# Patient Record
Sex: Female | Born: 1940 | Race: White | Hispanic: No | Marital: Married | Smoking: Never smoker
Health system: Southern US, Community
[De-identification: ages and names within clinical notes are randomized; demographics above are authoritative.]

## PROBLEM LIST (undated history)

## (undated) DIAGNOSIS — I10 Essential (primary) hypertension: Secondary | ICD-10-CM

## (undated) DIAGNOSIS — K219 Gastro-esophageal reflux disease without esophagitis: Secondary | ICD-10-CM

## (undated) DIAGNOSIS — D649 Anemia, unspecified: Secondary | ICD-10-CM

## (undated) HISTORY — DX: Gastro-esophageal reflux disease without esophagitis: K21.9

## (undated) HISTORY — PX: PARATHYROIDECTOMY: SHX19

## (undated) HISTORY — DX: Essential (primary) hypertension: I10

## (undated) HISTORY — DX: Anemia, unspecified: D64.9

---

## 2006-05-19 ENCOUNTER — Encounter: Payer: Self-pay | Admitting: Family Medicine

## 2006-05-23 ENCOUNTER — Encounter: Payer: Self-pay | Admitting: Family Medicine

## 2006-06-19 ENCOUNTER — Ambulatory Visit: Payer: Self-pay | Admitting: Gastroenterology

## 2006-06-26 ENCOUNTER — Encounter: Payer: Self-pay | Admitting: Family Medicine

## 2007-01-16 ENCOUNTER — Encounter: Payer: Self-pay | Admitting: Family Medicine

## 2007-03-27 ENCOUNTER — Ambulatory Visit: Payer: Self-pay | Admitting: Oncology

## 2007-04-03 ENCOUNTER — Encounter (HOSPITAL_COMMUNITY): Admission: RE | Admit: 2007-04-03 | Discharge: 2007-07-02 | Payer: Self-pay | Admitting: Oncology

## 2007-04-03 LAB — CBC WITH DIFFERENTIAL (CANCER CENTER ONLY)
BASO#: 0 10*3/uL (ref 0.0–0.2)
Eosinophils Absolute: 0.1 10*3/uL (ref 0.0–0.5)
HCT: 29.6 % — ABNORMAL LOW (ref 34.8–46.6)
HGB: 9.4 g/dL — ABNORMAL LOW (ref 11.6–15.9)
LYMPH%: 20 % (ref 14.0–48.0)
MCH: 23.3 pg — ABNORMAL LOW (ref 26.0–34.0)
MCV: 74 fL — ABNORMAL LOW (ref 81–101)
MONO#: 0.7 10*3/uL (ref 0.1–0.9)
MONO%: 8.3 % (ref 0.0–13.0)
RBC: 4.01 10*6/uL (ref 3.70–5.32)
WBC: 8.1 10*3/uL (ref 3.9–10.0)

## 2007-04-03 LAB — MORPHOLOGY - CHCC SATELLITE: PLT EST ~~LOC~~: INCREASED

## 2007-04-04 LAB — COMPREHENSIVE METABOLIC PANEL
ALT: 15 U/L (ref 0–35)
AST: 21 U/L (ref 0–37)
Calcium: 10.3 mg/dL (ref 8.4–10.5)
Chloride: 107 mEq/L (ref 96–112)
Creatinine, Ser: 0.76 mg/dL (ref 0.40–1.20)
Potassium: 4.8 mEq/L (ref 3.5–5.3)
Sodium: 137 mEq/L (ref 135–145)
Total Protein: 6.6 g/dL (ref 6.0–8.3)

## 2007-04-04 LAB — RETICULOCYTES (CHCC)
ABS Retic: 82.8 10*3/uL (ref 19.0–186.0)
RBC.: 4.14 MIL/uL (ref 3.87–5.11)

## 2007-04-04 LAB — ERYTHROPOIETIN: Erythropoietin: 181 m[IU]/mL — ABNORMAL HIGH (ref 2.6–34.0)

## 2007-04-04 LAB — VITAMIN B12: Vitamin B-12: 379 pg/mL (ref 211–911)

## 2007-04-04 LAB — IRON AND TIBC: TIBC: 526 ug/dL — ABNORMAL HIGH (ref 250–470)

## 2007-04-04 LAB — FERRITIN: Ferritin: 2 ng/mL — ABNORMAL LOW (ref 10–291)

## 2007-04-11 LAB — CBC WITH DIFFERENTIAL (CANCER CENTER ONLY)
Eosinophils Absolute: 0.2 10*3/uL (ref 0.0–0.5)
HCT: 31.5 % — ABNORMAL LOW (ref 34.8–46.6)
LYMPH%: 19.1 % (ref 14.0–48.0)
MCH: 24.2 pg — ABNORMAL LOW (ref 26.0–34.0)
MCV: 76 fL — ABNORMAL LOW (ref 81–101)
MONO#: 0.5 10*3/uL (ref 0.1–0.9)
MONO%: 7.3 % (ref 0.0–13.0)
NEUT%: 70.2 % (ref 39.6–80.0)
Platelets: 426 10*3/uL — ABNORMAL HIGH (ref 145–400)
RDW: 16.8 % — ABNORMAL HIGH (ref 10.5–14.6)
WBC: 6.9 10*3/uL (ref 3.9–10.0)

## 2007-05-16 ENCOUNTER — Ambulatory Visit: Payer: Self-pay | Admitting: Oncology

## 2007-05-17 LAB — CBC WITH DIFFERENTIAL (CANCER CENTER ONLY)
BASO#: 0.1 10*3/uL (ref 0.0–0.2)
EOS%: 1.8 % (ref 0.0–7.0)
HCT: 23 % — ABNORMAL LOW (ref 34.8–46.6)
HGB: 7.2 g/dL — ABNORMAL LOW (ref 11.6–15.9)
LYMPH#: 1.4 10*3/uL (ref 0.9–3.3)
MCHC: 31.4 g/dL — ABNORMAL LOW (ref 32.0–36.0)
MONO#: 1 10*3/uL — ABNORMAL HIGH (ref 0.1–0.9)
NEUT#: 11.6 10*3/uL — ABNORMAL HIGH (ref 1.5–6.5)
RBC: 2.81 10*6/uL — ABNORMAL LOW (ref 3.70–5.32)
WBC: 14.4 10*3/uL — ABNORMAL HIGH (ref 3.9–10.0)

## 2007-05-18 LAB — MANUAL DIFFERENTIAL (CHCC SATELLITE)
ANC (CHCC HP manual diff): 12.4 10*3/uL — ABNORMAL HIGH (ref 1.5–6.7)
LYMPH: 6 % — ABNORMAL LOW (ref 14–48)
MONO: 8 % (ref 0–13)

## 2007-05-21 LAB — TYPE & CROSSMATCH - CHCC SATELLITE

## 2007-05-25 ENCOUNTER — Encounter: Payer: Self-pay | Admitting: Family Medicine

## 2007-06-08 ENCOUNTER — Ambulatory Visit: Payer: Self-pay | Admitting: Orthopedic Surgery

## 2007-07-03 ENCOUNTER — Ambulatory Visit: Payer: Self-pay | Admitting: Oncology

## 2007-07-04 LAB — CBC WITH DIFFERENTIAL (CANCER CENTER ONLY)
EOS%: 2.1 % (ref 0.0–7.0)
LYMPH%: 20 % (ref 14.0–48.0)
MCH: 26.6 pg (ref 26.0–34.0)
MCHC: 31.9 g/dL — ABNORMAL LOW (ref 32.0–36.0)
MONO%: 6.8 % (ref 0.0–13.0)
NEUT#: 4.6 10*3/uL (ref 1.5–6.5)
Platelets: 435 10*3/uL — ABNORMAL HIGH (ref 145–400)
RDW: 14 % (ref 10.5–14.6)

## 2007-07-04 LAB — COMPREHENSIVE METABOLIC PANEL
AST: 12 U/L (ref 0–37)
Alkaline Phosphatase: 81 U/L (ref 39–117)
BUN: 11 mg/dL (ref 6–23)
CO2: 21 mEq/L (ref 19–32)
Chloride: 109 mEq/L (ref 96–112)
Creatinine, Ser: 0.71 mg/dL (ref 0.40–1.20)
Glucose, Bld: 144 mg/dL — ABNORMAL HIGH (ref 70–99)
Sodium: 143 mEq/L (ref 135–145)
Total Bilirubin: 0.2 mg/dL — ABNORMAL LOW (ref 0.3–1.2)

## 2007-07-04 LAB — IRON AND TIBC
%SAT: 4 % — ABNORMAL LOW (ref 20–55)
Iron: 18 ug/dL — ABNORMAL LOW (ref 42–145)
TIBC: 410 ug/dL (ref 250–470)
UIBC: 392 ug/dL

## 2007-07-04 LAB — RETICULOCYTES (CHCC)
ABS Retic: 65.8 10*3/uL (ref 19.0–186.0)
RBC.: 3.87 MIL/uL (ref 3.87–5.11)

## 2007-07-13 ENCOUNTER — Ambulatory Visit (HOSPITAL_COMMUNITY): Admission: RE | Admit: 2007-07-13 | Discharge: 2007-07-13 | Payer: Self-pay | Admitting: Oncology

## 2007-07-13 ENCOUNTER — Encounter (INDEPENDENT_AMBULATORY_CARE_PROVIDER_SITE_OTHER): Payer: Self-pay | Admitting: Interventional Radiology

## 2007-08-02 LAB — CBC WITH DIFFERENTIAL (CANCER CENTER ONLY)
BASO%: 0.3 % (ref 0.0–2.0)
EOS%: 3.1 % (ref 0.0–7.0)
HCT: 27.7 % — ABNORMAL LOW (ref 34.8–46.6)
LYMPH%: 20.8 % (ref 14.0–48.0)
MCHC: 31.1 g/dL — ABNORMAL LOW (ref 32.0–36.0)
MCV: 74 fL — ABNORMAL LOW (ref 81–101)
MONO%: 5.8 % (ref 0.0–13.0)
NEUT%: 70 % (ref 39.6–80.0)
Platelets: 448 10*3/uL — ABNORMAL HIGH (ref 145–400)
RDW: 17.1 % — ABNORMAL HIGH (ref 10.5–14.6)
WBC: 4.8 10*3/uL (ref 3.9–10.0)

## 2007-08-03 LAB — IRON AND TIBC
TIBC: 451 ug/dL (ref 250–470)
UIBC: 441 ug/dL

## 2007-08-03 LAB — RETICULOCYTES (CHCC)
ABS Retic: 56.3 10*3/uL (ref 19.0–186.0)
RBC.: 3.75 MIL/uL — ABNORMAL LOW (ref 3.87–5.11)
Retic Ct Pct: 1.5 % (ref 0.4–3.1)

## 2007-08-06 LAB — SPEP & IFE WITH QIG
Albumin ELP: 58 % (ref 55.8–66.1)
Alpha-2-Globulin: 10.9 % (ref 7.1–11.8)
Beta Globulin: 9.1 % — ABNORMAL HIGH (ref 4.7–7.2)
IgG (Immunoglobin G), Serum: 646 mg/dL — ABNORMAL LOW (ref 694–1618)
Total Protein, Serum Electrophoresis: 6.8 g/dL (ref 6.0–8.3)

## 2007-08-22 ENCOUNTER — Ambulatory Visit: Payer: Self-pay | Admitting: Oncology

## 2007-08-30 LAB — RETICULOCYTES (CHCC)
ABS Retic: 152.9 10*3/uL (ref 19.0–186.0)
RBC.: 3.92 MIL/uL (ref 3.87–5.11)
Retic Ct Pct: 3.9 % — ABNORMAL HIGH (ref 0.4–3.1)

## 2007-08-30 LAB — CBC WITH DIFFERENTIAL (CANCER CENTER ONLY)
BASO%: 0.4 % (ref 0.0–2.0)
EOS%: 2.1 % (ref 0.0–7.0)
LYMPH#: 0.9 10*3/uL (ref 0.9–3.3)
MCHC: 30.6 g/dL — ABNORMAL LOW (ref 32.0–36.0)
MONO#: 0.3 10*3/uL (ref 0.1–0.9)
NEUT#: 6.2 10*3/uL (ref 1.5–6.5)
NEUT%: 81.2 % — ABNORMAL HIGH (ref 39.6–80.0)
Platelets: 527 10*3/uL — ABNORMAL HIGH (ref 145–400)
RDW: 20 % — ABNORMAL HIGH (ref 10.5–14.6)
WBC: 7.6 10*3/uL (ref 3.9–10.0)

## 2007-08-30 LAB — COMPREHENSIVE METABOLIC PANEL
Albumin: 3.9 g/dL (ref 3.5–5.2)
BUN: 12 mg/dL (ref 6–23)
CO2: 20 mEq/L (ref 19–32)
Glucose, Bld: 176 mg/dL — ABNORMAL HIGH (ref 70–99)
Potassium: 4.4 mEq/L (ref 3.5–5.3)
Sodium: 142 mEq/L (ref 135–145)
Total Bilirubin: 0.3 mg/dL (ref 0.3–1.2)
Total Protein: 6.8 g/dL (ref 6.0–8.3)

## 2007-09-06 ENCOUNTER — Ambulatory Visit: Payer: Self-pay | Admitting: Gastroenterology

## 2007-09-06 ENCOUNTER — Encounter: Payer: Self-pay | Admitting: Family Medicine

## 2007-09-27 LAB — CBC WITH DIFFERENTIAL (CANCER CENTER ONLY)
BASO%: 0.6 % (ref 0.0–2.0)
Eosinophils Absolute: 0.1 10*3/uL (ref 0.0–0.5)
MONO#: 0.5 10*3/uL (ref 0.1–0.9)
MONO%: 8.7 % (ref 0.0–13.0)
NEUT#: 4.2 10*3/uL (ref 1.5–6.5)
Platelets: 369 10*3/uL (ref 145–400)
RBC: 4.51 10*6/uL (ref 3.70–5.32)
RDW: 21.6 % — ABNORMAL HIGH (ref 10.5–14.6)
WBC: 6 10*3/uL (ref 3.9–10.0)

## 2007-09-28 LAB — IRON AND TIBC
%SAT: 17 % — ABNORMAL LOW (ref 20–55)
Iron: 62 ug/dL (ref 42–145)
TIBC: 368 ug/dL (ref 250–470)
UIBC: 306 ug/dL

## 2007-09-28 LAB — FERRITIN: Ferritin: 211 ng/mL (ref 10–291)

## 2007-10-31 ENCOUNTER — Ambulatory Visit: Payer: Self-pay | Admitting: Oncology

## 2007-11-01 LAB — CBC WITH DIFFERENTIAL (CANCER CENTER ONLY)
BASO%: 0.5 % (ref 0.0–2.0)
Eosinophils Absolute: 0.1 10*3/uL (ref 0.0–0.5)
LYMPH#: 1.2 10*3/uL (ref 0.9–3.3)
LYMPH%: 27.5 % (ref 14.0–48.0)
MCV: 84 fL (ref 81–101)
MONO#: 0.4 10*3/uL (ref 0.1–0.9)
Platelets: 281 10*3/uL (ref 145–400)
RBC: 5.05 10*6/uL (ref 3.70–5.32)
RDW: 15.7 % — ABNORMAL HIGH (ref 10.5–14.6)
WBC: 4.2 10*3/uL (ref 3.9–10.0)

## 2007-11-01 LAB — IRON AND TIBC
Iron: 72 ug/dL (ref 42–145)
UIBC: 310 ug/dL

## 2008-02-07 ENCOUNTER — Ambulatory Visit: Payer: Self-pay | Admitting: Oncology

## 2008-02-13 LAB — CBC WITH DIFFERENTIAL (CANCER CENTER ONLY)
BASO#: 0.1 10*3/uL (ref 0.0–0.2)
Eosinophils Absolute: 0.2 10*3/uL (ref 0.0–0.5)
HGB: 14.7 g/dL (ref 11.6–15.9)
LYMPH#: 1.6 10*3/uL (ref 0.9–3.3)
MCH: 29.9 pg (ref 26.0–34.0)
MONO#: 0.5 10*3/uL (ref 0.1–0.9)
NEUT#: 4.7 10*3/uL (ref 1.5–6.5)
Platelets: 284 10*3/uL (ref 145–400)
RBC: 4.9 10*6/uL (ref 3.70–5.32)
WBC: 7.1 10*3/uL (ref 3.9–10.0)

## 2008-02-13 LAB — IRON AND TIBC
%SAT: 22 % (ref 20–55)
TIBC: 349 ug/dL (ref 250–470)
UIBC: 271 ug/dL

## 2008-02-13 LAB — FERRITIN: Ferritin: 61 ng/mL (ref 10–291)

## 2008-08-08 ENCOUNTER — Ambulatory Visit: Payer: Self-pay | Admitting: Oncology

## 2008-08-11 LAB — CBC WITH DIFFERENTIAL (CANCER CENTER ONLY)
BASO#: 0.1 10*3/uL (ref 0.0–0.2)
BASO%: 0.9 % (ref 0.0–2.0)
EOS%: 1.5 % (ref 0.0–7.0)
HGB: 15.8 g/dL (ref 11.6–15.9)
LYMPH#: 1.6 10*3/uL (ref 0.9–3.3)
MCHC: 33.6 g/dL (ref 32.0–36.0)
NEUT#: 6.3 10*3/uL (ref 1.5–6.5)
Platelets: 285 10*3/uL (ref 145–400)

## 2008-08-11 LAB — IRON AND TIBC: Iron: 117 ug/dL (ref 42–145)

## 2009-02-10 ENCOUNTER — Ambulatory Visit: Payer: Self-pay | Admitting: Oncology

## 2009-03-18 LAB — CBC WITH DIFFERENTIAL (CANCER CENTER ONLY)
BASO%: 0.9 % (ref 0.0–2.0)
EOS%: 1.6 % (ref 0.0–7.0)
HCT: 46.1 % (ref 34.8–46.6)
LYMPH#: 1.1 10*3/uL (ref 0.9–3.3)
MCHC: 33.2 g/dL (ref 32.0–36.0)
MONO%: 4.2 % (ref 0.0–13.0)
NEUT#: 6.2 10*3/uL (ref 1.5–6.5)
NEUT%: 79.3 % (ref 39.6–80.0)
RDW: 11.5 % (ref 10.5–14.6)

## 2009-03-18 LAB — IRON AND TIBC
%SAT: 19 % — ABNORMAL LOW (ref 20–55)
Iron: 68 ug/dL (ref 42–145)
TIBC: 351 ug/dL (ref 250–470)
UIBC: 283 ug/dL

## 2009-03-18 LAB — FERRITIN: Ferritin: 56 ng/mL (ref 10–291)

## 2009-09-22 ENCOUNTER — Ambulatory Visit: Payer: Self-pay | Admitting: Oncology

## 2009-09-23 LAB — CBC WITH DIFFERENTIAL (CANCER CENTER ONLY)
BASO%: 1.8 % (ref 0.0–2.0)
EOS%: 1.8 % (ref 0.0–7.0)
HCT: 47.5 % — ABNORMAL HIGH (ref 34.8–46.6)
LYMPH%: 22.7 % (ref 14.0–48.0)
MCH: 30.3 pg (ref 26.0–34.0)
MCHC: 33.1 g/dL (ref 32.0–36.0)
MCV: 91 fL (ref 81–101)
NEUT%: 65.8 % (ref 39.6–80.0)
RDW: 12 % (ref 10.5–14.6)

## 2010-03-18 ENCOUNTER — Ambulatory Visit: Payer: Self-pay | Admitting: Oncology

## 2010-05-06 ENCOUNTER — Ambulatory Visit: Payer: Self-pay | Admitting: Oncology

## 2010-08-17 ENCOUNTER — Ambulatory Visit: Payer: Self-pay | Admitting: Oncology

## 2010-08-24 ENCOUNTER — Encounter: Payer: Self-pay | Admitting: Family Medicine

## 2010-08-24 LAB — CBC WITH DIFFERENTIAL (CANCER CENTER ONLY)
BASO%: 0.7 % (ref 0.0–2.0)
Eosinophils Absolute: 0.2 10*3/uL (ref 0.0–0.5)
HCT: 32.5 % — ABNORMAL LOW (ref 34.8–46.6)
LYMPH#: 1.6 10*3/uL (ref 0.9–3.3)
MCV: 90 fL (ref 81–101)
MONO#: 0.8 10*3/uL (ref 0.1–0.9)
Platelets: 360 10*3/uL (ref 145–400)
RBC: 3.63 10*6/uL — ABNORMAL LOW (ref 3.70–5.32)
RDW: 12.8 % (ref 10.5–14.6)
WBC: 9.8 10*3/uL (ref 3.9–10.0)

## 2010-08-24 LAB — FERRITIN: Ferritin: 78 ng/mL (ref 10–291)

## 2010-08-24 LAB — IRON AND TIBC
%SAT: 7 % — ABNORMAL LOW (ref 20–55)
Iron: 29 ug/dL — ABNORMAL LOW (ref 42–145)
TIBC: 388 ug/dL (ref 250–470)

## 2010-08-26 ENCOUNTER — Ambulatory Visit: Payer: Self-pay | Admitting: Family Medicine

## 2010-08-26 DIAGNOSIS — D509 Iron deficiency anemia, unspecified: Secondary | ICD-10-CM

## 2010-08-26 DIAGNOSIS — K219 Gastro-esophageal reflux disease without esophagitis: Secondary | ICD-10-CM

## 2010-08-26 DIAGNOSIS — D500 Iron deficiency anemia secondary to blood loss (chronic): Secondary | ICD-10-CM | POA: Insufficient documentation

## 2010-08-26 DIAGNOSIS — F339 Major depressive disorder, recurrent, unspecified: Secondary | ICD-10-CM | POA: Insufficient documentation

## 2010-10-13 ENCOUNTER — Ambulatory Visit: Payer: Self-pay | Admitting: Family Medicine

## 2010-10-14 ENCOUNTER — Encounter: Payer: Self-pay | Admitting: Family Medicine

## 2010-10-14 LAB — CONVERTED CEMR LAB
CO2: 26 meq/L (ref 19–32)
Calcium: 9.8 mg/dL (ref 8.4–10.5)
Chloride: 102 meq/L (ref 96–112)
Cholesterol: 234 mg/dL — ABNORMAL HIGH (ref 0–200)
Creatinine, Ser: 0.7 mg/dL (ref 0.4–1.2)
Direct LDL: 139.3 mg/dL
Sodium: 136 meq/L (ref 135–145)
Total CHOL/HDL Ratio: 3
VLDL: 40.2 mg/dL — ABNORMAL HIGH (ref 0.0–40.0)

## 2010-10-18 ENCOUNTER — Telehealth: Payer: Self-pay | Admitting: Family Medicine

## 2010-12-24 ENCOUNTER — Ambulatory Visit: Payer: Self-pay | Admitting: Oncology

## 2011-01-18 NOTE — Assessment & Plan Note (Signed)
Summary: NEW PT TO EST/CLE  R/S FROM 08/19/10   Vital Signs:  Patient profile:   70 year old female Height:      59 inches Weight:      200 pounds BMI:     40.54 Temp:     97.8 degrees F oral Pulse rate:   76 / minute Pulse rhythm:   regular BP sitting:   130 / 80  (left arm) Cuff size:   large  Vitals Entered By: Linde Gillis CMA Duncan Dull) Sep 15, 2010 1:57 PM) CC: new patient, establish care   History of Present Illness: 70 yo here to establish care.  iron deficiency anemia- seeing Dr. Park Breed (hematology), does not have those records with her today but she brings in CBC from yesterday which shows a hgb of 11.1 with MCV of 90.  Per pt, has been much, much lower.  She feels much better.  She is currently taking iron supplementation.  depression- has h/o depression.  Pt is a widowed.  Previously on Celexa after her husband died, now not taking anything.  Feels she is much better now, in "the [facility]."  GERD- had recent upper and lower GI, does have have those records today.  Had full GI workup once she was diagnosed with iron deficieny.  Per pt, iron deficiecny due to bleeding ulcers.  Well woman- due for colonscopy, flu shot, FLP.  Preventive Screening-Counseling & Management  Alcohol-Tobacco     Smoking Status: never      Drug Use:  no.    Current Medications (verified): 1)  Tandem 162-115.2 Mg Caps (Ferrous Fum-Iron Polysacch) .... Take One Tablet By Mouth Daily 2)  Prilosec 20 Mg Cpdr (Omeprazole) .... Take One Tablet By Mouth Daily 3)  Multivitamins  Caps (Multiple Vitamin) .... Take One Tablet By Mouth Daily 4)  Fluocinolone Acetonide 0.01 % Crea (Fluocinolone Acetonide) .... Apply To Area Two Times A Day As Needed Itching.  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 Eyes:  Denies blurring. ENT:  Denies difficulty swallowing. CV:  Denies chest pain or discomfort. Resp:  Denies shortness of breath. GI:  Denies abdominal pain and change in bowel habits. GU:  Denies urinary frequency and urinary hesitancy. MS:  Denies joint pain, joint redness, and joint swelling. Derm:  Denies rash. Neuro:  Denies headaches. Psych:  Denies anxiety, depression, mental problems, panic attacks, sense of great danger, suicidal thoughts/plans, thoughts of violence, unusual visions or sounds, and thoughts /plans of harming others. Endo:  Denies cold intolerance and heat intolerance. Heme:  Denies abnormal bruising, fevers, pallor, and skin discoloration.  Physical Exam  General:  alert, well-developed, and well-nourished.   Head:  normocephalic and atraumatic.   Eyes:  vision grossly intact, pupils equal, pupils round, and pupils reactive to light.   Ears:  R ear normal and L ear normal.  Nose:  no external deformity.   Mouth:  good dentition.   Lungs:  Normal respiratory effort, chest expands symmetrically. Lungs are clear to auscultation, no crackles or wheezes. Heart:  Normal rate and regular rhythm. S1 and S2 normal without gallop, murmur, click, rub or other extra sounds. Abdomen:  Bowel sounds positive,abdomen soft and non-tender without masses, organomegaly or hernias noted. Msk:  No deformity or scoliosis noted of thoracic or lumbar spine.   Extremities:  No clubbing, cyanosis, edema, or deformity noted with  normal full range of motion of all joints.   Neurologic:  alert & oriented X3 and gait normal.   Skin:  Intact without suspicious lesions or rashes Psych:  Cognition and judgment appear intact. Alert and cooperative with normal attention span and concentration. No apparent delusions, illusions, hallucinations   Impression & Recommendations:  Problem # 1:  ANEMIA-IRON DEFICIENCY (ICD-280.9) Assessment Unchanged Will need to review records from Dr. Welton Flakes.  CBC stable, will scan into EMR. Her updated medication list for this problem includes:    Tandem 162-115.2 Mg Caps (Ferrous fum-iron polysacch) .Marland Kitchen... Take one tablet by mouth daily  Problem # 2:  DEPRESSION (ICD-311) Assessment: Unchanged Stable off medication at this time.  Problem # 3:  GERD (ICD-530.81) Assessment: Unchanged Stable with Omeprazole, will review GI records once we receive them. Her updated medication list for this problem includes:    Prilosec 20 Mg Cpdr (Omeprazole) .Marland Kitchen... Take one tablet by mouth daily  Complete Medication List: 1)  Tandem 162-115.2 Mg Caps (Ferrous fum-iron polysacch) .... Take one tablet by mouth daily 2)  Prilosec 20 Mg Cpdr (Omeprazole) .... Take one tablet by mouth daily 3)  Multivitamins Caps (Multiple vitamin) .... Take one tablet by mouth daily 4)  Fluocinolone Acetonide 0.01 % Crea (Fluocinolone acetonide) .... Apply to area two times a day as needed itching.  Other Orders: Radiology Referral (Radiology)  Patient Instructions: 1)  Great to meet you. 2)  Please make an appointment to see me one morning for labs and complete physical exam. 3)  Call your insurance company to see if they cover flu shots. Prescriptions: FLUOCINOLONE ACETONIDE 0.01 % CREA (FLUOCINOLONE ACETONIDE) Apply to area two times a day as needed itching.  #30 g x 0   Entered and Authorized by:   Ruthe Mannan MD   Signed by:   Ruthe Mannan MD on 08/26/2010   Method used:   Print then Give to Patient   RxID:    [id number]   Current Allergies (reviewed today): No known allergies   Prevention & Chronic Care Immunizations   Influenza vaccine: Not documented    Tetanus booster: 12/19/2001: historical   Tetanus booster due: 12/20/2011    Pneumococcal vaccine: historical  (09/12/2006)   Pneumococcal vaccine due: None    H. zoster vaccine: 09/12/2006: historical  Colorectal Screening   Hemoccult: Not documented    Colonoscopy: normal  (08/26/2008)   Colonoscopy due: 08/26/2018  Other Screening   Pap smear: Not documented   Pap smear due: Not Indicated    Mammogram: Not documented   Mammogram action/deferral: Ordered  (08/26/2010)   Mammogram due: 08/27/2011    DXA bone density scan: abnormal  (05/23/2006)   DXA scan due: None    Smoking status: never  (08/26/2010)  Lipids   Total Cholesterol: Not documented   LDL: Not documented   LDL Direct: Not documented   HDL: Not documented   Triglycerides: Not documented   Nursing Instructions: Schedule screening mammogram (see order)  TD Result Date:  12/19/2001 TD Result:  historical Pneumovax Result Date:  09/12/2006 Pneumovax Result:  historical Herpes Zoster Result Date:  09/12/2006 Herpes Zoster Result:  historical Colonoscopy Result Date:  08/26/2008 Colonoscopy Result:  normal PAP Next Due:  Not Indicated Mammogram Result Date:  08/26/2008 Mammogram Next Due:  1 yr Bone Density Result Date:  05/23/2006 Bone Density Result:  abnormal

## 2011-01-18 NOTE — Progress Notes (Signed)
Summary: wants cholesterol info mailed  Phone Note Call from Patient Call back at Home Phone [phone]   Caller: Patient Summary of Call: Advised pt of what her lab letter meant.  She is asking that we send diet information to her showing what she should and should not eat to get her trigs down. Initial call taken by: Lowella Petties CMA, AAMA,  October 18, 2010 9:42 AM  Follow-up for Phone Call        Cholesterol information mailed. Follow-up by: Linde Gillis CMA Duncan Dull),  October 18, 2010 10:02 AM

## 2011-01-18 NOTE — Assessment & Plan Note (Signed)
Summary: CHECK UP/MK   Vital Signs:  Patient profile:   70 year old female Height:      59 inches Weight:      149 pounds BMI:     30.20 Temp:     98.3 degrees F oral Pulse rate:   76 / minute Pulse rhythm:   regular BP sitting:   130 / 80  (right arm) Cuff size:   large  Vitals Entered By: Linde Gillis CMA Duncan Dull) (October 13, 2010 8:46 AM) CC: complete physicial   History of Present Illness: 70 yo here for CPX without complaints.    iron deficiency anemia- seeing Dr. Park Breed (hematology),She is currently taking iron supplementation and feels good.  Denies any fatigue, SOB, CP or LE edema.    depression- has h/o depression.  Pt is a widowed.  Previously on Celexa after her husband died, now not taking anything.  Feels she is much better now, in "the [facility] hands."   Well woman- due for bone density, flu shot, FLP.  Scheduled for mammogram tomorrow.  Current Medications (verified): 1)  Tandem 162-115.2 Mg Caps (Ferrous Fum-Iron Polysacch) .... Take One Tablet By Mouth Daily 2)  Prilosec 20 Mg Cpdr (Omeprazole) .... Take One Tablet By Mouth Daily 3)  Multivitamins  Caps (Multiple Vitamin) .... Take One Tablet By Mouth Daily 4)  Fluocinolone Acetonide 0.01 % Crea (Fluocinolone Acetonide) .... Apply To Area Two Times A Day As Needed Itching.  Allergies (verified): No Known Drug Allergies  Past History:  Past Medical History: Last updated: Sep 15, 2010 Anemia-iron deficiency Depression GERD  Past Surgical History: Last updated: 09-15-10 Denies surgical history  Family History: Last updated: 2010/09/15 Mom - died of CVA, DM sister had MI at 27 Dad-  IDDM  Social History: Last updated: 2010-09-15 Retired Widow/Widower Never Smoked Alcohol use-no Drug use-no  Risk Factors: Smoking Status: never (Sep 15, 2010)  Review of Systems      See HPI General:  Denies malaise. Eyes:  Denies blurring. ENT:  Denies difficulty swallowing. CV:  Denies chest pain or  discomfort. Resp:  Denies shortness of breath. GI:  Denies abdominal pain, bloody stools, and change in bowel habits. GU:  Denies dysuria. MS:  Denies joint pain, joint redness, and joint swelling. Derm:  Denies rash. Neuro:  Denies headaches. Psych:  Denies anxiety and depression. Endo:  Denies cold intolerance and heat intolerance. Heme:  Denies abnormal bruising and bleeding.  Physical Exam  General:  alert, well-developed, and well-nourished.   Head:  normocephalic and atraumatic.   Eyes:  vision grossly intact, pupils equal, pupils round, and pupils reactive to light.   Ears:  R ear normal and L ear normal.   Nose:  no external deformity.   Mouth:  good dentition.   Neck:  No deformities, masses, or tenderness noted. Lungs:  Normal respiratory effort, chest expands symmetrically. Lungs are clear to auscultation, no crackles or wheezes. Heart:  Normal rate and regular rhythm. S1 and S2 normal without gallop, murmur, click, rub or other extra sounds. Abdomen:  Bowel sounds positive,abdomen soft and non-tender without masses, organomegaly or hernias noted. Msk:  No deformity or scoliosis noted of thoracic or lumbar spine.   Extremities:  No clubbing, cyanosis, edema, or deformity noted with normal full range of motion of all joints.   Neurologic:  alert & oriented X3 and gait normal.   Skin:  Intact without suspicious lesions or rashes Psych:  Cognition and judgment appear intact. Alert and cooperative with normal attention span and  concentration. No apparent delusions, illusions, hallucinations   Impression & Recommendations:  Problem # 1:  PREVENTIVE HEALTH CARE (ICD-V70.0) Reviewed preventive care protocols, scheduled due services, and updated immunizations Discussed nutrition, exercise, diet, and healthy lifestyle.  FLP, BMET today. Schedule DEXA scan. declined flu shot. Orders: Venipuncture ([id number]) TLB-BMP (Basic Metabolic Panel-BMET) (80048-METABOL)  Problem # 2:   DEPRESSION (ICD-311) Assessment: Unchanged Doing will without meds.    Problem # 3:  ANEMIA-IRON DEFICIENCY (ICD-280.9) Assessment: Unchanged stable, continue Tandem per Dr. Park Breed. Her updated medication list for this problem includes:    Tandem 162-115.2 Mg Caps (Ferrous fum-iron polysacch) .Marland Kitchen... Take one tablet by mouth daily  Complete Medication List: 1)  Tandem 162-115.2 Mg Caps (Ferrous fum-iron polysacch) .... Take one tablet by mouth daily 2)  Prilosec 20 Mg Cpdr (Omeprazole) .... Take one tablet by mouth daily 3)  Multivitamins Caps (Multiple vitamin) .... Take one tablet by mouth daily 4)  Fluocinolone Acetonide 0.01 % Crea (Fluocinolone acetonide) .... Apply to area two times a day as needed itching.  Other Orders: Radiology Referral (Radiology) TLB-Lipid Panel (80061-LIPID)  Patient Instructions: 1)  Please stop by to see Shirlee Limerick on your way out.   Orders Added: 1)  Radiology Referral [Radiology] 2)  TLB-Lipid Panel [80061-LIPID] 3)  Venipuncture [36415] 4)  TLB-BMP (Basic Metabolic Panel-BMET) [80048-METABOL] 5)  Est. Patient 65& > [23762]    Current Allergies (reviewed today): No known allergies   Mammogram Result Date:  10/14/2010  Prevention & Chronic Care Immunizations   Influenza vaccine: Not documented    Tetanus booster: 12/19/2001: historical   Tetanus booster due: 12/20/2011    Pneumococcal vaccine: historical  (09/12/2006)   Pneumococcal vaccine due: None    H. zoster vaccine: 09/12/2006: historical  Colorectal Screening   Hemoccult: Not documented    Colonoscopy: normal  (08/26/2008)   Colonoscopy action/deferral: GI referral  (10/13/2010)   Colonoscopy due: 08/26/2018  Other Screening   Pap smear: Not documented   Pap smear due: Not Indicated    Mammogram: Not documented   Mammogram action/deferral: Ordered  (08/26/2010)   Mammogram due: 08/27/2011    DXA bone density scan: abnormal  (05/23/2006)   DXA bone density  action/deferral: Ordered  (10/13/2010)   DXA scan due: None    Smoking status: never  (08/26/2010)  Lipids   Total Cholesterol: Not documented   Lipid panel action/deferral: Lipid Panel ordered   LDL: Not documented   LDL Direct: Not documented   HDL: Not documented   Triglycerides: Not documented   Nursing Instructions: GI referral for screening colonoscopy (see order) Schedule screening DXA bone density scan (see order)

## 2011-01-18 NOTE — Letter (Signed)
Summary: Generic Letter  [place] [facility]  [address] [place], [place] [address]   Phone: [phone]  Fax: [phone]    10/14/2010  Volanda Napoleon [address] [place], [place]  [address]  Dear Ms. Melanie Bird,    We have received your lab results and Dr. Dayton Martes says that overall your labs looked great.  Triglycerides are too high.  Weight loss (even small amount) can decrease triglycerides.  Decrease added sugars, eliminate trans fats, increase fiber and limit alcohol.  All these changes together can drop triglycerides by almost 50%.  Recheck in 1 year.  Enclosed is a copy of your lab results.       Sincerely,       Linde Gillis CMA (AAMA)for Dr. Ruthe Mannan

## 2011-01-18 NOTE — Letter (Signed)
Summary:  Cancer Center  [facility]   Imported By: Sherian Rein 09/04/2010 09:54:07  _____________________________________________________________________  External Attachment:    Type:   Image     Comment:   External Document

## 2011-10-03 LAB — CBC
HCT: 29.3 — ABNORMAL LOW
Hemoglobin: 9.2 — ABNORMAL LOW
MCV: 83
Platelets: 524 — ABNORMAL HIGH
RBC: 3.53 — ABNORMAL LOW
WBC: 7.4

## 2011-10-03 LAB — PROTIME-INR
INR: 1
Prothrombin Time: 12.9

## 2011-10-03 LAB — CHROMOSOME ANALYSIS, BONE MARROW

## 2011-10-03 LAB — BONE MARROW EXAM

## 2011-12-06 ENCOUNTER — Other Ambulatory Visit: Payer: Self-pay | Admitting: *Deleted

## 2011-12-06 NOTE — Telephone Encounter (Signed)
THIS REQUEST TO DR.KHAN'S NURSE, Garald Braver.

## 2011-12-08 ENCOUNTER — Encounter: Payer: Self-pay | Admitting: *Deleted

## 2011-12-26 ENCOUNTER — Other Ambulatory Visit: Payer: Self-pay | Admitting: Oncology

## 2011-12-26 DIAGNOSIS — D649 Anemia, unspecified: Secondary | ICD-10-CM

## 2011-12-27 ENCOUNTER — Telehealth: Payer: Self-pay | Admitting: *Deleted

## 2011-12-27 NOTE — Telephone Encounter (Signed)
left voice message to inform the patient of the new date and time on 01-12-2012 starting at 12:00pm with the labs

## 2012-01-11 ENCOUNTER — Encounter: Payer: Self-pay | Admitting: *Deleted

## 2012-01-12 ENCOUNTER — Telehealth: Payer: Self-pay | Admitting: Oncology

## 2012-01-12 ENCOUNTER — Other Ambulatory Visit: Payer: PRIVATE HEALTH INSURANCE | Admitting: Lab

## 2012-01-12 ENCOUNTER — Ambulatory Visit (HOSPITAL_BASED_OUTPATIENT_CLINIC_OR_DEPARTMENT_OTHER): Payer: Medicare Other | Admitting: Oncology

## 2012-01-12 ENCOUNTER — Encounter: Payer: Self-pay | Admitting: Oncology

## 2012-01-12 VITALS — BP 139/79 | HR 109 | Temp 98.4°F | Ht 59.5 in | Wt 202.5 lb

## 2012-01-12 DIAGNOSIS — D649 Anemia, unspecified: Secondary | ICD-10-CM

## 2012-01-12 DIAGNOSIS — D509 Iron deficiency anemia, unspecified: Secondary | ICD-10-CM

## 2012-01-12 DIAGNOSIS — K219 Gastro-esophageal reflux disease without esophagitis: Secondary | ICD-10-CM | POA: Diagnosis not present

## 2012-01-12 LAB — CBC WITH DIFFERENTIAL/PLATELET
Basophils Absolute: 0 10*3/uL (ref 0.0–0.1)
EOS%: 1 % (ref 0.0–7.0)
HCT: 33.7 % — ABNORMAL LOW (ref 34.8–46.6)
HGB: 10.6 g/dL — ABNORMAL LOW (ref 11.6–15.9)
LYMPH%: 13.2 % — ABNORMAL LOW (ref 14.0–49.7)
MCH: 28.3 pg (ref 25.1–34.0)
MCHC: 31.4 g/dL — ABNORMAL LOW (ref 31.5–36.0)
MCV: 90.1 fL (ref 79.5–101.0)
NEUT%: 78.9 % — ABNORMAL HIGH (ref 38.4–76.8)
Platelets: 485 10*3/uL — ABNORMAL HIGH (ref 145–400)
lymph#: 1.1 10*3/uL (ref 0.9–3.3)

## 2012-01-12 LAB — COMPREHENSIVE METABOLIC PANEL
AST: 33 U/L (ref 0–37)
BUN: 9 mg/dL (ref 6–23)
Calcium: 9.8 mg/dL (ref 8.4–10.5)
Chloride: 109 mEq/L (ref 96–112)
Creatinine, Ser: 0.6 mg/dL (ref 0.50–1.10)
Total Bilirubin: 0.3 mg/dL (ref 0.3–1.2)

## 2012-01-12 LAB — IRON AND TIBC
Iron: 267 ug/dL — ABNORMAL HIGH (ref 42–145)
UIBC: 177 ug/dL (ref 125–400)

## 2012-01-12 LAB — FERRITIN: Ferritin: 38 ng/mL (ref 10–291)

## 2012-01-12 NOTE — Telephone Encounter (Signed)
S/w tiffany from medical records and she is aware of the new pt referral for the pt to be transferred over to [facility]

## 2012-01-13 ENCOUNTER — Telehealth: Payer: Self-pay | Admitting: Oncology

## 2012-01-13 ENCOUNTER — Ambulatory Visit: Payer: Self-pay | Admitting: Internal Medicine

## 2012-01-13 NOTE — Telephone Encounter (Signed)
Pt appt. With Dr. Lorre Nick at [facility] 01/26/12 @ 2:00. Medical records faxed. Pt is aware. Dr. Madaline Guthrie on medical leave.

## 2012-01-15 NOTE — Progress Notes (Signed)
OFFICE PROGRESS NOTE  CC Melanie Mannan, MD, MD [address] [place] [address], [place] [address]  DIAGNOSIS: 71 yo female with iron deficiency anemia  PRIOR THERAPY: 1. Previous history of parenteral infusions of iron 2. Oral iron as needed  CURRENT THERAPY: patient was begun on oral tandam  INTERVAL HISTORY: Melanie Bird 71 y.o. female returns for follow up visit. Patient was last seen in September 2011. She has been lost follow up. She recently called stating that she was feeling very tired and fatigued,she thought that her blood count had gone down again. We got her started on oral iron. Since start of it she has been feeling better. With less weakness and fatigue. She has not had any bleeding. She denies headches or dizziness. She is accompanied by her daughter. Patient would like to have a referral to  cancer center for her future care. Remainder of 10 point review of systems is negative.  MEDICAL HISTORY: Past Medical History  Diagnosis Date  . Anemia     ALLERGIES:   has no known allergies.  MEDICATIONS:  Current Outpatient Prescriptions  Medication Sig Dispense Refill  . Calcium Carbonate-Vitamin D (CALCIUM-VITAMIN D) 500-200 MG-UNIT per tablet Take 1 tablet by mouth daily.      . ferrous fumarate-iron polysaccharide complex (TANDEM) 162-115.2 MG CAPS Take 1 capsule by mouth daily with breakfast.      . Multiple Vitamin (MULTIVITAMIN) tablet Take 1 tablet by mouth daily.      Marland Kitchen omeprazole (PRILOSEC) 20 MG capsule Take 20 mg by mouth daily.        SURGICAL HISTORY: History reviewed. No pertinent past surgical history.  REVIEW OF SYSTEMS:  Pertinent items are noted in HPI.   PHYSICAL EXAMINATION: General appearance: alert, cooperative and appears stated age Neck: no adenopathy, no carotid bruit, no JVD, supple, symmetrical, trachea midline and thyroid not enlarged, symmetric, no tenderness/mass/nodules Lymph nodes: Cervical, supraclavicular, and  axillary nodes normal. Resp: clear to auscultation bilaterally and normal percussion bilaterally Back: symmetric, no curvature. ROM normal. No CVA tenderness. Cardio: regular rate and rhythm, S1, S2 normal, no murmur, click, rub or gallop GI: soft, non-tender; bowel sounds normal; no masses,  no organomegaly Extremities: extremities normal, atraumatic, no cyanosis or edema Neurologic: Alert and oriented X 3, normal strength and tone. Normal symmetric reflexes. Normal coordination and gait  ECOG PERFORMANCE STATUS: 1 - Symptomatic but completely ambulatory  Blood pressure 139/79, pulse 109, temperature 98.4 F (36.9 C), temperature source Oral, height 4' 11.5" (1.511 m), weight 202 lb 8 oz (91.853 kg).  LABORATORY DATA: Lab Results  Component Value Date   WBC 8.1 01/12/2012   HGB 10.6* 01/12/2012   HCT 33.7* 01/12/2012   MCV 90.1 01/12/2012   PLT 485* 01/12/2012      Chemistry      Component Value Date/Time   NA 142 01/12/2012 1155   K 4.3 01/12/2012 1155   CL 109 01/12/2012 1155   CO2 20 01/12/2012 1155   BUN 9 01/12/2012 1155   CREATININE 0.60 01/12/2012 1155      Component Value Date/Time   CALCIUM 9.8 01/12/2012 1155   ALKPHOS 94 01/12/2012 1155   AST 33 01/12/2012 1155   ALT 30 01/12/2012 1155   BILITOT 0.3 01/12/2012 1155       RADIOGRAPHIC STUDIES:  No results found.  ASSESSMENT: 71 yo with:  1. Iron deficiency anemia 2. GERD   PLAN:  1. Continue oral iron 2. Referral to [facility] (referral done through [facility]) Patient prefers a female physician   All questions were answered. The patient knows to call the clinic with any problems, questions or concerns. We can certainly see the patient much sooner if necessary.  I spent 20 minutes counseling the patient face to face. The total time spent in the appointment was 30 minutes.    Drue Second, MD Medical/Oncology [facility] [phone] (beeper) [phone]  (Office)  01/15/2012, 3:39 PM

## 2012-01-26 ENCOUNTER — Ambulatory Visit: Payer: Self-pay | Admitting: Internal Medicine

## 2012-01-26 LAB — FERRITIN: Ferritin (ARMC): 53 ng/mL (ref 8–388)

## 2012-01-26 LAB — TSH: Thyroid Stimulating Horm: 1.72 u[IU]/mL

## 2012-01-26 LAB — T4, FREE: Free Thyroxine: 0.88 ng/dL (ref 0.76–1.46)

## 2012-02-13 ENCOUNTER — Encounter: Payer: Self-pay | Admitting: Family Medicine

## 2012-02-13 ENCOUNTER — Ambulatory Visit (INDEPENDENT_AMBULATORY_CARE_PROVIDER_SITE_OTHER): Payer: PRIVATE HEALTH INSURANCE | Admitting: Family Medicine

## 2012-02-13 VITALS — BP 144/86 | HR 108 | Temp 97.6°F | Wt 206.0 lb

## 2012-02-13 DIAGNOSIS — E669 Obesity, unspecified: Secondary | ICD-10-CM | POA: Insufficient documentation

## 2012-02-13 DIAGNOSIS — R635 Abnormal weight gain: Secondary | ICD-10-CM

## 2012-02-13 NOTE — Progress Notes (Signed)
71 yo here to discuss weight gain.  Has gained 56 pounds since 09/2010!  Has had a difficult year- lost her sister and son is getting a divorce. Tends to eat when she is sad and anxious. Would like to discuss ways that she can loose weight on her own. Has never tried any diet and does not exercise.   Patient Active Problem List  Diagnoses  . ANEMIA-IRON DEFICIENCY  . DEPRESSION  . GERD  . Weight gain  . Obesity   Past Medical History  Diagnosis Date  . Anemia    No past surgical history on file. History  Substance Use Topics  . Smoking status: Never Smoker   . Smokeless tobacco: Never Used  . Alcohol Use: No   No family history on file. No Known Allergies Current Outpatient Prescriptions on File Prior to Visit  Medication Sig Dispense Refill  . Calcium Carbonate-Vitamin D (CALCIUM-VITAMIN D) 500-200 MG-UNIT per tablet Take 1 tablet by mouth daily.      . ferrous fumarate-iron polysaccharide complex (TANDEM) 162-115.2 MG CAPS Take 1 capsule by mouth daily with breakfast.      . Multiple Vitamin (MULTIVITAMIN) tablet Take 1 tablet by mouth daily.      Marland Kitchen omeprazole (PRILOSEC) 20 MG capsule Take 20 mg by mouth daily.       The PMH, PSH, Social History, Family History, Medications, and allergies have been reviewed in [facility], and have been updated if relevant.  See HPI    Physical exam: BP 144/86  Pulse 108  Temp(Src) 97.6 F (36.4 C) (Oral)  Wt 206 lb (93.441 kg) Wt Readings from Last 3 Encounters:  02/13/12 206 lb (93.441 kg)  01/12/12 202 lb 8 oz (91.853 kg)  10/13/10 149 lb (67.586 kg)     General: alert, well-developed, and well-nourished.  Head: normocephalic and atraumatic.  Psych: Cognition and judgment appear intact. Alert and cooperative with normal attention span and concentration. No apparent delusions, illusions, hallucinations   Assessment and Plan: 1. Obesity  Amb ref to Medical Nutrition Therapy-MNT  Deteriorated. >25 min spent with face to face  with patient, >50% counseling and/or coordinating care. Discussed eat right diet, hand out given. Will also refer to nutritionist. The patient indicates understanding of these issues and agrees with the plan.

## 2012-02-13 NOTE — Patient Instructions (Signed)
Great to see you. Please stop by to see Marion on your way out.   

## 2012-02-14 ENCOUNTER — Other Ambulatory Visit: Payer: Self-pay | Admitting: *Deleted

## 2012-02-14 MED ORDER — OMEPRAZOLE 20 MG PO CPDR: 20.0000 mg | DELAYED_RELEASE_CAPSULE | Freq: Every day | ORAL | Status: DC

## 2012-02-14 MED ORDER — FERROUS FUM-IRON POLYSACCH 162-115.2 MG PO CAPS: 1.0000 | ORAL_CAPSULE | Freq: Every day | ORAL | Status: DC

## 2012-02-14 NOTE — Telephone Encounter (Signed)
Patient is requesting a Rx for Omeprazole.  She stated that she thinks she can get this Rx cheaper if she has a Rx for it.  Please advise.  [facility] [facility].

## 2012-02-16 LAB — CANCER CENTER HEMOGLOBIN: HGB: 12.5 g/dL (ref 12.0–16.0)

## 2012-02-17 ENCOUNTER — Ambulatory Visit: Payer: Self-pay | Admitting: Internal Medicine

## 2012-03-21 ENCOUNTER — Ambulatory Visit: Payer: Self-pay | Admitting: Internal Medicine

## 2012-03-26 ENCOUNTER — Other Ambulatory Visit: Payer: Self-pay

## 2012-03-26 NOTE — Telephone Encounter (Signed)
Pt called to get refills on Tandem and omprazole sent to [facility]. Advised pt to ck with [facility] that she should have refills on both meds.

## 2012-04-18 ENCOUNTER — Ambulatory Visit: Payer: Self-pay | Admitting: Internal Medicine

## 2012-07-02 ENCOUNTER — Ambulatory Visit: Payer: Self-pay | Admitting: Internal Medicine

## 2012-07-02 LAB — CANCER CENTER HEMOGLOBIN: HGB: 13.9 g/dL

## 2012-07-19 ENCOUNTER — Ambulatory Visit: Payer: Self-pay | Admitting: Internal Medicine

## 2012-10-11 ENCOUNTER — Other Ambulatory Visit: Payer: Self-pay | Admitting: Family Medicine

## 2012-12-19 ENCOUNTER — Ambulatory Visit: Payer: Self-pay | Admitting: Internal Medicine

## 2013-01-19 ENCOUNTER — Ambulatory Visit: Payer: Self-pay | Admitting: Internal Medicine

## 2013-02-16 ENCOUNTER — Emergency Department: Payer: Self-pay | Admitting: Internal Medicine

## 2013-02-16 DIAGNOSIS — K219 Gastro-esophageal reflux disease without esophagitis: Secondary | ICD-10-CM | POA: Diagnosis not present

## 2013-02-16 DIAGNOSIS — J069 Acute upper respiratory infection, unspecified: Secondary | ICD-10-CM | POA: Diagnosis not present

## 2013-02-16 DIAGNOSIS — Z79899 Other long term (current) drug therapy: Secondary | ICD-10-CM | POA: Diagnosis not present

## 2013-02-16 DIAGNOSIS — D649 Anemia, unspecified: Secondary | ICD-10-CM | POA: Diagnosis not present

## 2013-02-16 DIAGNOSIS — R05 Cough: Secondary | ICD-10-CM | POA: Diagnosis not present

## 2013-02-16 LAB — RAPID INFLUENZA A&B ANTIGENS

## 2013-02-19 ENCOUNTER — Ambulatory Visit: Payer: Self-pay | Admitting: Internal Medicine

## 2013-05-19 ENCOUNTER — Ambulatory Visit: Payer: Self-pay | Admitting: Internal Medicine

## 2013-05-19 DIAGNOSIS — D509 Iron deficiency anemia, unspecified: Secondary | ICD-10-CM | POA: Diagnosis not present

## 2013-05-19 DIAGNOSIS — Z79899 Other long term (current) drug therapy: Secondary | ICD-10-CM | POA: Diagnosis not present

## 2013-05-24 ENCOUNTER — Other Ambulatory Visit: Payer: Self-pay | Admitting: Family Medicine

## 2013-06-18 ENCOUNTER — Ambulatory Visit: Payer: Self-pay | Admitting: Internal Medicine

## 2013-06-18 DIAGNOSIS — D509 Iron deficiency anemia, unspecified: Secondary | ICD-10-CM | POA: Diagnosis not present

## 2013-06-18 DIAGNOSIS — Z79899 Other long term (current) drug therapy: Secondary | ICD-10-CM | POA: Diagnosis not present

## 2013-06-26 ENCOUNTER — Other Ambulatory Visit: Payer: Self-pay | Admitting: Family Medicine

## 2013-06-27 ENCOUNTER — Encounter: Payer: Self-pay | Admitting: Family Medicine

## 2013-06-27 ENCOUNTER — Ambulatory Visit (INDEPENDENT_AMBULATORY_CARE_PROVIDER_SITE_OTHER): Payer: Medicare Other | Admitting: Family Medicine

## 2013-06-27 VITALS — BP 142/90 | HR 72 | Temp 98.1°F | Wt 197.0 lb

## 2013-06-27 DIAGNOSIS — D509 Iron deficiency anemia, unspecified: Secondary | ICD-10-CM | POA: Diagnosis not present

## 2013-06-27 DIAGNOSIS — J069 Acute upper respiratory infection, unspecified: Secondary | ICD-10-CM | POA: Insufficient documentation

## 2013-06-27 MED ORDER — TANDEM PLUS 162-115.2-1 MG PO CAPS: ORAL_CAPSULE | ORAL | Status: DC

## 2013-06-27 NOTE — Progress Notes (Signed)
SUBJECTIVE:  Melanie Bird is a 72 y.o. female who complains of coryza, congestion and productive cough for 3 days. She denies a history of anorexia, chest pain, chills, dizziness, fatigue, fevers, myalgias, nausea, shortness of breath, sweats, vomiting, weakness and weight loss and denies a history of asthma. Patient denies smoke cigarettes.  She also has h/o iron deficiency anemia, now followed by Dr. Toma Deiters.  She would like for me to refill her Tandem today.  Patient Active Problem List   Diagnosis Date Noted  . Acute upper respiratory infections of unspecified site 06/27/2013  . Weight gain 02/13/2012  . Obesity 02/13/2012  . ANEMIA-IRON DEFICIENCY 08/26/2010  . DEPRESSION 08/26/2010  . GERD 08/26/2010   Past Medical History  Diagnosis Date  . Anemia    No past surgical history on file. History  Substance Use Topics  . Smoking status: Never Smoker   . Smokeless tobacco: Never Used  . Alcohol Use: No   No family history on file. No Known Allergies Current Outpatient Prescriptions on File Prior to Visit  Medication Sig Dispense Refill  . Calcium Carbonate-Vitamin D (CALCIUM-VITAMIN D) 500-200 MG-UNIT per tablet Take 1 tablet by mouth daily.      . Multiple Vitamin (MULTIVITAMIN) tablet Take 1 tablet by mouth daily.      Marland Kitchen omeprazole (PRILOSEC) 20 MG capsule Take 1 capsule (20 mg total) by mouth daily.  30 capsule  6  . Polysaccharide Iron Complex (FERREX 150 PO) Take by mouth as directed.       No current facility-administered medications on file prior to visit.   The PMH, PSH, Social History, Family History, Medications, and allergies have been reviewed in [facility], and have been updated if relevant.    OBJECTIVE: BP 142/90  Pulse 72  Temp(Src) 98.1 F (36.7 C)  Wt 197 lb (89.359 kg)  BMI 39.14 kg/m2  She appears well, vital signs are as noted. Ears normal.  Throat and pharynx normal.  Neck supple. No adenopathy in the neck. Nose is congested. Sinuses non tender. The chest  is clear, without wheezes or rales.  ASSESSMENT:  viral upper respiratory illness  PLAN: Symptomatic therapy suggested: push fluids, rest and return office visit prn if symptoms persist or worsen. Lack of antibiotic effectiveness discussed with her. Call or return to clinic prn if these symptoms worsen or fail to improve as anticipated.

## 2013-06-27 NOTE — Patient Instructions (Addendum)
Please try some Mucinex over the counter for next 2-3 days. If your symptoms progress, please let me know immediately.

## 2013-07-05 LAB — OCCULT BLOOD X 1 CARD TO LAB, STOOL
Occult Blood, Feces: NEGATIVE
Occult Blood, Feces: NEGATIVE

## 2013-07-19 ENCOUNTER — Ambulatory Visit: Payer: Self-pay | Admitting: Internal Medicine

## 2013-07-19 DIAGNOSIS — Z79899 Other long term (current) drug therapy: Secondary | ICD-10-CM | POA: Diagnosis not present

## 2013-07-19 DIAGNOSIS — D509 Iron deficiency anemia, unspecified: Secondary | ICD-10-CM | POA: Diagnosis not present

## 2013-07-24 DIAGNOSIS — D509 Iron deficiency anemia, unspecified: Secondary | ICD-10-CM | POA: Diagnosis not present

## 2013-07-24 DIAGNOSIS — Z79899 Other long term (current) drug therapy: Secondary | ICD-10-CM | POA: Diagnosis not present

## 2013-08-19 ENCOUNTER — Ambulatory Visit: Payer: Self-pay | Admitting: Family Medicine

## 2013-08-19 ENCOUNTER — Ambulatory Visit: Payer: Self-pay | Admitting: Internal Medicine

## 2013-09-04 ENCOUNTER — Other Ambulatory Visit: Payer: Self-pay | Admitting: *Deleted

## 2013-09-04 MED ORDER — TANDEM PLUS 162-115.2-1 MG PO CAPS: ORAL_CAPSULE | ORAL | Status: DC

## 2013-09-13 DIAGNOSIS — Z23 Encounter for immunization: Secondary | ICD-10-CM | POA: Diagnosis not present

## 2013-10-08 ENCOUNTER — Ambulatory Visit: Payer: Medicare Other | Admitting: Family Medicine

## 2013-10-16 ENCOUNTER — Encounter: Payer: Self-pay | Admitting: Internal Medicine

## 2013-10-16 ENCOUNTER — Ambulatory Visit (INDEPENDENT_AMBULATORY_CARE_PROVIDER_SITE_OTHER): Payer: Medicare Other | Admitting: Internal Medicine

## 2013-10-16 VITALS — BP 140/90 | HR 131 | Temp 98.8°F | Wt 190.5 lb

## 2013-10-16 DIAGNOSIS — D509 Iron deficiency anemia, unspecified: Secondary | ICD-10-CM

## 2013-10-16 NOTE — Patient Instructions (Signed)

## 2013-10-16 NOTE — Assessment & Plan Note (Signed)
Will check CBC and iron panel Will forward to Dr. Eloise Levels for review

## 2013-10-16 NOTE — Addendum Note (Signed)
Addended by: Alvina Chou on: 10/16/2013 03:58 PM   Modules accepted: Orders

## 2013-10-16 NOTE — Addendum Note (Signed)
Addended by: Alvina Chou on: 10/16/2013 03:29 PM   Modules accepted: Orders

## 2013-10-16 NOTE — Progress Notes (Signed)
Subjective:    Patient ID: Melanie Bird, female    DOB: [birth year], 72 y.o.   MRN: [id number]  HPI  Pt presents to the clinic today for a follow up. She does not know what she is following up for. She thought Dr. Eloise Levels had sent her over here for lab work. He is the provider her does her physicals and manages her iron deficiency anemia. She does reports intermittent fatigue, better some days than others. She denies chest pain or shortness of breath. She is taking Ferrex and Tandem Plus daily as prescribed. She would like her labs sent to Dr. Eloise Levels.  Review of Systems  Past Medical History  Diagnosis Date  . Anemia     Current Outpatient Prescriptions  Medication Sig Dispense Refill  . Calcium Carbonate-Vitamin D (CALCIUM-VITAMIN D) 500-200 MG-UNIT per tablet Take 1 tablet by mouth daily.      Marland Kitchen FeFum-FePo-FA-B Cmp-C-Zn-Mn-Cu (TANDEM PLUS) 162-115.2-1 MG CAPS Take one capsule by mouth daily  90 each  1  . Multiple Vitamin (MULTIVITAMIN) tablet Take 1 tablet by mouth daily.      Marland Kitchen omeprazole (PRILOSEC) 20 MG capsule Take 1 capsule (20 mg total) by mouth daily.  30 capsule  6  . Polysaccharide Iron Complex (FERREX 150 PO) Take by mouth as directed.       No current facility-administered medications for this visit.    No Known Allergies  History reviewed. No pertinent family history.  History   Social History  . Marital Status: Widowed    Spouse Name: N/A    Number of Children: N/A  . Years of Education: N/A   Occupational History  . Not on file.   Social History Main Topics  . Smoking status: Never Smoker   . Smokeless tobacco: Never Used  . Alcohol Use: No  . Drug Use: No  . Sexual Activity: Not Currently   Other Topics Concern  . Not on file   Social History Narrative  . No narrative on file     Constitutional: Pt reports fatigue. Denies fever, malaise, headache or abrupt weight changes.  Respiratory: Denies difficulty breathing, shortness of breath, cough or  sputum production.   Cardiovascular: Denies chest pain, chest tightness, palpitations or swelling in the hands or feet.   No other specific complaints in a complete review of systems (except as listed in HPI above).     Objective:   Physical Exam   BP 140/90  Pulse 131  Temp(Src) 98.8 F (37.1 C) (Oral)  Wt 190 lb 8 oz (86.41 kg)  BMI 37.85 kg/m2  SpO2 96% Wt Readings from Last 3 Encounters:  10/16/13 190 lb 8 oz (86.41 kg)  06/27/13 197 lb (89.359 kg)  02/13/12 206 lb (93.441 kg)    General: Appears her stated age, well developed, well nourished in NAD. Cardiovascular: Normal rate and rhythm. S1,S2 noted.  No murmur, rubs or gallops noted. No JVD or BLE edema. No carotid bruits noted. Pulmonary/Chest: Normal effort and positive vesicular breath sounds. No respiratory distress. No wheezes, rales or ronchi noted.  Abdomen: Soft and nontender. Normal bowel sounds, no bruits noted. No distention or masses noted.     BMET    Component Value Date/Time   NA 142 01/12/2012 1155   K 4.3 01/12/2012 1155   CL 109 01/12/2012 1155   CO2 20 01/12/2012 1155   GLUCOSE 92 01/12/2012 1155   BUN 9 01/12/2012 1155   CREATININE 0.60 01/12/2012 1155   CALCIUM 9.8 01/12/2012 1155  GFRNONAA 87.48 10/13/2010 0904    Lipid Panel     Component Value Date/Time   CHOL 234* 10/13/2010 0904   TRIG 201.0* 10/13/2010 0904   HDL 67.00 10/13/2010 0904   CHOLHDL 3 10/13/2010 0904   VLDL 40.2* 10/13/2010 0904    CBC    Component Value Date/Time   WBC 8.1 01/12/2012 1155   WBC 9.8 08/24/2010 1028   WBC 7.4 07/13/2007 1025   RBC 3.74 01/12/2012 1155   RBC 3.92 08/30/2007 1044   RBC 3.53* 07/13/2007 1025   HGB 10.6* 01/12/2012 1155   HGB 11.1* 08/24/2010 1028   HGB 9.2* 07/13/2007 1025   HCT 33.7* 01/12/2012 1155   HCT 32.5* 08/24/2010 1028   HCT 29.3* 07/13/2007 1025   PLT 485* 01/12/2012 1155   PLT 360 08/24/2010 1028   PLT 524* 07/13/2007 1025   MCV 90.1 01/12/2012 1155   MCV 90 08/24/2010 1028   MCV 83.0  07/13/2007 1025   MCH 28.3 01/12/2012 1155   MCH 30.6 08/24/2010 1028   MCHC 31.4* 01/12/2012 1155   MCHC 34.1 08/24/2010 1028   MCHC 31.3 07/13/2007 1025   RDW 15.3* 01/12/2012 1155   RDW 12.8 08/24/2010 1028   RDW 16.6* 07/13/2007 1025   LYMPHSABS 1.1 01/12/2012 1155   LYMPHSABS 1.6 08/24/2010 1028   MONOABS 0.5 01/12/2012 1155   EOSABS 0.1 01/12/2012 1155   EOSABS 0.2 08/24/2010 1028   BASOSABS 0.0 01/12/2012 1155   BASOSABS 0.1 08/24/2010 1028    Hgb A1C No results found for this basename: HGBA1C        Assessment & Plan:

## 2013-10-17 LAB — CBC
HCT: 44.2 % (ref 36.0–46.0)
Hemoglobin: 15 g/dL (ref 12.0–15.0)
MCHC: 33.9 g/dL (ref 30.0–36.0)
MCV: 89.7 fl (ref 78.0–100.0)
Platelets: 281 10*3/uL (ref 150.0–400.0)
RBC: 4.93 Mil/uL (ref 3.87–5.11)

## 2013-10-17 LAB — IBC PANEL
Iron: 127 ug/dL (ref 42–145)
Transferrin: 301.9 mg/dL (ref 212.0–360.0)

## 2013-10-30 DIAGNOSIS — L280 Lichen simplex chronicus: Secondary | ICD-10-CM | POA: Diagnosis not present

## 2013-11-06 NOTE — Telephone Encounter (Signed)
Pt called to ck on status on Tandem plus; spoke with [facility] and rx ready for p ick up pt advised.

## 2013-11-11 ENCOUNTER — Telehealth: Payer: Self-pay | Admitting: Family Medicine

## 2013-11-11 NOTE — Telephone Encounter (Signed)
Pt left vm requesting 1 day supply of medication.  She did not specify which medication she was referring to or why.  Called pt back, no answer.  Left vm to return call.

## 2013-11-12 ENCOUNTER — Telehealth: Payer: Self-pay | Admitting: Family Medicine

## 2013-11-12 NOTE — Telephone Encounter (Signed)
Pt requesting 90 day supply RX be faxed to [facility] at [phone].  Phone [phone].  ID #[id number]  Please call pt and inform pt once this has been done.

## 2013-11-12 NOTE — Telephone Encounter (Signed)
Which med? Prilosec?

## 2013-11-13 MED ORDER — TANDEM PLUS 162-115.2-1 MG PO CAPS: ORAL_CAPSULE | ORAL | Status: DC

## 2013-11-13 NOTE — Telephone Encounter (Signed)
Tandem Plus, sorry I thought it was checked off

## 2013-11-13 NOTE — Telephone Encounter (Signed)
Faxed to number below.

## 2013-11-13 NOTE — Addendum Note (Signed)
Addended by: Criselda Peaches B on: 11/13/2013 01:49 PM   Modules accepted: Orders

## 2013-12-02 NOTE — Telephone Encounter (Signed)
Pt called to ck on status of tandem plus; pt has not gotten rx from [facility]; called [facility] at [phone] spoke with Iris who said conflicts listed with med; not covered by med D; spoke with Jillian and Tandem plus is on manufacturer back order. Valli Glance will fax request for possible substitutes for med. Jillian not sure if substitutes would be covered under med D. Pt will call [facility] about cost of replacement meds. Pt said can get at local pharmacy for little over $13.00.

## 2013-12-03 NOTE — Telephone Encounter (Signed)
Pt spoke with [facility] and they have Tandem plus in stock and this month pt will get at [facility].pt wants to get substitute for 90 day supply at [facility]. Pt request cb.

## 2013-12-03 NOTE — Telephone Encounter (Signed)
Ok for 90 day supply.  

## 2013-12-06 MED ORDER — TANDEM PLUS 162-115.2-1 MG PO CAPS: ORAL_CAPSULE | ORAL | Status: DC

## 2013-12-06 NOTE — Addendum Note (Signed)
Addended by: Roena Malady on: 12/06/2013 04:25 PM   Modules accepted: Orders

## 2013-12-06 NOTE — Telephone Encounter (Signed)
Left message on voicemail for pt to return call, Rx sent to [facility]

## 2013-12-06 NOTE — Telephone Encounter (Signed)
Rx sent through [facility]-scribe  

## 2014-04-02 ENCOUNTER — Other Ambulatory Visit: Payer: Self-pay | Admitting: Family Medicine

## 2014-07-08 ENCOUNTER — Other Ambulatory Visit: Payer: Self-pay | Admitting: Family Medicine

## 2014-07-08 NOTE — Telephone Encounter (Signed)
Spoke to pt and informed her f/u is required for additional refills. Pt sched ov for 07/23/14

## 2014-07-23 ENCOUNTER — Ambulatory Visit (INDEPENDENT_AMBULATORY_CARE_PROVIDER_SITE_OTHER): Payer: Medicare Other | Admitting: Family Medicine

## 2014-07-23 VITALS — BP 140/88 | HR 125 | Temp 98.0°F | Ht 58.5 in | Wt 185.8 lb

## 2014-07-23 DIAGNOSIS — Z79899 Other long term (current) drug therapy: Secondary | ICD-10-CM

## 2014-07-23 DIAGNOSIS — D509 Iron deficiency anemia, unspecified: Secondary | ICD-10-CM | POA: Diagnosis not present

## 2014-07-23 DIAGNOSIS — K219 Gastro-esophageal reflux disease without esophagitis: Secondary | ICD-10-CM

## 2014-07-23 DIAGNOSIS — F3289 Other specified depressive episodes: Secondary | ICD-10-CM

## 2014-07-23 DIAGNOSIS — F329 Major depressive disorder, single episode, unspecified: Secondary | ICD-10-CM | POA: Diagnosis not present

## 2014-07-23 LAB — CBC WITH DIFFERENTIAL/PLATELET
Basophils Absolute: 0 10*3/uL (ref 0.0–0.1)
Basophils Relative: 0.5 % (ref 0.0–3.0)
EOS PCT: 0.8 % (ref 0.0–5.0)
Eosinophils Absolute: 0.1 10*3/uL (ref 0.0–0.7)
HEMATOCRIT: 48.8 % — AB (ref 36.0–46.0)
Hemoglobin: 16.2 g/dL — ABNORMAL HIGH (ref 12.0–15.0)
LYMPHS ABS: 1.4 10*3/uL (ref 0.7–4.0)
LYMPHS PCT: 16 % (ref 12.0–46.0)
MCHC: 33.2 g/dL (ref 30.0–36.0)
MCV: 91.4 fl (ref 78.0–100.0)
Monocytes Absolute: 0.7 10*3/uL (ref 0.1–1.0)
Monocytes Relative: 8.4 % (ref 3.0–12.0)
Neutro Abs: 6.4 10*3/uL (ref 1.4–7.7)
Neutrophils Relative %: 74.3 % (ref 43.0–77.0)
PLATELETS: 253 10*3/uL (ref 150.0–400.0)
RBC: 5.34 Mil/uL — ABNORMAL HIGH (ref 3.87–5.11)
RDW: 13.2 % (ref 11.5–15.5)
WBC: 8.6 10*3/uL (ref 4.0–10.5)

## 2014-07-23 LAB — FERRITIN: FERRITIN: 41.7 ng/mL (ref 10.0–291.0)

## 2014-07-23 LAB — VITAMIN B12: VITAMIN B 12: 633 pg/mL (ref 211–911)

## 2014-07-23 NOTE — Patient Instructions (Signed)
Good to see you. We will call you with your lab results and you will hear from [facility] about your hematology referral.

## 2014-07-23 NOTE — Progress Notes (Signed)
Subjective:    Patient ID: Melanie Bird, female    DOB: [birth year], 73 y.o.   MRN: [id number]  HPI  Pt presents to the clinic today for a follow up.   I have not seen her for routine care in years.  She did see Webb Silversmith in 09/2013 for lab follow up.  Dr. Cynda Acres has been managing her anemia-she wants to see another hematologist.  She denies chest pain or shortness of breath. She is taking Ferrex and Tandem Plus daily as prescribed.  She is more fatigued.  She would like to be checked for "low b12."  Lab Results  Component Value Date   WBC 9.6 10/16/2013   HGB 15.0 10/16/2013   HCT 44.2 10/16/2013   MCV 89.7 10/16/2013   PLT 281.0 10/16/2013   Also takes Prilosec 20 mg daily for GERD. Current Outpatient Prescriptions on File Prior to Visit  Medication Sig Dispense Refill  . Calcium Carbonate-Vitamin D (CALCIUM-VITAMIN D) 500-200 MG-UNIT per tablet Take 1 tablet by mouth daily.      Marland Kitchen FeFum-FePo-FA-B Cmp-C-Zn-Mn-Cu (SE-TAN PLUS) 162-115.2-1 MG CAPS TAKE ONE CAPSULE BY MOUTH DAILY  30 capsule  0  . Multiple Vitamin (MULTIVITAMIN) tablet Take 1 tablet by mouth daily.      Marland Kitchen omeprazole (PRILOSEC) 20 MG capsule Take 1 capsule (20 mg total) by mouth daily.  30 capsule  6  . Polysaccharide Iron Complex (FERREX 150 PO) Take by mouth as directed.       No current facility-administered medications on file prior to visit.    No Known Allergies  Past Medical History  Diagnosis Date  . Anemia     No past surgical history on file.  No family history on file.  History   Social History  . Marital Status: Widowed    Spouse Name: N/A    Number of Children: N/A  . Years of Education: N/A   Occupational History  . Not on file.   Social History Main Topics  . Smoking status: Never Smoker   . Smokeless tobacco: Never Used  . Alcohol Use: No  . Drug Use: No  . Sexual Activity: Not Currently   Other Topics Concern  . Not on file   Social History Narrative  . No  narrative on file   The PMH, PSH, Social History, Family History, Medications, and allergies have been reviewed in [facility], and have been updated if relevant.   Review of Systems See HPI No blood in stool No CP or SOB No black stools  Past Medical History  Diagnosis Date  . Anemia     Current Outpatient Prescriptions  Medication Sig Dispense Refill  . Calcium Carbonate-Vitamin D (CALCIUM-VITAMIN D) 500-200 MG-UNIT per tablet Take 1 tablet by mouth daily.      Marland Kitchen FeFum-FePo-FA-B Cmp-C-Zn-Mn-Cu (SE-TAN PLUS) 162-115.2-1 MG CAPS TAKE ONE CAPSULE BY MOUTH DAILY  30 capsule  0  . Multiple Vitamin (MULTIVITAMIN) tablet Take 1 tablet by mouth daily.      Marland Kitchen omeprazole (PRILOSEC) 20 MG capsule Take 1 capsule (20 mg total) by mouth daily.  30 capsule  6  . Polysaccharide Iron Complex (FERREX 150 PO) Take by mouth as directed.       No current facility-administered medications for this visit.    No Known Allergies  No family history on file.  History   Social History  . Marital Status: Widowed    Spouse Name: N/A    Number of Children: N/A  . Years  of Education: N/A   Occupational History  . Not on file.   Social History Main Topics  . Smoking status: Never Smoker   . Smokeless tobacco: Never Used  . Alcohol Use: No  . Drug Use: No  . Sexual Activity: Not Currently   Other Topics Concern  . Not on file   Social History Narrative  . No narrative on file       Objective:   Physical Exam   BP 140/88  Pulse 125  Temp(Src) 98 F (36.7 C) (Oral)  Ht 4' 10.5" (1.486 m)  Wt 185 lb 12 oz (84.256 kg)  BMI 38.16 kg/m2  SpO2 91% Wt Readings from Last 3 Encounters:  07/23/14 185 lb 12 oz (84.256 kg)  10/16/13 190 lb 8 oz (86.41 kg)  06/27/13 197 lb (89.359 kg)    General: Appears her stated age, well developed, well nourished in NAD. Cardiovascular: Normal rate and rhythm. S1,S2 noted.  No murmur, rubs or gallops noted. No JVD or BLE edema. No carotid bruits  noted. Pulmonary/Chest: Normal effort and positive vesicular breath sounds. No respiratory distress. No wheezes, rales or ronchi noted.  Abdomen: Soft and nontender. Normal bowel sounds, no bruits noted. No distention or masses noted.           Assessment & Plan:

## 2014-07-23 NOTE — Assessment & Plan Note (Signed)
Followed by hematology. She has asked to be referred to a female hematologist.  I will discuss this with our referrals coordinator. Check CBC today. She would also like B12 checked today. Orders entered. Orders Placed This Encounter  Procedures  . CBC with Differential  . Ferritin  . Vitamin B12

## 2014-07-23 NOTE — Progress Notes (Signed)
Pre visit review using our clinic review tool, if applicable. No additional management support is needed unless otherwise documented below in the visit note. 

## 2014-07-30 ENCOUNTER — Ambulatory Visit: Payer: Self-pay | Admitting: Internal Medicine

## 2014-07-30 DIAGNOSIS — Z79899 Other long term (current) drug therapy: Secondary | ICD-10-CM | POA: Diagnosis not present

## 2014-07-30 DIAGNOSIS — E860 Dehydration: Secondary | ICD-10-CM | POA: Diagnosis not present

## 2014-07-30 DIAGNOSIS — D45 Polycythemia vera: Secondary | ICD-10-CM | POA: Diagnosis not present

## 2014-07-30 LAB — CBC CANCER CENTER
BASOS PCT: 1.2 %
Basophil #: 0.1 x10 3/mm (ref 0.0–0.1)
EOS ABS: 0.1 x10 3/mm (ref 0.0–0.7)
EOS PCT: 1.7 %
HCT: 47.6 % — ABNORMAL HIGH (ref 35.0–47.0)
HGB: 15.4 g/dL (ref 12.0–16.0)
LYMPHS PCT: 18.5 %
Lymphocyte #: 1.2 x10 3/mm (ref 1.0–3.6)
MCH: 30.1 pg (ref 26.0–34.0)
MCHC: 32.3 g/dL (ref 32.0–36.0)
MCV: 93 fL (ref 80–100)
MONOS PCT: 8.3 %
Monocyte #: 0.6 x10 3/mm (ref 0.2–0.9)
NEUTROS ABS: 4.7 x10 3/mm (ref 1.4–6.5)
NEUTROS PCT: 70.3 %
Platelet: 252 x10 3/mm (ref 150–440)
RBC: 5.12 10*6/uL (ref 3.80–5.20)
RDW: 13.1 % (ref 11.5–14.5)
WBC: 6.7 x10 3/mm (ref 3.6–11.0)

## 2014-07-31 ENCOUNTER — Ambulatory Visit: Payer: Medicare Other | Admitting: Family Medicine

## 2014-07-31 ENCOUNTER — Telehealth: Payer: Self-pay | Admitting: Family Medicine

## 2014-07-31 NOTE — Telephone Encounter (Signed)
I offered the appointment to patient and she said she wanted to see you.

## 2014-07-31 NOTE — Telephone Encounter (Signed)
Dr. Damita Dunnings told me he is seeing her today since I am seeing one of his very complicated patients.

## 2014-07-31 NOTE — Telephone Encounter (Signed)
Patient saw Dr. Inez Pilgrim yesterday and she had no energy.  Dr.Gittin told her to call her PCP and schedule an appointment because she is give out and has no energy and ask for something to give her energy.  I made an appointment for her today, but someone else made an appointment at the same time.  Can I use a same day appointment tomorrow, since there was the mix up today?

## 2014-07-31 NOTE — Telephone Encounter (Signed)
Yes that is fine

## 2014-08-01 ENCOUNTER — Ambulatory Visit (INDEPENDENT_AMBULATORY_CARE_PROVIDER_SITE_OTHER)
Admission: RE | Admit: 2014-08-01 | Discharge: 2014-08-01 | Disposition: A | Discharge status: Home / Self Care | Payer: Medicare Other | Source: Ambulatory Visit | Attending: Family Medicine | Admitting: Family Medicine

## 2014-08-01 ENCOUNTER — Ambulatory Visit (INDEPENDENT_AMBULATORY_CARE_PROVIDER_SITE_OTHER): Payer: Medicare Other | Admitting: Family Medicine

## 2014-08-01 ENCOUNTER — Encounter: Payer: Self-pay | Admitting: Family Medicine

## 2014-08-01 VITALS — BP 138/84 | HR 120 | Temp 98.3°F | Wt 187.8 lb

## 2014-08-01 DIAGNOSIS — R0609 Other forms of dyspnea: Secondary | ICD-10-CM

## 2014-08-01 DIAGNOSIS — E559 Vitamin D deficiency, unspecified: Secondary | ICD-10-CM | POA: Diagnosis not present

## 2014-08-01 DIAGNOSIS — R5381 Other malaise: Secondary | ICD-10-CM

## 2014-08-01 DIAGNOSIS — R5383 Other fatigue: Secondary | ICD-10-CM | POA: Diagnosis not present

## 2014-08-01 DIAGNOSIS — R0989 Other specified symptoms and signs involving the circulatory and respiratory systems: Secondary | ICD-10-CM

## 2014-08-01 LAB — T4, FREE: FREE T4: 0.88 ng/dL (ref 0.60–1.60)

## 2014-08-01 LAB — TSH: TSH: 0.7 u[IU]/mL (ref 0.35–4.50)

## 2014-08-01 LAB — VITAMIN D 25 HYDROXY (VIT D DEFICIENCY, FRACTURES): VITD: 22.79 ng/mL — ABNORMAL LOW (ref 30.00–100.00)

## 2014-08-01 NOTE — Assessment & Plan Note (Signed)
Progressive. Normal B12 and CBC. Will check TSH and Vit D today. CXR, referral for stress test given DOE.

## 2014-08-01 NOTE — Patient Instructions (Signed)
Good to see you. We will call you with your blood work and heart doctor referral.

## 2014-08-01 NOTE — Progress Notes (Signed)
Subjective:   Patient ID: Melanie Bird, female    DOB: 08-08-1941, 73 y.o.   MRN: [id number]  Melanie Bird is a pleasant 73 y.o. year old female who presents to clinic today with Fatigue  on 08/01/2014  HPI:  Persistent fatigue for months. Dr. Cynda Acres has been managing her anemia-per pt, he told her to see me today because her fatigue is not being caused by her anemia.  She denies chest pain or shortness of breath. She is taking Ferrex and Tandem Plus daily as prescribed.   She is very winded- has progressive DOE with short distances. No CP She is a non smoker.  Appetite good. Wt Readings from Last 3 Encounters:  08/01/14 187 lb 12 oz (85.163 kg)  07/23/14 185 lb 12 oz (84.256 kg)  10/16/13 190 lb 8 oz (86.41 kg)   Lab Results  Component Value Date   WBC 8.6 07/23/2014   HGB 16.2* 07/23/2014   HCT 48.8* 07/23/2014   MCV 91.4 07/23/2014   PLT 253.0 07/23/2014   Lab Results  Component Value Date   VITAMINB12 633 07/23/2014    Lab Results  Component Value Date   CHOL 234* 10/13/2010   HDL 67.00 10/13/2010   LDLDIRECT 139.3 10/13/2010   TRIG 201.0* 10/13/2010   CHOLHDL 3 10/13/2010   Current Outpatient Prescriptions on File Prior to Visit  Medication Sig Dispense Refill  . Calcium Carbonate-Vitamin D (CALCIUM-VITAMIN D) 500-200 MG-UNIT per tablet Take 1 tablet by mouth daily.      Marland Kitchen FeFum-FePo-FA-B Cmp-C-Zn-Mn-Cu (SE-TAN PLUS) 162-115.2-1 MG CAPS TAKE ONE CAPSULE BY MOUTH DAILY  30 capsule  0  . Multiple Vitamin (MULTIVITAMIN) tablet Take 1 tablet by mouth daily.      Marland Kitchen omeprazole (PRILOSEC) 20 MG capsule Take 1 capsule (20 mg total) by mouth daily.  30 capsule  6  . Polysaccharide Iron Complex (FERREX 150 PO) Take by mouth as directed.       No current facility-administered medications on file prior to visit.    No Known Allergies  Past Medical History  Diagnosis Date  . Anemia     No past surgical history on file.  No family history on file.  History   Social  History  . Marital Status: Widowed    Spouse Name: N/A    Number of Children: N/A  . Years of Education: N/A   Occupational History  . Not on file.   Social History Main Topics  . Smoking status: Never Smoker   . Smokeless tobacco: Never Used  . Alcohol Use: No  . Drug Use: No  . Sexual Activity: Not Currently   Other Topics Concern  . Not on file   Social History Narrative  . No narrative on file   The PMH, PSH, Social History, Family History, Medications, and allergies have been reviewed in [facility], and have been updated if relevant.    Review of Systems    See HPI No dizziness No diaphoresis No HA DOE is relieved when she rests No CP No LE edema No blurred vision Objective:    BP 138/84  Pulse 120  Temp(Src) 98.3 F (36.8 C) (Oral)  Wt 187 lb 12 oz (85.163 kg)  SpO2 96%   Physical Exam  Nursing note and vitals reviewed. Constitutional: She appears well-developed and well-nourished.  HENT:  Head: Normocephalic and atraumatic.  Eyes: Pupils are equal, round, and reactive to light.  Neck: Normal range of motion. Neck supple.  Cardiovascular: Normal  rate and regular rhythm.   Pulmonary/Chest: Effort normal and breath sounds normal.  Abdominal: Soft. Bowel sounds are normal.  Musculoskeletal: Normal range of motion.  Neurological: She is alert.  Skin: Skin is warm and dry.  Psychiatric: She has a normal mood and affect. Her behavior is normal. Judgment and thought content normal.          Assessment & Plan:   Other malaise and fatigue - Plan: TSH, T4, Free, Vitamin D, 25-hydroxy  DOE (dyspnea on exertion) - Plan: Ambulatory referral to Cardiology, DG Chest 2 View No Follow-up on file.

## 2014-08-01 NOTE — Assessment & Plan Note (Signed)
New- progressive. Lungs clear on exam. Probable deconditioning but I would like her to have a stress test to rule out cardiac etiology since it is progressive. Referral placed.

## 2014-08-01 NOTE — Progress Notes (Signed)
Pre visit review using our clinic review tool, if applicable. No additional management support is needed unless otherwise documented below in the visit note. 

## 2014-08-04 MED ORDER — VITAMIN D (ERGOCALCIFEROL) 1.25 MG (50000 UNIT) PO CAPS: 50000.0000 [IU] | ORAL_CAPSULE | ORAL | Status: DC

## 2014-08-04 NOTE — Addendum Note (Signed)
Addended by: Modena Nunnery on: 08/04/2014 02:23 PM   Modules accepted: Orders

## 2014-08-06 DIAGNOSIS — Z79899 Other long term (current) drug therapy: Secondary | ICD-10-CM | POA: Diagnosis not present

## 2014-08-06 DIAGNOSIS — D45 Polycythemia vera: Secondary | ICD-10-CM | POA: Diagnosis not present

## 2014-08-06 DIAGNOSIS — E860 Dehydration: Secondary | ICD-10-CM | POA: Diagnosis not present

## 2014-08-06 LAB — CBC CANCER CENTER
Basophil #: 0 x10 3/mm (ref 0.0–0.1)
Basophil %: 0.4 %
EOS PCT: 0.6 %
Eosinophil #: 0.1 x10 3/mm (ref 0.0–0.7)
HCT: 49.2 % — ABNORMAL HIGH (ref 35.0–47.0)
HGB: 15.8 g/dL (ref 12.0–16.0)
Lymphocyte #: 1.5 x10 3/mm (ref 1.0–3.6)
Lymphocyte %: 16.8 %
MCH: 29.9 pg (ref 26.0–34.0)
MCHC: 32.2 g/dL (ref 32.0–36.0)
MCV: 93 fL (ref 80–100)
MONOS PCT: 9.3 %
Monocyte #: 0.8 x10 3/mm (ref 0.2–0.9)
NEUTROS PCT: 72.9 %
Neutrophil #: 6.5 x10 3/mm (ref 1.4–6.5)
Platelet: 253 x10 3/mm (ref 150–440)
RBC: 5.31 10*6/uL — AB (ref 3.80–5.20)
RDW: 13 % (ref 11.5–14.5)
WBC: 8.9 x10 3/mm (ref 3.6–11.0)

## 2014-08-06 LAB — IRON AND TIBC
IRON BIND. CAP.(TOTAL): 393 ug/dL (ref 250–450)
Iron Saturation: 39 %
Iron: 152 ug/dL (ref 50–170)
UNBOUND IRON-BIND. CAP.: 241 ug/dL

## 2014-08-13 ENCOUNTER — Ambulatory Visit (INDEPENDENT_AMBULATORY_CARE_PROVIDER_SITE_OTHER): Payer: Medicare Other | Admitting: Cardiovascular Disease

## 2014-08-13 ENCOUNTER — Encounter: Payer: Self-pay | Admitting: Cardiovascular Disease

## 2014-08-13 VITALS — BP 138/110 | HR 114 | Ht 59.0 in | Wt 188.2 lb

## 2014-08-13 DIAGNOSIS — R0602 Shortness of breath: Secondary | ICD-10-CM | POA: Diagnosis not present

## 2014-08-13 DIAGNOSIS — R0989 Other specified symptoms and signs involving the circulatory and respiratory systems: Secondary | ICD-10-CM | POA: Diagnosis not present

## 2014-08-13 DIAGNOSIS — R0609 Other forms of dyspnea: Secondary | ICD-10-CM

## 2014-08-13 MED ORDER — METOPROLOL TARTRATE 25 MG PO TABS: 25.0000 mg | ORAL_TABLET | Freq: Two times a day (BID) | ORAL | Status: DC

## 2014-08-13 NOTE — Assessment & Plan Note (Signed)
Melanie Bird has profound fatigue as well as shortness of breath with exertion. She eats quite a bit of salt in her blood pressure is markedly elevated today. I've advised her to completely eliminate salt from her diet. Of IT an echocardiogram for further evaluation of her left ventricular function and her valvular function. We'll start her on metoprolol 25 mg twice a day to help slow her heart rate down. She had tachycardia at her medical doctor's office as well.  Her TSH has been drawn and was found to be normal.  Her hemoglobin has increased and is now 16.  I'll see her in 3 months for followup evaluation.

## 2014-08-13 NOTE — Progress Notes (Signed)
     Melanie Bird Date of Birth  Apr 27, 1941       [facility]    [employer] [address], [place], [place]  [address]   [place], [place]  [address] [place]   Fax  [phone]     Fax [phone]  Problem List: 1. Hypertension 2. Fatigue 3. Anemia   History of Present Illness:  Melanie Bird is a 73 yo who is referred for dyspnea and generalized fatigue.  She has chronic anemia.  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    She adds salt to almost everything that she eats.    Current Outpatient Prescriptions on File Prior to Visit  Medication Sig Dispense Refill  . Calcium Carbonate-Vitamin D (CALCIUM-VITAMIN D) 500-200 MG-UNIT per tablet Take 1 tablet by mouth daily.      Marland Kitchen FeFum-FePo-FA-B Cmp-C-Zn-Mn-Cu (SE-TAN PLUS) 162-115.2-1 MG CAPS TAKE ONE CAPSULE BY MOUTH DAILY  30 capsule  0  . Multiple Vitamin (MULTIVITAMIN) tablet Take 1 tablet by mouth daily.      Marland Kitchen omeprazole (PRILOSEC) 20 MG capsule Take 1 capsule (20 mg total) by mouth daily.  30 capsule  6  . Polysaccharide Iron Complex (FERREX 150 PO) Take by mouth as directed.      . Vitamin D, Ergocalciferol, (DRISDOL) 50000 UNITS CAPS capsule Take 1 capsule (50,000 Units total) by mouth every 7 (seven) days.  6 capsule  0   No current facility-administered medications on file prior to visit.    No Known Allergies  Past Medical History  Diagnosis Date  . Anemia     History reviewed. No pertinent past surgical history.  History  Smoking status  . Never Smoker   Smokeless tobacco  . Never Used    History  Alcohol Use No    History reviewed. No pertinent family history.  Reviw of Systems:  Reviewed in the HPI.  All other systems are negative.  Physical Exam: Blood pressure 138/110, pulse 114, height 4\' 11"  (1.499 m), weight 188 lb 4 oz (85.39 kg). Wt Readings from Last 3 Encounters:    08/13/14 188 lb 4 oz (85.39 kg)  08/01/14 187 lb 12 oz (85.163 kg)  07/23/14 185 lb 12 oz (84.256 kg)     General: Well developed, well nourished, in no acute distress.  Head: Normocephalic, atraumatic, sclera non-icteric, mucus membranes are moist,   Neck: Supple. Carotids are 2 + without bruits. No JVD   Lungs: Clear   Heart: RR, tachycardic, no significant murmurs  Abdomen: Soft, non-tender, non-distended with normal bowel sounds.  Msk:  Strength and tone are normal   Extremities: No clubbing or cyanosis. No edema.  Distal pedal pulses are 2+ and equal    Neuro: CN II - XII intact.  Alert and oriented X 3.   Psych:  Normal  ECG: August 13, 2014:  Sinus tach at 114.    Assessment / Plan:

## 2014-08-13 NOTE — Patient Instructions (Signed)
Your physician has recommended you make the following change in your medication:  Start Metoprolol 25 mg twice daily     Your physician has requested that you have an echocardiogram. Echocardiography is a painless test that uses sound waves to create images of your heart. It provides your doctor with information about the size and shape of your heart and how well your heart's chambers and valves are working. This procedure takes approximately one hour. There are no restrictions for this procedure.  Please have echo next week if possible    Your physician recommends that you schedule a follow-up appointment in:  3 months with Dr. Acie Fredrickson

## 2014-08-14 DIAGNOSIS — E860 Dehydration: Secondary | ICD-10-CM | POA: Diagnosis not present

## 2014-08-14 DIAGNOSIS — Z79899 Other long term (current) drug therapy: Secondary | ICD-10-CM | POA: Diagnosis not present

## 2014-08-14 DIAGNOSIS — D45 Polycythemia vera: Secondary | ICD-10-CM | POA: Diagnosis not present

## 2014-08-14 LAB — CANCER CENTER HEMATOCRIT: HCT: 48.6 % — AB (ref 35.0–47.0)

## 2014-08-14 LAB — CANCER CENTER HEMOGLOBIN: HGB: 15.7 g/dL (ref 12.0–16.0)

## 2014-08-18 ENCOUNTER — Other Ambulatory Visit: Payer: Self-pay | Admitting: Family Medicine

## 2014-08-19 ENCOUNTER — Ambulatory Visit: Payer: Self-pay | Admitting: Internal Medicine

## 2014-08-19 DIAGNOSIS — Z79899 Other long term (current) drug therapy: Secondary | ICD-10-CM | POA: Diagnosis not present

## 2014-08-19 DIAGNOSIS — D45 Polycythemia vera: Secondary | ICD-10-CM | POA: Diagnosis not present

## 2014-08-19 DIAGNOSIS — E860 Dehydration: Secondary | ICD-10-CM | POA: Diagnosis not present

## 2014-08-21 ENCOUNTER — Telehealth: Payer: Self-pay

## 2014-08-21 NOTE — Telephone Encounter (Signed)
Yes it is every 7 days - 1 tab po weekly x 6 weeks, then continue VitD 1600 IU daily.

## 2014-08-21 NOTE — Telephone Encounter (Signed)
Spoke to pt and informed her of instruction. Pt verbally expressed understanding.

## 2014-08-21 NOTE — Telephone Encounter (Signed)
Pt has question about Vit D 50,000 units; pt cannot remember why she thinks someone told her to take the Vit D 50,000 units every 6 weeks. pts bottle has instructions to take Vit D 50,000 units one cap by mouth every 7 days. Pt took first Vit D on 08/12/14. Advised pt med list has instructions one cap every 7 days but pt wants verification how to take med by Dr Deborra Medina. Pt request cb.

## 2014-08-22 ENCOUNTER — Other Ambulatory Visit (INDEPENDENT_AMBULATORY_CARE_PROVIDER_SITE_OTHER): Payer: Medicare Other

## 2014-08-22 ENCOUNTER — Other Ambulatory Visit: Payer: Self-pay

## 2014-08-22 DIAGNOSIS — R0602 Shortness of breath: Secondary | ICD-10-CM | POA: Diagnosis not present

## 2014-09-01 LAB — CANCER CENTER HEMOGLOBIN: HGB: 16.1 g/dL — ABNORMAL HIGH (ref 12.0–16.0)

## 2014-09-01 LAB — CANCER CENTER HEMATOCRIT: HCT: 48.9 % — ABNORMAL HIGH (ref 35.0–47.0)

## 2014-09-18 ENCOUNTER — Ambulatory Visit: Payer: Self-pay | Admitting: Internal Medicine

## 2014-09-18 DIAGNOSIS — Z79899 Other long term (current) drug therapy: Secondary | ICD-10-CM | POA: Diagnosis not present

## 2014-09-18 DIAGNOSIS — D509 Iron deficiency anemia, unspecified: Secondary | ICD-10-CM | POA: Diagnosis not present

## 2014-09-29 DIAGNOSIS — D509 Iron deficiency anemia, unspecified: Secondary | ICD-10-CM | POA: Diagnosis not present

## 2014-09-29 DIAGNOSIS — Z79899 Other long term (current) drug therapy: Secondary | ICD-10-CM | POA: Diagnosis not present

## 2014-09-29 LAB — CBC CANCER CENTER
Basophil #: 0.2 x10 3/mm — ABNORMAL HIGH (ref 0.0–0.1)
Basophil %: 2 %
EOS PCT: 2.1 %
Eosinophil #: 0.2 x10 3/mm (ref 0.0–0.7)
HCT: 45.7 % (ref 35.0–47.0)
HGB: 14.7 g/dL (ref 12.0–16.0)
LYMPHS PCT: 20.4 %
Lymphocyte #: 1.8 x10 3/mm (ref 1.0–3.6)
MCH: 29.9 pg (ref 26.0–34.0)
MCHC: 32.2 g/dL (ref 32.0–36.0)
MCV: 93 fL (ref 80–100)
MONO ABS: 0.7 x10 3/mm (ref 0.2–0.9)
MONOS PCT: 8.2 %
Neutrophil #: 5.9 x10 3/mm (ref 1.4–6.5)
Neutrophil %: 67.3 %
Platelet: 250 x10 3/mm (ref 150–440)
RBC: 4.92 10*6/uL (ref 3.80–5.20)
RDW: 14.3 % (ref 11.5–14.5)
WBC: 8.8 x10 3/mm (ref 3.6–11.0)

## 2014-10-03 ENCOUNTER — Other Ambulatory Visit: Payer: Self-pay

## 2014-10-03 ENCOUNTER — Other Ambulatory Visit: Payer: Self-pay | Admitting: Family Medicine

## 2014-10-03 DIAGNOSIS — E559 Vitamin D deficiency, unspecified: Secondary | ICD-10-CM

## 2014-10-03 NOTE — Telephone Encounter (Signed)
Pt called for refill on Ctan; pt checked with pharmacy and refills available; nothing further needed.

## 2014-10-06 DIAGNOSIS — Z23 Encounter for immunization: Secondary | ICD-10-CM | POA: Diagnosis not present

## 2014-10-08 ENCOUNTER — Other Ambulatory Visit (INDEPENDENT_AMBULATORY_CARE_PROVIDER_SITE_OTHER): Payer: Medicare Other

## 2014-10-08 DIAGNOSIS — E559 Vitamin D deficiency, unspecified: Secondary | ICD-10-CM

## 2014-10-08 LAB — VITAMIN D 25 HYDROXY (VIT D DEFICIENCY, FRACTURES): VITD: 22.72 ng/mL — AB (ref 30.00–100.00)

## 2014-10-19 ENCOUNTER — Ambulatory Visit: Payer: Self-pay | Admitting: Internal Medicine

## 2014-11-06 ENCOUNTER — Other Ambulatory Visit: Payer: Self-pay

## 2014-11-06 MED ORDER — SE-TAN PLUS 162-115.2-1 MG PO CAPS: 1.0000 | ORAL_CAPSULE | Freq: Every day | ORAL | Status: DC

## 2014-11-06 NOTE — Telephone Encounter (Signed)
Pt left v/m; pt thinks she needs to continue taking SE-Tan plus and if Dr Deborra Medina wants pt to continue med pt request year refill ot [place]. Pt request cb.pt last seen 08/01/14.

## 2014-11-21 ENCOUNTER — Ambulatory Visit: Payer: Medicare Other | Admitting: Cardiovascular Disease

## 2014-12-01 ENCOUNTER — Encounter: Payer: Self-pay | Admitting: Cardiovascular Disease

## 2014-12-01 ENCOUNTER — Ambulatory Visit (INDEPENDENT_AMBULATORY_CARE_PROVIDER_SITE_OTHER): Payer: Medicare Other | Admitting: Cardiovascular Disease

## 2014-12-01 VITALS — BP 140/90 | HR 91 | Ht 59.0 in | Wt 200.2 lb

## 2014-12-01 DIAGNOSIS — R0609 Other forms of dyspnea: Secondary | ICD-10-CM | POA: Diagnosis not present

## 2014-12-01 NOTE — Assessment & Plan Note (Addendum)
She has normal LV systolic function by echo.  Has some diastolic dysfunction which is due to her obesity and HTN. I'[ve stressed the importance of weight loss and keeping BP control.  She does have DOE which will improve with weight loss and a regular exercise program.

## 2014-12-01 NOTE — Progress Notes (Signed)
     Marjory Lies Date of Birth  08/24/1941       [facility]    [employer] [address], [address], [place], [place]  [address]   [place], [place]  [address] [address]   Fax  [phone]     Fax [phone]  Problem List: 1. Hypertension 2. Fatigue 3. Anemia   History of Present Illness:  Darian is a 73 yo who is referred for dyspnea and generalized fatigue.  She has chronic anemia.  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    She adds salt to almost everything that she eats.    Dec. 14, 2015:  Marrisa is a 73 yo who I follow for  hx of HTN and fatigue.  She also has anemia. She still has DOE. Stays busy - raking leaves.    She has gained 12 lbs since her last visit.    Current Outpatient Prescriptions on File Prior to Visit  Medication Sig Dispense Refill  . Calcium Carbonate-Vitamin D (CALCIUM-VITAMIN D) 500-200 MG-UNIT per tablet Take 1 tablet by mouth daily.    Marland Kitchen FeFum-FePo-FA-B Cmp-C-Zn-Mn-Cu (SE-TAN PLUS) 162-115.2-1 MG CAPS Take 1 capsule by mouth daily. 30 capsule 11  . metoprolol tartrate (LOPRESSOR) 25 MG tablet Take 1 tablet (25 mg total) by mouth 2 (two) times daily. 60 tablet 6  . Multiple Vitamin (MULTIVITAMIN) tablet Take 1 tablet by mouth daily.    Marland Kitchen omeprazole (PRILOSEC) 20 MG capsule Take 1 capsule (20 mg total) by mouth daily. 30 capsule 6  . Polysaccharide Iron Complex (FERREX 150 PO) Take by mouth as directed.    . Vitamin D, Ergocalciferol, (DRISDOL) 50000 UNITS CAPS capsule Take 1 capsule (50,000 Units total) by mouth every 7 (seven) days. 6 capsule 0   No current facility-administered medications on file prior to visit.    No Known Allergies  Past Medical History  Diagnosis Date  . Anemia     History reviewed. No pertinent past surgical history.  History  Smoking status  . Never Smoker   Smokeless tobacco  . Never Used     History  Alcohol Use No    History reviewed. No pertinent family history.  Reviw of Systems:  Reviewed in the HPI.  All other systems are negative.  Physical Exam: Blood pressure 140/90, pulse 91, height 4\' 11"  (1.499 m), weight 200 lb 4 oz (90.833 kg), SpO2 97 %. Wt Readings from Last 3 Encounters:  12/01/14 200 lb 4 oz (90.833 kg)  08/13/14 188 lb 4 oz (85.39 kg)  08/01/14 187 lb 12 oz (85.163 kg)     General: Well developed, well nourished, in no acute distress.  Head: Normocephalic, atraumatic, sclera non-icteric, mucus membranes are moist,   Neck: Supple. Carotids are 2 + without bruits. No JVD   Lungs: Clear   Heart: RR, tachycardic, no significant murmurs  Abdomen: Soft, non-tender, non-distended with normal bowel sounds.  Msk:  Strength and tone are normal   Extremities: No clubbing or cyanosis. No edema.  Distal pedal pulses are 2+ and equal    Neuro: CN II - XII intact.  Alert and oriented X 3.   Psych:  Normal  ECG: August 13, 2014:  Sinus tach at 114.    Assessment / Plan:

## 2014-12-01 NOTE — Patient Instructions (Signed)
Your physician recommends that you schedule a follow-up appointment in:  As needed with Dr. Acie Fredrickson

## 2014-12-02 ENCOUNTER — Telehealth: Payer: Self-pay | Admitting: Family Medicine

## 2014-12-02 ENCOUNTER — Telehealth: Payer: Self-pay

## 2014-12-02 NOTE — Telephone Encounter (Signed)
Pt will be changing ins co and pharmacies 12/19/2014;pt wants refills on meds thru end of year; pt will ck with pharmacy and if needs refills pharmacy will contact our office and if time for med to be refilled if there are refills available pharmacy will get med refilled for pt.pt voiced understanding.

## 2014-12-16 NOTE — Telephone Encounter (Signed)
ERROR

## 2014-12-17 ENCOUNTER — Other Ambulatory Visit: Payer: Self-pay | Admitting: Family Medicine

## 2014-12-17 MED ORDER — METOPROLOL TARTRATE 25 MG PO TABS: 25.0000 mg | ORAL_TABLET | Freq: Two times a day (BID) | ORAL | Status: DC

## 2014-12-17 NOTE — Telephone Encounter (Signed)
Pt left vm on triage phone stating that she lost the bottle of Metoprolol that she picked up yesterday and needs another to be called in.  She is aware that insurance will not pay for another bottle until next month.  RX sent.

## 2015-01-19 ENCOUNTER — Other Ambulatory Visit: Payer: Self-pay | Admitting: Family Medicine

## 2015-01-30 ENCOUNTER — Telehealth: Payer: Self-pay

## 2015-01-30 NOTE — Telephone Encounter (Signed)
Dr. Deborra Medina note from 07/2014 said she was being followed by hematology. She should contact them to change her iron supplement. Let me know if she no longer sees hematology and I will prescribe alternative.

## 2015-01-30 NOTE — Telephone Encounter (Signed)
Pt left v/m; [facility] ins does not cover Se Tan plus. Pt request substitute med to [facility]. Ins co told pt does not have suggestion for a comparable med to substitute and advised pt to contact PCP for med mgt.Please advise.

## 2015-01-30 NOTE — Telephone Encounter (Signed)
Spoke to pt who states that she does see hematology;advised per [facility] and pt verbally expressed understanding.

## 2015-02-12 NOTE — Telephone Encounter (Signed)
Pt called and upset due to not hearing back about Se Tan; I asked pt if she remembered speaking with Dr Hulen Shouts asst on 01/30/15 and she said yes and pt had called Dr Maree Krabbe office but has not gotten call back. Advised pt to call Dr Maree Krabbe office for update. Pt voiced understanding.

## 2015-02-12 NOTE — Telephone Encounter (Signed)
[facility] request substitute for Se Tan; advised per phone note and pharmacist will contact hematologist.

## 2015-03-09 ENCOUNTER — Telehealth: Payer: Self-pay

## 2015-03-09 NOTE — Telephone Encounter (Signed)
Left message for pt to call back if she still wants flu vaccine 

## 2015-03-16 ENCOUNTER — Telehealth: Payer: Self-pay

## 2015-03-16 NOTE — Telephone Encounter (Signed)
Phone number disconnected, no way to leave message about flu vaccine

## 2015-04-30 ENCOUNTER — Telehealth: Payer: Self-pay | Admitting: *Deleted

## 2015-04-30 MED ORDER — SENNOSIDES-DOCUSATE SODIUM 8.6-50 MG PO TABS: 1.0000 | ORAL_TABLET | Freq: Two times a day (BID) | ORAL | Status: DC

## 2015-04-30 NOTE — Telephone Encounter (Signed)
Refill sent to [place] for [employer]

## 2015-07-29 ENCOUNTER — Other Ambulatory Visit: Payer: Self-pay | Admitting: Family Medicine

## 2015-08-10 ENCOUNTER — Other Ambulatory Visit: Payer: Self-pay | Admitting: Family Medicine

## 2015-08-14 ENCOUNTER — Telehealth: Payer: Self-pay | Admitting: *Deleted

## 2015-08-14 ENCOUNTER — Encounter: Payer: Self-pay | Admitting: *Deleted

## 2015-08-14 NOTE — Telephone Encounter (Signed)
Pt's last office note and release from practice letter faxed to Dr. Arnette Norris with [place] at [facility] per Dr. Inez Pilgrim.Marland KitchenMarland Kitchen

## 2015-10-10 ENCOUNTER — Other Ambulatory Visit: Payer: Self-pay | Admitting: Family Medicine

## 2015-10-12 NOTE — Telephone Encounter (Signed)
Spoke to pt and advised last seen 07/2014. F/u appt scheduled and med filled for #60

## 2015-10-14 ENCOUNTER — Ambulatory Visit: Payer: Private Health Insurance - Indemnity | Admitting: Family Medicine

## 2015-10-26 ENCOUNTER — Ambulatory Visit (INDEPENDENT_AMBULATORY_CARE_PROVIDER_SITE_OTHER): Payer: Medicare Other | Admitting: Family Medicine

## 2015-10-26 VITALS — BP 132/70 | HR 96 | Temp 98.0°F | Wt 188.5 lb

## 2015-10-26 DIAGNOSIS — D509 Iron deficiency anemia, unspecified: Secondary | ICD-10-CM | POA: Diagnosis not present

## 2015-10-26 DIAGNOSIS — K219 Gastro-esophageal reflux disease without esophagitis: Secondary | ICD-10-CM | POA: Diagnosis not present

## 2015-10-26 DIAGNOSIS — R5382 Chronic fatigue, unspecified: Secondary | ICD-10-CM

## 2015-10-26 DIAGNOSIS — R5383 Other fatigue: Secondary | ICD-10-CM | POA: Insufficient documentation

## 2015-10-26 DIAGNOSIS — I10 Essential (primary) hypertension: Secondary | ICD-10-CM | POA: Diagnosis not present

## 2015-10-26 DIAGNOSIS — E559 Vitamin D deficiency, unspecified: Secondary | ICD-10-CM

## 2015-10-26 DIAGNOSIS — Z23 Encounter for immunization: Secondary | ICD-10-CM

## 2015-10-26 LAB — CBC WITH DIFFERENTIAL/PLATELET
Basophils Absolute: 0.1 10*3/uL (ref 0.0–0.1)
Basophils Relative: 0.6 % (ref 0.0–3.0)
EOS PCT: 0 % (ref 0.0–5.0)
Eosinophils Absolute: 0 10*3/uL (ref 0.0–0.7)
HCT: 27.6 % — ABNORMAL LOW (ref 36.0–46.0)
HEMOGLOBIN: 8.4 g/dL — AB (ref 12.0–15.0)
LYMPHS PCT: 8.6 % — AB (ref 12.0–46.0)
Lymphs Abs: 1.2 10*3/uL (ref 0.7–4.0)
MCHC: 30.5 g/dL (ref 30.0–36.0)
MCV: 94.4 fl (ref 78.0–100.0)
MONO ABS: 0.6 10*3/uL (ref 0.1–1.0)
Monocytes Relative: 4.1 % (ref 3.0–12.0)
Neutro Abs: 11.8 10*3/uL — ABNORMAL HIGH (ref 1.4–7.7)
Neutrophils Relative %: 86.7 % — ABNORMAL HIGH (ref 43.0–77.0)
Platelets: 548 10*3/uL — ABNORMAL HIGH (ref 150.0–400.0)
RBC: 2.93 Mil/uL — AB (ref 3.87–5.11)
RDW: 17.1 % — ABNORMAL HIGH (ref 11.5–15.5)

## 2015-10-26 LAB — FERRITIN: FERRITIN: 17.8 ng/mL (ref 10.0–291.0)

## 2015-10-26 LAB — H. PYLORI ANTIBODY, IGG: H PYLORI IGG: NEGATIVE

## 2015-10-26 MED ORDER — VITAMIN D (ERGOCALCIFEROL) 1.25 MG (50000 UNIT) PO CAPS: 50000.0000 [IU] | ORAL_CAPSULE | ORAL | Status: DC

## 2015-10-26 MED ORDER — SE-TAN PLUS 162-115.2-1 MG PO CAPS: 1.0000 | ORAL_CAPSULE | Freq: Every day | ORAL | Status: DC

## 2015-10-26 MED ORDER — METOPROLOL TARTRATE 25 MG PO TABS: ORAL_TABLET | ORAL | Status: DC

## 2015-10-26 NOTE — Assessment & Plan Note (Signed)
>  25 minutes spent in face to face time with patient, >50% spent in counselling or coordination of care discussing anemia, fatigue, Vit D deficiency and HTN. Restart iron- eRx sent, she is very likely anemic.  Check CBC and ferritin today, refer back to hematology (referral placed). Will also check H Pylori. Restart high dose Vit D- eRx sent.

## 2015-10-26 NOTE — Patient Instructions (Signed)
Good to see you. Please restart taking your iron and high dose Vitamin D.  Please stop by to see Rosaria Ferries on your way out.  We will call you with your lab results.

## 2015-10-26 NOTE — Progress Notes (Signed)
Subjective:   Patient ID: Melanie Bird, female    DOB: 1941-08-09, 74 y.o.   MRN: [id number]  Melanie Bird is a pleasant 74 y.o. year old female who presents to clinic today with Follow-up and Fatigue  on 10/26/2015  HPI:  Persistent fatigue for months. Dr. Cynda Acres had been managing her anemia but she has not seen him in months..  She denies chest pain or shortness of breath. She was taking Ferrex and Tandem Plus daily as prescribed but ran out- "I didn't know I was supposed to be taking them."   She is very tired.  Overdue for labs.  Does have a history of Vit D deficiency.  Vit D remains low this month.  HTN- she is taking Metoprolol 25 mg twice daily. Denies HA, blurred vision, CP or SOB. Wt Readings from Last 3 Encounters:  10/26/15 188 lb 8 oz (85.503 kg)  12/01/14 200 lb 4 oz (90.833 kg)  08/13/14 188 lb 4 oz (85.39 kg)   Lab Results  Component Value Date   WBC 8.8 09/29/2014   HGB 14.7 09/29/2014   HCT 45.7 09/29/2014   MCV 93 09/29/2014   PLT 250 09/29/2014   Lab Results  Component Value Date   [id number] 952 07/23/2014    Lab Results  Component Value Date   CHOL 234* 10/13/2010   HDL 67.00 10/13/2010   LDLDIRECT 139.3 10/13/2010   TRIG 201.0* 10/13/2010   CHOLHDL 3 10/13/2010   Current Outpatient Prescriptions on File Prior to Visit  Medication Sig Dispense Refill  . Multiple Vitamin (MULTIVITAMIN) tablet Take 1 tablet by mouth daily.    Marland Kitchen omeprazole (PRILOSEC) 20 MG capsule Take 1 capsule (20 mg total) by mouth daily. 30 capsule 6  . senna-docusate (SENNA S) 8.6-50 MG per tablet Take 1 tablet by mouth 2 (two) times daily. 60 tablet 2   No current facility-administered medications on file prior to visit.    No Known Allergies  Past Medical History  Diagnosis Date  . Anemia     No past surgical history on file.  No family history on file.  Social History   Social History  . Marital Status: Married    Spouse Name: N/A  . Number of  Children: N/A  . Years of Education: N/A   Occupational History  . Not on file.   Social History Main Topics  . Smoking status: Never Smoker   . Smokeless tobacco: Never Used  . Alcohol Use: No  . Drug Use: No  . Sexual Activity: Not Currently   Other Topics Concern  . Not on file   Social History Narrative   The PMH, PSH, Social History, Family History, Medications, and allergies have been reviewed in [facility], and have been updated if relevant.    Review of Systems  Constitutional: Positive for fatigue. Negative for fever, chills, diaphoresis and unexpected weight change.  HENT: Negative.   Eyes: Negative.   Respiratory: Positive for shortness of breath. Negative for wheezing.   Cardiovascular: Negative.   Gastrointestinal: Negative.   Endocrine: Negative.   Genitourinary: Negative.   Musculoskeletal: Negative.   Skin: Negative.   Allergic/Immunologic: Negative.   Neurological: Positive for dizziness. Negative for tremors, seizures, syncope, facial asymmetry, speech difficulty, weakness, light-headedness, numbness and headaches.  Hematological: Negative.   Psychiatric/Behavioral: Negative.        Objective:    BP 132/70 mmHg  Pulse 96  Temp(Src) 98 F (36.7 C) (Oral)  Wt 188 lb 8 oz (  85.503 kg)  SpO2 97%   Physical Exam  Constitutional: She appears well-developed and well-nourished.  HENT:  Head: Normocephalic and atraumatic.  Eyes: Pupils are equal, round, and reactive to light.  Neck: Normal range of motion. Neck supple.  Cardiovascular: Normal rate and regular rhythm.   Pulmonary/Chest: Effort normal and breath sounds normal.  Abdominal: Soft. Bowel sounds are normal.  Musculoskeletal: Normal range of motion.  Neurological: She is alert.  Skin: Skin is warm and dry.  Psychiatric: She has a normal mood and affect. Her behavior is normal. Judgment and thought content normal.  Nursing note and vitals reviewed.         Assessment & Plan:    Gastroesophageal reflux disease, esophagitis presence not specified  Need for influenza vaccination - Plan: Flu Vaccine QUAD 36+ mos PF IM (Fluarix & Fluzone Quad PF)  Essential hypertension  Chronic fatigue  ANEMIA-IRON DEFICIENCY No Follow-up on file.

## 2015-11-10 ENCOUNTER — Inpatient Hospital Stay: Payer: Medicare Other

## 2015-11-10 ENCOUNTER — Inpatient Hospital Stay: Payer: Medicare Other | Attending: Internal Medicine | Admitting: Internal Medicine

## 2015-11-10 ENCOUNTER — Encounter: Payer: Self-pay | Admitting: Internal Medicine

## 2015-11-10 VITALS — BP 141/79 | HR 76 | Temp 97.4°F | Resp 22 | Ht 59.0 in | Wt 194.9 lb

## 2015-11-10 DIAGNOSIS — R0602 Shortness of breath: Secondary | ICD-10-CM | POA: Diagnosis not present

## 2015-11-10 DIAGNOSIS — D649 Anemia, unspecified: Secondary | ICD-10-CM

## 2015-11-10 DIAGNOSIS — Z79899 Other long term (current) drug therapy: Secondary | ICD-10-CM | POA: Diagnosis not present

## 2015-11-10 DIAGNOSIS — D509 Iron deficiency anemia, unspecified: Secondary | ICD-10-CM

## 2015-11-10 DIAGNOSIS — I10 Essential (primary) hypertension: Secondary | ICD-10-CM | POA: Diagnosis not present

## 2015-11-10 LAB — SAVE SMEAR

## 2015-11-10 LAB — CBC WITH DIFFERENTIAL/PLATELET
BASOS ABS: 0.1 10*3/uL (ref 0–0.1)
Basophils Relative: 1 %
Eosinophils Absolute: 0.1 10*3/uL (ref 0–0.7)
Eosinophils Relative: 1 %
HEMATOCRIT: 30.1 % — AB (ref 35.0–47.0)
Hemoglobin: 8.9 g/dL — ABNORMAL LOW (ref 12.0–16.0)
LYMPHS ABS: 1.3 10*3/uL (ref 1.0–3.6)
Lymphocytes Relative: 15 %
MCH: 28.9 pg (ref 26.0–34.0)
MCHC: 29.6 g/dL — ABNORMAL LOW (ref 32.0–36.0)
MCV: 97.7 fL (ref 80.0–100.0)
MONO ABS: 0.5 10*3/uL (ref 0.2–0.9)
Monocytes Relative: 6 %
NEUTROS ABS: 6.9 10*3/uL — AB (ref 1.4–6.5)
Platelets: 495 10*3/uL — ABNORMAL HIGH (ref 150–440)
RBC: 3.09 MIL/uL — AB (ref 3.80–5.20)
RDW: 16.5 % — ABNORMAL HIGH (ref 11.5–14.5)
WBC: 8.8 10*3/uL (ref 3.6–11.0)

## 2015-11-10 LAB — COMPREHENSIVE METABOLIC PANEL
ALT: 12 U/L — AB (ref 14–54)
AST: 18 U/L (ref 15–41)
Albumin: 3.2 g/dL — ABNORMAL LOW (ref 3.5–5.0)
Alkaline Phosphatase: 71 U/L (ref 38–126)
Anion gap: 6 (ref 5–15)
BUN: 11 mg/dL (ref 6–20)
CHLORIDE: 113 mmol/L — AB (ref 101–111)
CO2: 24 mmol/L (ref 22–32)
CREATININE: 0.74 mg/dL (ref 0.44–1.00)
Calcium: 9.5 mg/dL (ref 8.9–10.3)
GFR calc Af Amer: >60 mL/min (ref >60)
GFR calc non Af Amer: >60 mL/min (ref >60)
Glucose, Bld: 158 mg/dL — ABNORMAL HIGH (ref 65–99)
POTASSIUM: 4.3 mmol/L (ref 3.5–5.1)
SODIUM: 143 mmol/L (ref 135–145)
Total Bilirubin: 0.2 mg/dL — ABNORMAL LOW (ref 0.3–1.2)
Total Protein: 5.9 g/dL — ABNORMAL LOW (ref 6.5–8.1)

## 2015-11-10 LAB — RETICULOCYTES
RBC.: 3.14 MIL/uL — AB (ref 3.80–5.20)
RETIC COUNT ABSOLUTE: 216.7 10*3/uL — AB (ref 19.0–183.0)
RETIC CT PCT: 6.9 % — AB (ref 0.4–3.1)

## 2015-11-10 LAB — LACTATE DEHYDROGENASE: LDH: 130 U/L (ref 98–192)

## 2015-11-10 LAB — VITAMIN B12: Vitamin B-12: 881 pg/mL (ref 180–914)

## 2015-11-10 LAB — DAT, POLYSPECIFIC AHG (ARMC ONLY): POLYSPECIFIC AHG TEST: NEGATIVE

## 2015-11-10 NOTE — Progress Notes (Signed)
   11/10/15 1100  Clinical Encounter Type  Visited With Patient  Visit Type Initial  Provided pastoral presence and support to patient in the cancer center.  [place] [phone]

## 2015-11-10 NOTE — Progress Notes (Signed)
Pt was on iron pills and got taken off. She was put on otc iron pills and it gave her constipation so she took one  A day then off all together. This summer is where she started feeling weak, tired. When she would get weak she would drink or eat something and feel better.  She gets short of breath with  Minimal exertion.  No blood in stool or urine.

## 2015-11-10 NOTE — Progress Notes (Signed)
[place] [facility] [phone]  Fax:([phone]) [place]: [birth year]  MR#: [id number]  [id number]  Patient Care Team: Lucille Passy, MD as PCP - General Dallas Schimke, MD as Consulting Physician (Internal Medicine)  CHIEF COMPLAINT: No chief complaint on file.    No history exists.   Mrs. Farnell has been followed by Dr. Inez Pilgrim in the past for alternating anemia and erythrocytosis. Patient was on iron supplementation for an extended period of time, but couldn't tolerate it, so was tapered off. Unfortunately, patient is a poor historian and obtaining exact timeline events is very difficult. Previous workup showed no evidence of primary hematologic malignancy or other medical conditions which would explain minimally elevated hemoglobin. No flowsheet data found.  INTERVAL HISTORY:  Since last appointment with Dr. Inez Pilgrim patient has been complaining of slowly progressing shortness of breath, weakness. She believes that the anemia has been primary reason for her complaints. She is stopped taking iron supplementation about a year ago. She does not endorse any obvious bleeding, jaundice, surgical operation or injury. She feels short of breath while performing regular activities of daily living. She denies chest pain, dizziness, weakness in arms or legs, diarrhea, easy bruising.   REVIEW OF SYSTEMS:   As per HPI. Otherwise, a complete review of systems is negatve.  PAST MEDICAL HISTORY: Past Medical History  Diagnosis Date  . Anemia     PAST SURGICAL HISTORY: No past surgical history on file.  FAMILY HISTORY No family history on file.  ADVANCED DIRECTIVES:  No flowsheet data found.  HEALTH MAINTENANCE: Social History  Substance Use Topics  . Smoking status: Never Smoker   . Smokeless tobacco: Never Used  . Alcohol Use: No      No Known Allergies  Current Outpatient Prescriptions  Medication Sig Dispense Refill  . FeFum-FePo-FA-B  Cmp-C-Zn-Mn-Cu (SE-TAN PLUS) 162-115.2-1 MG CAPS Take 1 capsule by mouth daily. 30 capsule 11  . metoprolol tartrate (LOPRESSOR) 25 MG tablet TAKE 1 TABLET (25 MG TOTAL) BY MOUTH 2 (TWO) TIMES DAILY. 60 tablet 11  . Multiple Vitamin (MULTIVITAMIN) tablet Take 1 tablet by mouth daily.    Marland Kitchen omeprazole (PRILOSEC) 20 MG capsule Take 1 capsule (20 mg total) by mouth daily. 30 capsule 6  . senna-docusate (SENNA S) 8.6-50 MG per tablet Take 1 tablet by mouth 2 (two) times daily. 60 tablet 2  . Vitamin D, Ergocalciferol, (DRISDOL) 50000 UNITS CAPS capsule Take 1 capsule (50,000 Units total) by mouth every 7 (seven) days. 6 capsule 0   No current facility-administered medications for this visit.    OBJECTIVE:  Filed Vitals:   11/10/15 1153  BP: 141/79  Pulse: 76  Temp: 97.4 F (36.3 C)  Resp: 22     Body mass index is 39.34 kg/(m^2).    ECOG FS:1 - Symptomatic but completely ambulatory Filed Weights   11/10/15 1153  Weight: 194 lb 14.2 oz (88.4 kg)    PHYSICAL EXAM: BP 141/79 mmHg  Pulse 76  Temp(Src) 97.4 F (36.3 C) (Tympanic)  Resp 22  Ht 4\' 11"  (1.499 m)  Wt 194 lb 14.2 oz (88.4 kg)  BMI 39.34 kg/m2  SpO2 100%  General Appearance:    Alert, cooperative, no distress, appears stated age morbidly obese Caucasian female   Head:    Normocephalic, without obvious abnormality, atraumatic  Eyes:    PERRL, conjunctiva/corneas clear, EOM's intact, fundi    benign, both eyes  Ears:    Normal TM's  and external ear canals, both ears  Nose:   Nares normal, septum midline, mucosa normal, no drainage    or sinus tenderness  Throat:   Lips, mucosa, and tongue normal; teeth and gums normal  Neck:   Supple, symmetrical, trachea midline, no adenopathy;    thyroid:  no enlargement/tenderness/nodules; no carotid   bruit or JVD  Back:     Symmetric, no curvature, ROM normal, no CVA tenderness  Lungs:     Clear to auscultation bilaterally, respirations unlabored  Chest Wall:    No tenderness or  deformity   Heart:    Regular rate and rhythm, S1 and S2 normal, no murmur, rub   or gallop  Breast Exam:    deferred by the patient   Abdomen:     Soft, non-tender, bowel sounds active all four quadrants,    no masses, no organomegaly. Physical exam is difficult due to patient's body habitus   Genitalia:   deferred by patient   Rectal:   deferred by patient   Extremities:   Extremities normal, atraumatic, no cyanosis or edema  Pulses:   2+ and symmetric all extremities  Skin:   Skin color, texture, turgor normal, no rashes or lesions  Lymph nodes:   Cervical, supraclavicular, and axillary nodes normal  Neurologic:   CNII-XII intact, normal strength, sensation and reflexes    throughout      LAB RESULTS:  Recent Results (from the past 2160 hour(s))  CBC with Differential/Platelet     Status: Abnormal   Collection Time: 10/26/15  2:55 PM  Result Value Ref Range   WBC 13.6 Repeated and verified X2. (H) 4.0 - 10.5 K/uL   RBC 2.93 (L) 3.87 - 5.11 Mil/uL   Hemoglobin 8.4 (L) 12.0 - 15.0 g/dL   HCT 27.6 (L) 36.0 - 46.0 %   MCV 94.4 78.0 - 100.0 fl   MCHC 30.5 30.0 - 36.0 g/dL   RDW 17.1 (H) 11.5 - 15.5 %   Platelets 548.0 (H) 150.0 - 400.0 K/uL   Neutrophils Relative % 86.7 Repeated and verified X2. (H) 43.0 - 77.0 %   Lymphocytes Relative 8.6 (L) 12.0 - 46.0 %   Monocytes Relative 4.1 3.0 - 12.0 %   Eosinophils Relative 0.0 0.0 - 5.0 %   Basophils Relative 0.6 0.0 - 3.0 %   Neutro Abs 11.8 (H) 1.4 - 7.7 K/uL   Lymphs Abs 1.2 0.7 - 4.0 K/uL   Monocytes Absolute 0.6 0.1 - 1.0 K/uL   Eosinophils Absolute 0.0 0.0 - 0.7 K/uL   Basophils Absolute 0.1 0.0 - 0.1 K/uL  Ferritin     Status: None   Collection Time: 10/26/15  2:55 PM  Result Value Ref Range   Ferritin 17.8 10.0 - 291.0 ng/mL  H. pylori antibody, IgG     Status: None   Collection Time: 10/26/15  2:55 PM  Result Value Ref Range   H Pylori IgG Negative Negative  CBC with Differential     Status: Abnormal   Collection  Time: 11/10/15 11:39 AM  Result Value Ref Range   WBC 8.8 3.6 - 11.0 K/uL   RBC 3.09 (L) 3.80 - 5.20 MIL/uL   Hemoglobin 8.9 (L) 12.0 - 16.0 g/dL    Comment: RESULT REPEATED AND VERIFIED   HCT 30.1 (L) 35.0 - 47.0 %    Comment: RESULT REPEATED AND VERIFIED   MCV 97.7 80.0 - 100.0 fL   MCH 28.9 26.0 - 34.0 pg   MCHC 29.6 (  L) 32.0 - 36.0 g/dL   RDW 16.5 (H) 11.5 - 14.5 %   Platelets 495 (H) 150 - 440 K/uL   Neutrophils Relative % 77% %   Neutro Abs 6.9 (H) 1.4 - 6.5 K/uL   Lymphocytes Relative 15% %   Lymphs Abs 1.3 1.0 - 3.6 K/uL   Monocytes Relative 6% %   Monocytes Absolute 0.5 0.2 - 0.9 K/uL   Eosinophils Relative 1% %   Eosinophils Absolute 0.1 0 - 0.7 K/uL   Basophils Relative 1% %   Basophils Absolute 0.1 0 - 0.1 K/uL  Save smear     Status: None   Collection Time: 11/10/15 11:39 AM  Result Value Ref Range   Smear Review SMEAR STAINED AND AVAILABLE FOR REVIEW       STUDIES: No results found.  ASSESSMENT: Anemia-likely multifactorial. Elevated MCV is inconsistent with iron deficiency anemia, but recent ferritin is fairly low, although still within normal range. The patient denies any history of bleeding, both recent and remote. She follows a regular diet, not a vegetarian, denies diarrhea which would lead to malabsorption. She also denied jaundice. It is somewhat difficult to clearly pinpoint the reason for new anemia. We will check vitamin B12 and folate levels, LDH and Coombs test along with haptoglobin to rule out hemolytic anemia, and will check reticulocyte count and review peripheral smear and renal function. I suspect that Ms. Lougheed indeed had a blood loss/destruction, which could be supported by the fact that her platelet count was elevated (it is lower today). She is fairly stable clinically, so she does not require any blood transfusions at this point.  She'll return to our clinic in 1 week to discuss the results of the workup.   Patient expressed understanding  and was in agreement with this plan. She also understands that She can call clinic at any time with any questions, concerns, or complaints.    No matching staging information was found for the patient.  Roxana Hires, MD   11/10/2015 11:28 AM

## 2015-11-11 LAB — FOLATE RBC
Folate, RBC: >2131 ng/mL (ref >498)
HEMATOCRIT: 29.1 % — AB (ref 34.0–46.6)

## 2015-11-11 LAB — HAPTOGLOBIN: Haptoglobin: 178 mg/dL (ref 34–200)

## 2015-11-16 ENCOUNTER — Other Ambulatory Visit: Payer: Self-pay | Admitting: *Deleted

## 2015-11-16 DIAGNOSIS — D509 Iron deficiency anemia, unspecified: Secondary | ICD-10-CM

## 2015-11-17 ENCOUNTER — Other Ambulatory Visit: Payer: Self-pay | Admitting: *Deleted

## 2015-11-17 ENCOUNTER — Inpatient Hospital Stay (HOSPITAL_BASED_OUTPATIENT_CLINIC_OR_DEPARTMENT_OTHER): Payer: Medicare Other | Admitting: Internal Medicine

## 2015-11-17 ENCOUNTER — Inpatient Hospital Stay: Payer: Medicare Other

## 2015-11-17 VITALS — Temp 98.0°F | Ht 59.0 in | Wt 197.1 lb

## 2015-11-17 DIAGNOSIS — D649 Anemia, unspecified: Secondary | ICD-10-CM

## 2015-11-17 DIAGNOSIS — Z79899 Other long term (current) drug therapy: Secondary | ICD-10-CM | POA: Diagnosis not present

## 2015-11-17 DIAGNOSIS — D509 Iron deficiency anemia, unspecified: Secondary | ICD-10-CM

## 2015-11-17 DIAGNOSIS — I10 Essential (primary) hypertension: Secondary | ICD-10-CM | POA: Diagnosis not present

## 2015-11-17 DIAGNOSIS — R0602 Shortness of breath: Secondary | ICD-10-CM | POA: Diagnosis not present

## 2015-11-17 LAB — CBC WITH DIFFERENTIAL/PLATELET
Basophils Absolute: 0.1 10*3/uL (ref 0–0.1)
Basophils Relative: 1 %
Eosinophils Absolute: 0.1 10*3/uL (ref 0–0.7)
Eosinophils Relative: 1 %
HEMATOCRIT: 32.3 % — AB (ref 35.0–47.0)
HEMOGLOBIN: 9.8 g/dL — AB (ref 12.0–16.0)
LYMPHS ABS: 1 10*3/uL (ref 1.0–3.6)
Lymphocytes Relative: 14 %
MCH: 28.6 pg (ref 26.0–34.0)
MCHC: 30.2 g/dL — AB (ref 32.0–36.0)
MCV: 94.8 fL (ref 80.0–100.0)
MONO ABS: 0.5 10*3/uL (ref 0.2–0.9)
NEUTROS ABS: 5.7 10*3/uL (ref 1.4–6.5)
Platelets: 364 10*3/uL (ref 150–440)
RBC: 3.41 MIL/uL — ABNORMAL LOW (ref 3.80–5.20)
RDW: 15.6 % — AB (ref 11.5–14.5)
WBC: 7.4 10*3/uL (ref 3.6–11.0)

## 2015-11-17 NOTE — Progress Notes (Signed)
[place] @ [facility] Telephone:([phone]  Fax:(336) [place]: October 03, 1941  MR#: [id number]  XUX#:[id number]  Patient Care Team: Lucille Passy, MD as PCP - General [place] Schimke, MD as Consulting Physician (Internal Medicine)  CHIEF COMPLAINT:  Chief Complaint  Patient presents with  . Anemia     No history exists.   Melanie Bird has been followed by Dr. Inez Pilgrim in the past for alternating anemia and erythrocytosis. Patient was on iron supplementation for an extended period of time, but couldn't tolerate it, so was tapered off. Unfortunately, patient is a poor historian and obtaining exact timeline events is very difficult. Previous workup showed no evidence of primary hematologic malignancy or other medical conditions which would explain minimally elevated hemoglobin. No flowsheet data found.  INTERVAL HISTORY:  Since last appointment with Dr. Inez Pilgrim patient has been complaining of slowly progressing shortness of breath, weakness. She believes that the anemia has been primary reason for her complaints. She is stopped taking iron supplementation about a year ago. She does not endorse any obvious bleeding, jaundice, surgical operation or injury. She feels short of breath while performing regular activities of daily living. She denies chest pain, dizziness, weakness in arms or legs, diarrhea, easy bruising.   REVIEW OF SYSTEMS:   As per HPI. Otherwise, a complete review of systems is negatve.  PAST MEDICAL HISTORY: Past Medical History  Diagnosis Date  . Anemia   . Hypertension     PAST SURGICAL HISTORY: No past surgical history on file.  FAMILY HISTORY Family History  Problem Relation Age of Onset  . Diabetes Father   . Hypertension Sister     ADVANCED DIRECTIVES:  No flowsheet data found.  HEALTH MAINTENANCE: Social History  Substance Use Topics  . Smoking status: Never Smoker   . Smokeless tobacco: Never Used  . Alcohol Use: No      No  Known Allergies  Current Outpatient Prescriptions  Medication Sig Dispense Refill  . FeFum-FePo-FA-B Cmp-C-Zn-Mn-Cu (SE-TAN PLUS) 162-115.2-1 MG CAPS Take 1 capsule by mouth daily. 30 capsule 11  . metoprolol tartrate (LOPRESSOR) 25 MG tablet TAKE 1 TABLET (25 MG TOTAL) BY MOUTH 2 (TWO) TIMES DAILY. 60 tablet 11  . Multiple Vitamin (MULTIVITAMIN) tablet Take 1 tablet by mouth daily.    Marland Kitchen omeprazole (PRILOSEC) 20 MG capsule Take 1 capsule (20 mg total) by mouth daily. 30 capsule 6  . senna-docusate (SENNA S) 8.6-50 MG per tablet Take 1 tablet by mouth 2 (two) times daily. (Patient taking differently: Take 1 tablet by mouth at bedtime as needed. ) 60 tablet 2  . Vitamin D, Ergocalciferol, (DRISDOL) 50000 UNITS CAPS capsule Take 1 capsule (50,000 Units total) by mouth every 7 (seven) days. 6 capsule 0   No current facility-administered medications for this visit.    OBJECTIVE:  Filed Vitals:   11/17/15 1125  Temp: 98 F (36.7 C)     Body mass index is 39.79 kg/(m^2).    ECOG FS:1 - Symptomatic but completely ambulatory Filed Weights   11/17/15 1125  Weight: 197 lb 1.5 oz (89.4 kg)    PHYSICAL EXAM: Temp(Src) 98 F (36.7 C) (Tympanic)  Ht 4' 11" (1.499 m)  Wt 197 lb 1.5 oz (89.4 kg)  BMI 39.79 kg/m2  General Appearance:    Alert, cooperative, no distress, appears stated age morbidly obese Caucasian female   Head:    Normocephalic, without obvious abnormality, atraumatic  Eyes:    PERRL, conjunctiva/corneas clear,  EOM's intact, fundi    benign, both eyes  Ears:    Normal TM's and external ear canals, both ears  Nose:   Nares normal, septum midline, mucosa normal, no drainage    or sinus tenderness  Throat:   Lips, mucosa, and tongue normal; teeth and gums normal  Neck:   Supple, symmetrical, trachea midline, no adenopathy;    thyroid:  no enlargement/tenderness/nodules; no carotid   bruit or JVD  Back:     Symmetric, no curvature, ROM normal, no CVA tenderness  Lungs:      Clear to auscultation bilaterally, respirations unlabored  Chest Wall:    No tenderness or deformity   Heart:    Regular rate and rhythm, S1 and S2 normal, no murmur, rub   or gallop  Breast Exam:    deferred by the patient   Abdomen:     Soft, non-tender, bowel sounds active all four quadrants,    no masses, no organomegaly. Physical exam is difficult due to patient's body habitus   Genitalia:   deferred by patient   Rectal:   deferred by patient   Extremities:   Extremities normal, atraumatic, no cyanosis or edema  Pulses:   2+ and symmetric all extremities  Skin:   Skin color, texture, turgor normal, no rashes or lesions  Lymph nodes:   Cervical, supraclavicular, and axillary nodes normal  Neurologic:   CNII-XII intact, normal strength, sensation and reflexes    throughout      LAB RESULTS:  Recent Results (from the past 2160 hour(s))  CBC with Differential/Platelet     Status: Abnormal   Collection Time: 10/26/15  2:55 PM  Result Value Ref Range   WBC 13.6 Repeated and verified X2. (H) 4.0 - 10.5 K/uL   RBC 2.93 (L) 3.87 - 5.11 Mil/uL   Hemoglobin 8.4 (L) 12.0 - 15.0 g/dL   HCT 27.6 (L) 36.0 - 46.0 %   MCV 94.4 78.0 - 100.0 fl   MCHC 30.5 30.0 - 36.0 g/dL   RDW 17.1 (H) 11.5 - 15.5 %   Platelets 548.0 (H) 150.0 - 400.0 K/uL   Neutrophils Relative % 86.7 Repeated and verified X2. (H) 43.0 - 77.0 %   Lymphocytes Relative 8.6 (L) 12.0 - 46.0 %   Monocytes Relative 4.1 3.0 - 12.0 %   Eosinophils Relative 0.0 0.0 - 5.0 %   Basophils Relative 0.6 0.0 - 3.0 %   Neutro Abs 11.8 (H) 1.4 - 7.7 K/uL   Lymphs Abs 1.2 0.7 - 4.0 K/uL   Monocytes Absolute 0.6 0.1 - 1.0 K/uL   Eosinophils Absolute 0.0 0.0 - 0.7 K/uL   Basophils Absolute 0.1 0.0 - 0.1 K/uL  Ferritin     Status: None   Collection Time: 10/26/15  2:55 PM  Result Value Ref Range   Ferritin 17.8 10.0 - 291.0 ng/mL  H. pylori antibody, IgG     Status: None   Collection Time: 10/26/15  2:55 PM  Result Value Ref Range    H Pylori IgG Negative Negative  CBC with Differential     Status: Abnormal   Collection Time: 11/10/15 11:39 AM  Result Value Ref Range   WBC 8.8 3.6 - 11.0 K/uL   RBC 3.09 (L) 3.80 - 5.20 MIL/uL   Hemoglobin 8.9 (L) 12.0 - 16.0 g/dL    Comment: RESULT REPEATED AND VERIFIED   HCT 30.1 (L) 35.0 - 47.0 %    Comment: RESULT REPEATED AND VERIFIED   MCV 97.7  80.0 - 100.0 fL   MCH 28.9 26.0 - 34.0 pg   MCHC 29.6 (L) 32.0 - 36.0 g/dL   RDW 16.5 (H) 11.5 - 14.5 %   Platelets 495 (H) 150 - 440 K/uL   Neutrophils Relative % 77% %   Neutro Abs 6.9 (H) 1.4 - 6.5 K/uL   Lymphocytes Relative 15% %   Lymphs Abs 1.3 1.0 - 3.6 K/uL   Monocytes Relative 6% %   Monocytes Absolute 0.5 0.2 - 0.9 K/uL   Eosinophils Relative 1% %   Eosinophils Absolute 0.1 0 - 0.7 K/uL   Basophils Relative 1% %   Basophils Absolute 0.1 0 - 0.1 K/uL  Save smear     Status: None   Collection Time: 11/10/15 11:39 AM  Result Value Ref Range   Smear Review SMEAR STAINED AND AVAILABLE FOR REVIEW   Comprehensive metabolic panel     Status: Abnormal   Collection Time: 11/10/15 12:27 PM  Result Value Ref Range   Sodium 143 135 - 145 mmol/L   Potassium 4.3 3.5 - 5.1 mmol/L   Chloride 113 (H) 101 - 111 mmol/L   CO2 24 22 - 32 mmol/L   Glucose, Bld 158 (H) 65 - 99 mg/dL   BUN 11 6 - 20 mg/dL   Creatinine, Ser 0.74 0.44 - 1.00 mg/dL   Calcium 9.5 8.9 - 10.3 mg/dL   Total Protein 5.9 (L) 6.5 - 8.1 g/dL   Albumin 3.2 (L) 3.5 - 5.0 g/dL   AST 18 15 - 41 U/L   ALT 12 (L) 14 - 54 U/L   Alkaline Phosphatase 71 38 - 126 U/L   Total Bilirubin 0.2 (L) 0.3 - 1.2 mg/dL   GFR calc non Af Amer >60 >60 mL/min   GFR calc Af Amer >60 >60 mL/min    Comment: (NOTE) The eGFR has been calculated using the CKD EPI equation. This calculation has not been validated in all clinical situations. eGFR's persistently <60 mL/min signify possible Chronic Kidney Disease.    Anion gap 6 5 - 15  Lactate dehydrogenase     Status: None    Collection Time: 11/10/15 12:27 PM  Result Value Ref Range   LDH 130 98 - 192 U/L  Haptoglobin     Status: None   Collection Time: 11/10/15 12:27 PM  Result Value Ref Range   Haptoglobin 178 34 - 200 mg/dL    Comment: (NOTE) Performed At: [facility] [place], [place] [id number] Lindon Romp MD [id number]   Reticulocytes     Status: Abnormal   Collection Time: 11/10/15 12:27 PM  Result Value Ref Range   Retic Ct Pct 6.9 (H) 0.4 - 3.1 %   RBC. 3.14 (L) 3.80 - 5.20 MIL/uL   Retic Count, Manual 216.7 (H) 19.0 - 183.0 K/uL  Vitamin B12     Status: None   Collection Time: 11/10/15 12:27 PM  Result Value Ref Range   Vitamin B-12 881 180 - 914 pg/mL    Comment: (NOTE) This assay is not validated for testing neonatal or myeloproliferative syndrome specimens for Vitamin B12 levels. Performed at [facility]   Folate RBC     Status: Abnormal   Collection Time: 11/10/15 12:27 PM  Result Value Ref Range   Folate, Hemolysate >620.0 Not Estab. ng/mL   Hematocrit 29.1 (L) 34.0 - 46.6 %   Folate, RBC >2131 >498 ng/mL    Comment: (NOTE) Performed At: [facility] [address]  [address] [place], [place] [id number] Lindon Romp MD [id number]   DAT, polyspecific, AHG ([place] only)     Status: None   Collection Time: 11/10/15 12:27 PM  Result Value Ref Range   Polyspecific AHG test NEG   CBC with Differential     Status: Abnormal   Collection Time: 11/17/15 11:00 AM  Result Value Ref Range   WBC 7.4 3.6 - 11.0 K/uL   RBC 3.41 (L) 3.80 - 5.20 MIL/uL   Hemoglobin 9.8 (L) 12.0 - 16.0 g/dL    Comment: RESULT REPEATED AND VERIFIED   HCT 32.3 (L) 35.0 - 47.0 %    Comment: RESULT REPEATED AND VERIFIED   MCV 94.8 80.0 - 100.0 fL   MCH 28.6 26.0 - 34.0 pg   MCHC 30.2 (L) 32.0 - 36.0 g/dL   RDW 15.6 (H) 11.5 - 14.5 %   Platelets 364 150 - 440 K/uL   Neutrophils Relative % 77% %   Neutro Abs 5.7 1.4 - 6.5 K/uL   Lymphocytes Relative 14% %   Lymphs  Abs 1.0 1.0 - 3.6 K/uL   Monocytes Relative 7% %   Monocytes Absolute 0.5 0.2 - 0.9 K/uL   Eosinophils Relative 1% %   Eosinophils Absolute 0.1 0 - 0.7 K/uL   Basophils Relative 1% %   Basophils Absolute 0.1 0 - 0.1 K/uL      STUDIES: No results found.  ASSESSMENT: Anemia-likely multifactorial. Elevated MCV is inconsistent with iron deficiency anemia, but recent ferritin is fairly low, although still within normal range. The patient denies any history of bleeding, both recent and remote. She follows a regular diet, not a vegetarian, denies diarrhea which would lead to malabsorption. She also denied jaundice. It is somewhat difficult to clearly pinpoint the reason for new anemia. We will check vitamin B12 and folate levels, LDH and Coombs test along with haptoglobin to rule out hemolytic anemia, and will check reticulocyte count and review peripheral smear and renal function. I suspect that Ms. Mayer indeed had a blood loss/destruction, which could be supported by the fact that her platelet count was elevated (it is lower today). She is fairly stable clinically, so she does not require any blood transfusions at this point.  She'll return to our clinic in 1 week to discuss the results of the workup.   Patient expressed understanding and was in agreement with this plan. She also understands that She can call clinic at any time with any questions, concerns, or complaints.    No matching staging information was found for the patient.  Roxana Hires, MD   11/17/2015 11:33 AM      [place] @ [facility] Telephone:(336) [phone]  Fax:(336) [place]: Nov 27, 1941  MR#: [id number]  FWY#:[id number]  Patient Care Team: Lucille Passy, MD as PCP - General Dallas Schimke, MD as Consulting Physician (Internal Medicine)  CHIEF COMPLAINT:  Chief Complaint  Patient presents with  . Anemia     No history exists.   Mrs. Hollis has been followed by Dr. Inez Pilgrim in the past  for alternating anemia and erythrocytosis. Patient was on iron supplementation for an extended period of time, but couldn't tolerate it, so was tapered off. Unfortunately, patient is a poor historian and obtaining exact timeline events is very difficult. Previous workup showed no evidence of primary hematologic malignancy or other medical conditions which would explain minimally elevated hemoglobin. No flowsheet data found.  INTERVAL HISTORY:  Since last appointment Melanie Bird has done significantly better, claiming that she has been feeling more energetic, having less fatigue. She's been able to tolerate her iron supplementation fairly well with once a day dosage. She does not endorse any obvious bleeding, jaundice, surgical operation or injury. She feels short of breath while performing regular activities of daily living. She denies chest pain, dizziness, weakness in arms or legs, diarrhea, easy bruising.   REVIEW OF SYSTEMS:   As per HPI. Otherwise, a complete review of systems is negatve.  PAST MEDICAL HISTORY: Past Medical History  Diagnosis Date  . Anemia   . Hypertension     PAST SURGICAL HISTORY: No past surgical history on file.  FAMILY HISTORY Family History  Problem Relation Age of Onset  . Diabetes Father   . Hypertension Sister     ADVANCED DIRECTIVES:  No flowsheet data found.  HEALTH MAINTENANCE: Social History  Substance Use Topics  . Smoking status: Never Smoker   . Smokeless tobacco: Never Used  . Alcohol Use: No      No Known Allergies  Current Outpatient Prescriptions  Medication Sig Dispense Refill  . FeFum-FePo-FA-B Cmp-C-Zn-Mn-Cu (SE-TAN PLUS) 162-115.2-1 MG CAPS Take 1 capsule by mouth daily. 30 capsule 11  . metoprolol tartrate (LOPRESSOR) 25 MG tablet TAKE 1 TABLET (25 MG TOTAL) BY MOUTH 2 (TWO) TIMES DAILY. 60 tablet 11  . Multiple Vitamin (MULTIVITAMIN) tablet Take 1 tablet by mouth daily.    Marland Kitchen omeprazole (PRILOSEC) 20 MG capsule Take 1  capsule (20 mg total) by mouth daily. 30 capsule 6  . senna-docusate (SENNA S) 8.6-50 MG per tablet Take 1 tablet by mouth 2 (two) times daily. (Patient taking differently: Take 1 tablet by mouth at bedtime as needed. ) 60 tablet 2  . Vitamin D, Ergocalciferol, (DRISDOL) 50000 UNITS CAPS capsule Take 1 capsule (50,000 Units total) by mouth every 7 (seven) days. 6 capsule 0   No current facility-administered medications for this visit.    OBJECTIVE:  Filed Vitals:   11/17/15 1125  Temp: 98 F (36.7 C)     Body mass index is 39.79 kg/(m^2).    ECOG FS:1 - Symptomatic but completely ambulatory Filed Weights   11/17/15 1125  Weight: 197 lb 1.5 oz (89.4 kg)    PHYSICAL EXAM: Temp(Src) 98 F (36.7 C) (Tympanic)  Ht 4' 11" (1.499 m)  Wt 197 lb 1.5 oz (89.4 kg)  BMI 39.79 kg/m2  General Appearance:    Alert, cooperative, no distress, appears stated age morbidly obese Caucasian female   Head:    Normocephalic, without obvious abnormality, atraumatic  Eyes:    PERRL, conjunctiva/corneas clear, EOM's intact, fundi    benign, both eyes  Ears:    Normal TM's and external ear canals, both ears  Nose:   Nares normal, septum midline, mucosa normal, no drainage    or sinus tenderness  Throat:   Lips, mucosa, and tongue normal; teeth and gums normal  Neck:   Supple, symmetrical, trachea midline, no adenopathy;    thyroid:  no enlargement/tenderness/nodules; no carotid   bruit or JVD  Back:     Symmetric, no curvature, ROM normal, no CVA tenderness  Lungs:     Clear to auscultation bilaterally, respirations unlabored  Chest Wall:    No tenderness or deformity   Heart:    Regular rate and rhythm, S1 and S2 normal, no murmur, rub   or gallop  Breast Exam:    deferred by the patient   Abdomen:  Soft, non-tender, bowel sounds active all four quadrants,    no masses, no organomegaly. Physical exam is difficult due to patient's body habitus   Genitalia:   deferred by patient   Rectal:    deferred by patient   Extremities:   Extremities normal, atraumatic, no cyanosis or edema  Pulses:   2+ and symmetric all extremities  Skin:   Skin color, texture, turgor normal, no rashes or lesions  Lymph nodes:   Cervical, supraclavicular, and axillary nodes normal  Neurologic:   CNII-XII intact, normal strength, sensation and reflexes    throughout      LAB RESULTS:  Recent Results (from the past 2160 hour(s))  CBC with Differential/Platelet     Status: Abnormal   Collection Time: 10/26/15  2:55 PM  Result Value Ref Range   WBC 13.6 Repeated and verified X2. (H) 4.0 - 10.5 K/uL   RBC 2.93 (L) 3.87 - 5.11 Mil/uL   Hemoglobin 8.4 (L) 12.0 - 15.0 g/dL   HCT 27.6 (L) 36.0 - 46.0 %   MCV 94.4 78.0 - 100.0 fl   MCHC 30.5 30.0 - 36.0 g/dL   RDW 17.1 (H) 11.5 - 15.5 %   Platelets 548.0 (H) 150.0 - 400.0 K/uL   Neutrophils Relative % 86.7 Repeated and verified X2. (H) 43.0 - 77.0 %   Lymphocytes Relative 8.6 (L) 12.0 - 46.0 %   Monocytes Relative 4.1 3.0 - 12.0 %   Eosinophils Relative 0.0 0.0 - 5.0 %   Basophils Relative 0.6 0.0 - 3.0 %   Neutro Abs 11.8 (H) 1.4 - 7.7 K/uL   Lymphs Abs 1.2 0.7 - 4.0 K/uL   Monocytes Absolute 0.6 0.1 - 1.0 K/uL   Eosinophils Absolute 0.0 0.0 - 0.7 K/uL   Basophils Absolute 0.1 0.0 - 0.1 K/uL  Ferritin     Status: None   Collection Time: 10/26/15  2:55 PM  Result Value Ref Range   Ferritin 17.8 10.0 - 291.0 ng/mL  H. pylori antibody, IgG     Status: None   Collection Time: 10/26/15  2:55 PM  Result Value Ref Range   H Pylori IgG Negative Negative  CBC with Differential     Status: Abnormal   Collection Time: 11/10/15 11:39 AM  Result Value Ref Range   WBC 8.8 3.6 - 11.0 K/uL   RBC 3.09 (L) 3.80 - 5.20 MIL/uL   Hemoglobin 8.9 (L) 12.0 - 16.0 g/dL    Comment: RESULT REPEATED AND VERIFIED   HCT 30.1 (L) 35.0 - 47.0 %    Comment: RESULT REPEATED AND VERIFIED   MCV 97.7 80.0 - 100.0 fL   MCH 28.9 26.0 - 34.0 pg   MCHC 29.6 (L) 32.0 - 36.0  g/dL   RDW 16.5 (H) 11.5 - 14.5 %   Platelets 495 (H) 150 - 440 K/uL   Neutrophils Relative % 77% %   Neutro Abs 6.9 (H) 1.4 - 6.5 K/uL   Lymphocytes Relative 15% %   Lymphs Abs 1.3 1.0 - 3.6 K/uL   Monocytes Relative 6% %   Monocytes Absolute 0.5 0.2 - 0.9 K/uL   Eosinophils Relative 1% %   Eosinophils Absolute 0.1 0 - 0.7 K/uL   Basophils Relative 1% %   Basophils Absolute 0.1 0 - 0.1 K/uL  Save smear     Status: None   Collection Time: 11/10/15 11:39 AM  Result Value Ref Range   Smear Review SMEAR STAINED AND AVAILABLE FOR REVIEW   Comprehensive metabolic  panel     Status: Abnormal   Collection Time: 11/10/15 12:27 PM  Result Value Ref Range   Sodium 143 135 - 145 mmol/L   Potassium 4.3 3.5 - 5.1 mmol/L   Chloride 113 (H) 101 - 111 mmol/L   CO2 24 22 - 32 mmol/L   Glucose, Bld 158 (H) 65 - 99 mg/dL   BUN 11 6 - 20 mg/dL   Creatinine, Ser 0.74 0.44 - 1.00 mg/dL   Calcium 9.5 8.9 - 10.3 mg/dL   Total Protein 5.9 (L) 6.5 - 8.1 g/dL   Albumin 3.2 (L) 3.5 - 5.0 g/dL   AST 18 15 - 41 U/L   ALT 12 (L) 14 - 54 U/L   Alkaline Phosphatase 71 38 - 126 U/L   Total Bilirubin 0.2 (L) 0.3 - 1.2 mg/dL   GFR calc non Af Amer >60 >60 mL/min   GFR calc Af Amer >60 >60 mL/min    Comment: (NOTE) The eGFR has been calculated using the CKD EPI equation. This calculation has not been validated in all clinical situations. eGFR's persistently <60 mL/min signify possible Chronic Kidney Disease.    Anion gap 6 5 - 15  Lactate dehydrogenase     Status: None   Collection Time: 11/10/15 12:27 PM  Result Value Ref Range   LDH 130 98 - 192 U/L  Haptoglobin     Status: None   Collection Time: 11/10/15 12:27 PM  Result Value Ref Range   Haptoglobin 178 34 - 200 mg/dL    Comment: (NOTE) Performed At: [facility] [place], [place] [id number] Lindon Romp MD [id number]   Reticulocytes     Status: Abnormal   Collection Time: 11/10/15 12:27 PM  Result Value Ref  Range   Retic Ct Pct 6.9 (H) 0.4 - 3.1 %   RBC. 3.14 (L) 3.80 - 5.20 MIL/uL   Retic Count, Manual 216.7 (H) 19.0 - 183.0 K/uL  Vitamin B12     Status: None   Collection Time: 11/10/15 12:27 PM  Result Value Ref Range   Vitamin B-12 881 180 - 914 pg/mL    Comment: (NOTE) This assay is not validated for testing neonatal or myeloproliferative syndrome specimens for Vitamin B12 levels. Performed at [facility]   Folate RBC     Status: Abnormal   Collection Time: 11/10/15 12:27 PM  Result Value Ref Range   Folate, Hemolysate >620.0 Not Estab. ng/mL   Hematocrit 29.1 (L) 34.0 - 46.6 %   Folate, RBC >2131 >498 ng/mL    Comment: (NOTE) Performed At: [facility] [place], [place] [id number] Lindon Romp MD [id number]   DAT, polyspecific, AHG ([place] only)     Status: None   Collection Time: 11/10/15 12:27 PM  Result Value Ref Range   Polyspecific AHG test NEG   CBC with Differential     Status: Abnormal   Collection Time: 11/17/15 11:00 AM  Result Value Ref Range   WBC 7.4 3.6 - 11.0 K/uL   RBC 3.41 (L) 3.80 - 5.20 MIL/uL   Hemoglobin 9.8 (L) 12.0 - 16.0 g/dL    Comment: RESULT REPEATED AND VERIFIED   HCT 32.3 (L) 35.0 - 47.0 %    Comment: RESULT REPEATED AND VERIFIED   MCV 94.8 80.0 - 100.0 fL   MCH 28.6 26.0 - 34.0 pg   MCHC 30.2 (L) 32.0 - 36.0 g/dL   RDW 15.6 (H) 11.5 - 14.5 %   Platelets  364 150 - 440 K/uL   Neutrophils Relative % 77% %   Neutro Abs 5.7 1.4 - 6.5 K/uL   Lymphocytes Relative 14% %   Lymphs Abs 1.0 1.0 - 3.6 K/uL   Monocytes Relative 7% %   Monocytes Absolute 0.5 0.2 - 0.9 K/uL   Eosinophils Relative 1% %   Eosinophils Absolute 0.1 0 - 0.7 K/uL   Basophils Relative 1% %   Basophils Absolute 0.1 0 - 0.1 K/uL   I personally reviewed peripheral blood smear, which showed mild anemia with normal morphology of all 3 lineages.    STUDIES: No results found.  ASSESSMENT: Anemia-likely multifactorial. However, the exact  reason for anemia remains to be elusive, since she has no evidence of vitamin deficiency, hemolysis, or hematologic malignancy. Relatively rapid development of anemia with increased platelet count and white blood cells would suggest an acute process, however, no clear source of bleeding has been identified. Clinically she has done significantly better over the past week, so we will continue to monitor her clinically and hematologic parameters, which hopefully, will continue to improve. She will return to our clinic in 1 month for a follow-up appointment and CBC check. She should continue with iron supplementation.     Patient expressed understanding and was in agreement with this plan. She also understands that She can call clinic at any time with any questions, concerns, or complaints.    No matching staging information was found for the patient.  Roxana Hires, MD   11/17/2015 1:33 PM

## 2015-11-17 NOTE — Progress Notes (Signed)
Patient is here for follow-up of anemia. She is taking her oral iron once a day, because BID upset her stomach. She states that she is still fatigued but is feeling much better. She does have some off and on SOB and has been wheezing some.

## 2015-11-24 ENCOUNTER — Other Ambulatory Visit: Payer: Self-pay | Admitting: Family Medicine

## 2015-11-24 NOTE — Telephone Encounter (Signed)
Pt left v/m requesting refill vit D 2 to [facility]; pt has one more pill to take next week; pt thinks this is helping her and request to continue taking the Vit D 2. Pt request cb.

## 2015-11-30 ENCOUNTER — Other Ambulatory Visit (INDEPENDENT_AMBULATORY_CARE_PROVIDER_SITE_OTHER): Payer: Medicare Other

## 2015-11-30 DIAGNOSIS — D509 Iron deficiency anemia, unspecified: Secondary | ICD-10-CM

## 2015-11-30 LAB — CBC WITH DIFFERENTIAL/PLATELET
BASOS PCT: 0.8 % (ref 0.0–3.0)
Basophils Absolute: 0.1 10*3/uL (ref 0.0–0.1)
EOS ABS: 0.1 10*3/uL (ref 0.0–0.7)
Eosinophils Relative: 0.8 % (ref 0.0–5.0)
HEMATOCRIT: 42 % (ref 36.0–46.0)
Hemoglobin: 12.8 g/dL (ref 12.0–15.0)
LYMPHS ABS: 1.6 10*3/uL (ref 0.7–4.0)
Lymphocytes Relative: 18.4 % (ref 12.0–46.0)
MCHC: 30.4 g/dL (ref 30.0–36.0)
MCV: 93.6 fl (ref 78.0–100.0)
MONO ABS: 0.4 10*3/uL (ref 0.1–1.0)
Monocytes Relative: 5.1 % (ref 3.0–12.0)
NEUTROS ABS: 6.3 10*3/uL (ref 1.4–7.7)
Neutrophils Relative %: 74.9 % (ref 43.0–77.0)
PLATELETS: 385 10*3/uL (ref 150.0–400.0)
RBC: 4.49 Mil/uL (ref 3.87–5.11)
RDW: 14.8 % (ref 11.5–15.5)
WBC: 8.5 10*3/uL (ref 4.0–10.5)

## 2015-12-18 ENCOUNTER — Inpatient Hospital Stay: Payer: Medicare Other

## 2016-01-01 ENCOUNTER — Inpatient Hospital Stay: Payer: Private Health Insurance - Indemnity

## 2016-01-08 ENCOUNTER — Inpatient Hospital Stay: Payer: Medicare Other | Attending: Internal Medicine | Admitting: Internal Medicine

## 2016-01-08 ENCOUNTER — Encounter: Payer: Self-pay | Admitting: Internal Medicine

## 2016-01-08 ENCOUNTER — Inpatient Hospital Stay: Payer: Medicare Other

## 2016-01-08 VITALS — BP 143/92 | HR 77 | Temp 97.8°F | Resp 18 | Ht 59.0 in | Wt 191.1 lb

## 2016-01-08 DIAGNOSIS — I10 Essential (primary) hypertension: Secondary | ICD-10-CM | POA: Diagnosis not present

## 2016-01-08 DIAGNOSIS — D509 Iron deficiency anemia, unspecified: Secondary | ICD-10-CM

## 2016-01-08 DIAGNOSIS — Z79899 Other long term (current) drug therapy: Secondary | ICD-10-CM | POA: Diagnosis not present

## 2016-01-08 DIAGNOSIS — R0602 Shortness of breath: Secondary | ICD-10-CM | POA: Diagnosis not present

## 2016-01-08 DIAGNOSIS — D649 Anemia, unspecified: Secondary | ICD-10-CM

## 2016-01-08 LAB — CBC WITH DIFFERENTIAL/PLATELET
BASOS PCT: 1 %
Basophils Absolute: 0.1 10*3/uL (ref 0–0.1)
EOS ABS: 0.1 10*3/uL (ref 0–0.7)
Eosinophils Relative: 1 %
HCT: 44.1 % (ref 35.0–47.0)
HEMOGLOBIN: 14.2 g/dL (ref 12.0–16.0)
Lymphocytes Relative: 12 %
Lymphs Abs: 1.1 10*3/uL (ref 1.0–3.6)
MCH: 28.4 pg (ref 26.0–34.0)
MCHC: 32.3 g/dL (ref 32.0–36.0)
MCV: 87.8 fL (ref 80.0–100.0)
Monocytes Absolute: 0.4 10*3/uL (ref 0.2–0.9)
Monocytes Relative: 4 %
NEUTROS PCT: 82 %
Neutro Abs: 8.1 10*3/uL — ABNORMAL HIGH (ref 1.4–6.5)
Platelets: 267 10*3/uL (ref 150–440)
RBC: 5.02 MIL/uL (ref 3.80–5.20)
RDW: 14.3 % (ref 11.5–14.5)
WBC: 9.7 10*3/uL (ref 3.6–11.0)

## 2016-01-08 LAB — FERRITIN: FERRITIN: 56 ng/mL (ref 11–307)

## 2016-01-08 NOTE — Progress Notes (Signed)
[place] @ [facility] Telephone:(336) [phone]  Fax:(336) [place]: 11/20/1941  MR#: [id number]  INO#:[id number]  Patient Care Team: Lucille Passy, MD as PCP - General [place] Schimke, MD as Consulting Physician (Internal Medicine)  CHIEF COMPLAINT:  No chief complaint on file.    No history exists.   Melanie Bird has been followed by Dr. Inez Pilgrim in the past for alternating anemia and erythrocytosis. Patient was on iron supplementation for an extended period of time, but couldn't tolerate it, so was tapered off. Unfortunately, patient is a poor historian and obtaining exact timeline events is very difficult. Previous workup showed no evidence of primary hematologic malignancy or other medical conditions which would explain minimally elevated hemoglobin. No flowsheet data found.  INTERVAL HISTORY:  Since last appointment with Dr. Inez Pilgrim patient has been complaining of slowly progressing shortness of breath, weakness. She believes that the anemia has been primary reason for her complaints. She is stopped taking iron supplementation about a year ago. She does not endorse any obvious bleeding, jaundice, surgical operation or injury. She feels short of breath while performing regular activities of daily living. She denies chest pain, dizziness, weakness in arms or legs, diarrhea, easy bruising.   REVIEW OF SYSTEMS:   As per HPI. Otherwise, a complete review of systems is negatve.  PAST MEDICAL HISTORY: Past Medical History  Diagnosis Date  . Anemia   . Hypertension     PAST SURGICAL HISTORY: No past surgical history on file.  FAMILY HISTORY Family History  Problem Relation Age of Onset  . Diabetes Father   . Hypertension Sister     ADVANCED DIRECTIVES:  No flowsheet data found.  HEALTH MAINTENANCE: Social History  Substance Use Topics  . Smoking status: Never Smoker   . Smokeless tobacco: Never Used  . Alcohol Use: No      No Known  Allergies  Current Outpatient Prescriptions  Medication Sig Dispense Refill  . FeFum-FePo-FA-B Cmp-C-Zn-Mn-Cu (SE-TAN PLUS) 162-115.2-1 MG CAPS Take 1 capsule by mouth daily. 30 capsule 11  . metoprolol tartrate (LOPRESSOR) 25 MG tablet TAKE 1 TABLET (25 MG TOTAL) BY MOUTH 2 (TWO) TIMES DAILY. 60 tablet 11  . Multiple Vitamin (MULTIVITAMIN) tablet Take 1 tablet by mouth daily.    Marland Kitchen omeprazole (PRILOSEC) 20 MG capsule Take 1 capsule (20 mg total) by mouth daily. 30 capsule 6  . senna-docusate (SENNA S) 8.6-50 MG per tablet Take 1 tablet by mouth 2 (two) times daily. (Patient taking differently: Take 1 tablet by mouth at bedtime as needed. ) 60 tablet 2  . Vitamin D, Ergocalciferol, (DRISDOL) 50000 UNITS CAPS capsule TAKE 1 CAPSULE (50,000 UNITS TOTAL) BY MOUTH EVERY 7 (SEVEN) DAYS. 6 capsule 0   No current facility-administered medications for this visit.    OBJECTIVE:  There were no vitals filed for this visit.   There is no weight on file to calculate BMI.    ECOG FS:1 - Symptomatic but completely ambulatory There were no vitals filed for this visit.  PHYSICAL EXAM: There were no vitals taken for this visit.  General Appearance:    Alert, cooperative, no distress, appears stated age morbidly obese Caucasian female   Head:    Normocephalic, without obvious abnormality, atraumatic  Eyes:    PERRL, conjunctiva/corneas clear, EOM's intact, fundi    benign, both eyes  Ears:    Normal TM's and external ear canals, both ears  Nose:   Nares normal, septum midline, mucosa normal,  no drainage    or sinus tenderness  Throat:   Lips, mucosa, and tongue normal; teeth and gums normal  Neck:   Supple, symmetrical, trachea midline, no adenopathy;    thyroid:  no enlargement/tenderness/nodules; no carotid   bruit or JVD  Back:     Symmetric, no curvature, ROM normal, no CVA tenderness  Lungs:     Clear to auscultation bilaterally, respirations unlabored  Chest Wall:    No tenderness or deformity    Heart:    Regular rate and rhythm, S1 and S2 normal, no murmur, rub   or gallop  Breast Exam:    deferred by the patient   Abdomen:     Soft, non-tender, bowel sounds active all four quadrants,    no masses, no organomegaly. Physical exam is difficult due to patient's body habitus   Genitalia:   deferred by patient   Rectal:   deferred by patient   Extremities:   Extremities normal, atraumatic, no cyanosis or edema  Pulses:   2+ and symmetric all extremities  Skin:   Skin color, texture, turgor normal, no rashes or lesions  Lymph nodes:   Cervical, supraclavicular, and axillary nodes normal  Neurologic:   CNII-XII intact, normal strength, sensation and reflexes    throughout      LAB RESULTS:  Recent Results (from the past 2160 hour(s))  CBC with Differential/Platelet     Status: Abnormal   Collection Time: 10/26/15  2:55 PM  Result Value Ref Range   WBC 13.6 Repeated and verified X2. (H) 4.0 - 10.5 K/uL   RBC 2.93 (L) 3.87 - 5.11 Mil/uL   Hemoglobin 8.4 (L) 12.0 - 15.0 g/dL   HCT 27.6 (L) 36.0 - 46.0 %   MCV 94.4 78.0 - 100.0 fl   MCHC 30.5 30.0 - 36.0 g/dL   RDW 17.1 (H) 11.5 - 15.5 %   Platelets 548.0 (H) 150.0 - 400.0 K/uL   Neutrophils Relative % 86.7 Repeated and verified X2. (H) 43.0 - 77.0 %   Lymphocytes Relative 8.6 (L) 12.0 - 46.0 %   Monocytes Relative 4.1 3.0 - 12.0 %   Eosinophils Relative 0.0 0.0 - 5.0 %   Basophils Relative 0.6 0.0 - 3.0 %   Neutro Abs 11.8 (H) 1.4 - 7.7 K/uL   Lymphs Abs 1.2 0.7 - 4.0 K/uL   Monocytes Absolute 0.6 0.1 - 1.0 K/uL   Eosinophils Absolute 0.0 0.0 - 0.7 K/uL   Basophils Absolute 0.1 0.0 - 0.1 K/uL  Ferritin     Status: None   Collection Time: 10/26/15  2:55 PM  Result Value Ref Range   Ferritin 17.8 10.0 - 291.0 ng/mL  H. pylori antibody, IgG     Status: None   Collection Time: 10/26/15  2:55 PM  Result Value Ref Range   H Pylori IgG Negative Negative  CBC with Differential     Status: Abnormal   Collection Time:  11/10/15 11:39 AM  Result Value Ref Range   WBC 8.8 3.6 - 11.0 K/uL   RBC 3.09 (L) 3.80 - 5.20 MIL/uL   Hemoglobin 8.9 (L) 12.0 - 16.0 g/dL    Comment: RESULT REPEATED AND VERIFIED   HCT 30.1 (L) 35.0 - 47.0 %    Comment: RESULT REPEATED AND VERIFIED   MCV 97.7 80.0 - 100.0 fL   MCH 28.9 26.0 - 34.0 pg   MCHC 29.6 (L) 32.0 - 36.0 g/dL   RDW 16.5 (H) 11.5 - 14.5 %  Platelets 495 (H) 150 - 440 K/uL   Neutrophils Relative % 77% %   Neutro Abs 6.9 (H) 1.4 - 6.5 K/uL   Lymphocytes Relative 15% %   Lymphs Abs 1.3 1.0 - 3.6 K/uL   Monocytes Relative 6% %   Monocytes Absolute 0.5 0.2 - 0.9 K/uL   Eosinophils Relative 1% %   Eosinophils Absolute 0.1 0 - 0.7 K/uL   Basophils Relative 1% %   Basophils Absolute 0.1 0 - 0.1 K/uL  Save smear     Status: None   Collection Time: 11/10/15 11:39 AM  Result Value Ref Range   Smear Review SMEAR STAINED AND AVAILABLE FOR REVIEW   Comprehensive metabolic panel     Status: Abnormal   Collection Time: 11/10/15 12:27 PM  Result Value Ref Range   Sodium 143 135 - 145 mmol/L   Potassium 4.3 3.5 - 5.1 mmol/L   Chloride 113 (H) 101 - 111 mmol/L   CO2 24 22 - 32 mmol/L   Glucose, Bld 158 (H) 65 - 99 mg/dL   BUN 11 6 - 20 mg/dL   Creatinine, Ser 0.74 0.44 - 1.00 mg/dL   Calcium 9.5 8.9 - 10.3 mg/dL   Total Protein 5.9 (L) 6.5 - 8.1 g/dL   Albumin 3.2 (L) 3.5 - 5.0 g/dL   AST 18 15 - 41 U/L   ALT 12 (L) 14 - 54 U/L   Alkaline Phosphatase 71 38 - 126 U/L   Total Bilirubin 0.2 (L) 0.3 - 1.2 mg/dL   GFR calc non Af Amer >60 >60 mL/min   GFR calc Af Amer >60 >60 mL/min    Comment: (NOTE) The eGFR has been calculated using the CKD EPI equation. This calculation has not been validated in all clinical situations. eGFR's persistently <60 mL/min signify possible Chronic Kidney Disease.    Anion gap 6 5 - 15  Lactate dehydrogenase     Status: None   Collection Time: 11/10/15 12:27 PM  Result Value Ref Range   LDH 130 98 - 192 U/L  Haptoglobin      Status: None   Collection Time: 11/10/15 12:27 PM  Result Value Ref Range   Haptoglobin 178 34 - 200 mg/dL    Comment: (NOTE) Performed At: [facility] [place], [place] [id number] Lindon Romp MD QI:[id number]   Reticulocytes     Status: Abnormal   Collection Time: 11/10/15 12:27 PM  Result Value Ref Range   Retic Ct Pct 6.9 (H) 0.4 - 3.1 %   RBC. 3.14 (L) 3.80 - 5.20 MIL/uL   Retic Count, Manual 216.7 (H) 19.0 - 183.0 K/uL  Vitamin B12     Status: None   Collection Time: 11/10/15 12:27 PM  Result Value Ref Range   Vitamin B-12 881 180 - 914 pg/mL    Comment: (NOTE) This assay is not validated for testing neonatal or myeloproliferative syndrome specimens for Vitamin B12 levels. Performed at [facility]   Folate RBC     Status: Abnormal   Collection Time: 11/10/15 12:27 PM  Result Value Ref Range   Folate, Hemolysate >620.0 Not Estab. ng/mL   Hematocrit 29.1 (L) 34.0 - 46.6 %   Folate, RBC >2131 >498 ng/mL    Comment: (NOTE) Performed At: [facility] [address] [place], [place] [id number] Lindon Romp MD OZ:[id number]   DAT, polyspecific, AHG ([place] only)     Status: None   Collection Time: 11/10/15 12:27 PM  Result Value  Ref Range   Polyspecific AHG test NEG   CBC with Differential     Status: Abnormal   Collection Time: 11/17/15 11:00 AM  Result Value Ref Range   WBC 7.4 3.6 - 11.0 K/uL   RBC 3.41 (L) 3.80 - 5.20 MIL/uL   Hemoglobin 9.8 (L) 12.0 - 16.0 g/dL    Comment: RESULT REPEATED AND VERIFIED   HCT 32.3 (L) 35.0 - 47.0 %    Comment: RESULT REPEATED AND VERIFIED   MCV 94.8 80.0 - 100.0 fL   MCH 28.6 26.0 - 34.0 pg   MCHC 30.2 (L) 32.0 - 36.0 g/dL   RDW 15.6 (H) 11.5 - 14.5 %   Platelets 364 150 - 440 K/uL   Neutrophils Relative % 77% %   Neutro Abs 5.7 1.4 - 6.5 K/uL   Lymphocytes Relative 14% %   Lymphs Abs 1.0 1.0 - 3.6 K/uL   Monocytes Relative 7% %   Monocytes Absolute 0.5 0.2 - 0.9 K/uL    Eosinophils Relative 1% %   Eosinophils Absolute 0.1 0 - 0.7 K/uL   Basophils Relative 1% %   Basophils Absolute 0.1 0 - 0.1 K/uL  CBC with Differential/Platelet     Status: None   Collection Time: 11/30/15  2:42 PM  Result Value Ref Range   WBC 8.5 4.0 - 10.5 K/uL   RBC 4.49 3.87 - 5.11 Mil/uL   Hemoglobin 12.8 12.0 - 15.0 g/dL   HCT 42.0 36.0 - 46.0 %   MCV 93.6 78.0 - 100.0 fl   MCHC 30.4 30.0 - 36.0 g/dL   RDW 14.8 11.5 - 15.5 %   Platelets 385.0 150.0 - 400.0 K/uL   Neutrophils Relative % 74.9 43.0 - 77.0 %   Lymphocytes Relative 18.4 12.0 - 46.0 %   Monocytes Relative 5.1 3.0 - 12.0 %   Eosinophils Relative 0.8 0.0 - 5.0 %   Basophils Relative 0.8 0.0 - 3.0 %   Neutro Abs 6.3 1.4 - 7.7 K/uL   Lymphs Abs 1.6 0.7 - 4.0 K/uL   Monocytes Absolute 0.4 0.1 - 1.0 K/uL   Eosinophils Absolute 0.1 0.0 - 0.7 K/uL   Basophils Absolute 0.1 0.0 - 0.1 K/uL      STUDIES: No results found.  ASSESSMENT: Anemia-likely multifactorial. Elevated MCV is inconsistent with iron deficiency anemia, but recent ferritin is fairly low, although still within normal range. The patient denies any history of bleeding, both recent and remote. She follows a regular diet, not a vegetarian, denies diarrhea which would lead to malabsorption. She also denied jaundice. It is somewhat difficult to clearly pinpoint the reason for new anemia. We will check vitamin B12 and folate levels, LDH and Coombs test along with haptoglobin to rule out hemolytic anemia, and will check reticulocyte count and review peripheral smear and renal function. I suspect that Melanie Bird indeed had a blood loss/destruction, which could be supported by the fact that her platelet count was elevated (it is lower today). She is fairly stable clinically, so she does not require any blood transfusions at this point.  She'll return to our clinic in 1 week to discuss the results of the workup.   Patient expressed understanding and was in agreement  with this plan. She also understands that She can call clinic at any time with any questions, concerns, or complaints.    No matching staging information was found for the patient.  Roxana Hires, MD   01/08/2016 1:13 PM  [place] @ [facility] Telephone:(336) [phone]  Fax:(336) [place]: 1941-08-24  MR#: [id number]  OIZ#:[id number]  Patient Care Team: Lucille Passy, MD as PCP - General Dallas Schimke, MD as Consulting Physician (Internal Medicine)  CHIEF COMPLAINT:  No chief complaint on file.  fatigue, anemia.   No history exists.   Melanie Bird has been followed by Dr. Inez Pilgrim in the past for alternating anemia and erythrocytosis. Patient was on iron supplementation for an extended period of time, but couldn't tolerate it, so was tapered off. Unfortunately, patient is a poor historian and obtaining exact timeline events is very difficult. Previous workup showed no evidence of primary hematologic malignancy or other medical conditions which would explain minimally elevated hemoglobin. No flowsheet data found.  INTERVAL HISTORY:  Since last appointment Melanie Bird has been feeling reasonably well, still complaining of fatigue. She's been able to tolerate her once a day iron supplementation fairly well . She does not endorse any obvious bleeding, jaundice, surgical operation or injury. She feels short of breath while performing regular activities of daily living. She denies chest pain, dizziness, weakness in arms or legs, diarrhea, easy bruising.   REVIEW OF SYSTEMS:   As per HPI. Otherwise, a complete review of systems is negatve.  PAST MEDICAL HISTORY: Past Medical History  Diagnosis Date  . Anemia   . Hypertension     PAST SURGICAL HISTORY: No past surgical history on file.  FAMILY HISTORY Family History  Problem Relation Age of Onset  . Diabetes Father   . Hypertension Sister     ADVANCED DIRECTIVES:  No flowsheet data found.  HEALTH  MAINTENANCE: Social History  Substance Use Topics  . Smoking status: Never Smoker   . Smokeless tobacco: Never Used  . Alcohol Use: No      No Known Allergies  Current Outpatient Prescriptions  Medication Sig Dispense Refill  . FeFum-FePo-FA-B Cmp-C-Zn-Mn-Cu (SE-TAN PLUS) 162-115.2-1 MG CAPS Take 1 capsule by mouth daily. 30 capsule 11  . metoprolol tartrate (LOPRESSOR) 25 MG tablet TAKE 1 TABLET (25 MG TOTAL) BY MOUTH 2 (TWO) TIMES DAILY. 60 tablet 11  . Multiple Vitamin (MULTIVITAMIN) tablet Take 1 tablet by mouth daily.    Marland Kitchen omeprazole (PRILOSEC) 20 MG capsule Take 1 capsule (20 mg total) by mouth daily. 30 capsule 6  . senna-docusate (SENNA S) 8.6-50 MG per tablet Take 1 tablet by mouth 2 (two) times daily. (Patient taking differently: Take 1 tablet by mouth at bedtime as needed. ) 60 tablet 2  . Vitamin D, Ergocalciferol, (DRISDOL) 50000 UNITS CAPS capsule TAKE 1 CAPSULE (50,000 UNITS TOTAL) BY MOUTH EVERY 7 (SEVEN) DAYS. 6 capsule 0   No current facility-administered medications for this visit.    OBJECTIVE:  There were no vitals filed for this visit.   There is no weight on file to calculate BMI.    ECOG FS:1 - Symptomatic but completely ambulatory There were no vitals filed for this visit.  PHYSICAL EXAM: There were no vitals taken for this visit.  General Appearance:    Alert, cooperative, no distress, appears stated age morbidly obese Caucasian female   Head:    Normocephalic, without obvious abnormality, atraumatic  Eyes:    PERRL, conjunctiva/corneas clear, EOM's intact, fundi    benign, both eyes  Ears:    Normal TM's and external ear canals, both ears  Nose:   Nares normal, septum midline, mucosa normal, no drainage    or sinus tenderness  Throat:   Lips, mucosa, and tongue normal; teeth and gums normal  Neck:   Supple, symmetrical, trachea midline, no adenopathy;    thyroid:  no enlargement/tenderness/nodules; no carotid   bruit or JVD  Back:     Symmetric, no  curvature, ROM normal, no CVA tenderness  Lungs:     Clear to auscultation bilaterally, respirations unlabored  Chest Wall:    No tenderness or deformity   Heart:    Regular rate and rhythm, S1 and S2 normal, no murmur, rub   or gallop  Breast Exam:    deferred by the patient   Abdomen:     Soft, non-tender, bowel sounds active all four quadrants,    no masses, no organomegaly. Physical exam is difficult due to patient's body habitus   Genitalia:   deferred by patient   Rectal:   deferred by patient   Extremities:   Extremities normal, atraumatic, no cyanosis or edema  Pulses:   2+ and symmetric all extremities  Skin:   Skin color, texture, turgor normal, no rashes or lesions  Lymph nodes:   Cervical, supraclavicular, and axillary nodes normal  Neurologic:   CNII-XII intact, normal strength, sensation and reflexes    throughout      LAB RESULTS:  Recent Results (from the past 2160 hour(s))  CBC with Differential/Platelet     Status: Abnormal   Collection Time: 10/26/15  2:55 PM  Result Value Ref Range   WBC 13.6 Repeated and verified X2. (H) 4.0 - 10.5 K/uL   RBC 2.93 (L) 3.87 - 5.11 Mil/uL   Hemoglobin 8.4 (L) 12.0 - 15.0 g/dL   HCT 27.6 (L) 36.0 - 46.0 %   MCV 94.4 78.0 - 100.0 fl   MCHC 30.5 30.0 - 36.0 g/dL   RDW 17.1 (H) 11.5 - 15.5 %   Platelets 548.0 (H) 150.0 - 400.0 K/uL   Neutrophils Relative % 86.7 Repeated and verified X2. (H) 43.0 - 77.0 %   Lymphocytes Relative 8.6 (L) 12.0 - 46.0 %   Monocytes Relative 4.1 3.0 - 12.0 %   Eosinophils Relative 0.0 0.0 - 5.0 %   Basophils Relative 0.6 0.0 - 3.0 %   Neutro Abs 11.8 (H) 1.4 - 7.7 K/uL   Lymphs Abs 1.2 0.7 - 4.0 K/uL   Monocytes Absolute 0.6 0.1 - 1.0 K/uL   Eosinophils Absolute 0.0 0.0 - 0.7 K/uL   Basophils Absolute 0.1 0.0 - 0.1 K/uL  Ferritin     Status: None   Collection Time: 10/26/15  2:55 PM  Result Value Ref Range   Ferritin 17.8 10.0 - 291.0 ng/mL  H. pylori antibody, IgG     Status: None    Collection Time: 10/26/15  2:55 PM  Result Value Ref Range   H Pylori IgG Negative Negative  CBC with Differential     Status: Abnormal   Collection Time: 11/10/15 11:39 AM  Result Value Ref Range   WBC 8.8 3.6 - 11.0 K/uL   RBC 3.09 (L) 3.80 - 5.20 MIL/uL   Hemoglobin 8.9 (L) 12.0 - 16.0 g/dL    Comment: RESULT REPEATED AND VERIFIED   HCT 30.1 (L) 35.0 - 47.0 %    Comment: RESULT REPEATED AND VERIFIED   MCV 97.7 80.0 - 100.0 fL   MCH 28.9 26.0 - 34.0 pg   MCHC 29.6 (L) 32.0 - 36.0 g/dL   RDW 16.5 (H) 11.5 - 14.5 %   Platelets 495 (H) 150 - 440 K/uL  Neutrophils Relative % 77% %   Neutro Abs 6.9 (H) 1.4 - 6.5 K/uL   Lymphocytes Relative 15% %   Lymphs Abs 1.3 1.0 - 3.6 K/uL   Monocytes Relative 6% %   Monocytes Absolute 0.5 0.2 - 0.9 K/uL   Eosinophils Relative 1% %   Eosinophils Absolute 0.1 0 - 0.7 K/uL   Basophils Relative 1% %   Basophils Absolute 0.1 0 - 0.1 K/uL  Save smear     Status: None   Collection Time: 11/10/15 11:39 AM  Result Value Ref Range   Smear Review SMEAR STAINED AND AVAILABLE FOR REVIEW   Comprehensive metabolic panel     Status: Abnormal   Collection Time: 11/10/15 12:27 PM  Result Value Ref Range   Sodium 143 135 - 145 mmol/L   Potassium 4.3 3.5 - 5.1 mmol/L   Chloride 113 (H) 101 - 111 mmol/L   CO2 24 22 - 32 mmol/L   Glucose, Bld 158 (H) 65 - 99 mg/dL   BUN 11 6 - 20 mg/dL   Creatinine, Ser 0.74 0.44 - 1.00 mg/dL   Calcium 9.5 8.9 - 10.3 mg/dL   Total Protein 5.9 (L) 6.5 - 8.1 g/dL   Albumin 3.2 (L) 3.5 - 5.0 g/dL   AST 18 15 - 41 U/L   ALT 12 (L) 14 - 54 U/L   Alkaline Phosphatase 71 38 - 126 U/L   Total Bilirubin 0.2 (L) 0.3 - 1.2 mg/dL   GFR calc non Af Amer >60 >60 mL/min   GFR calc Af Amer >60 >60 mL/min    Comment: (NOTE) The eGFR has been calculated using the CKD EPI equation. This calculation has not been validated in all clinical situations. eGFR's persistently <60 mL/min signify possible Chronic Kidney Disease.    Anion gap  6 5 - 15  Lactate dehydrogenase     Status: None   Collection Time: 11/10/15 12:27 PM  Result Value Ref Range   LDH 130 98 - 192 U/L  Haptoglobin     Status: None   Collection Time: 11/10/15 12:27 PM  Result Value Ref Range   Haptoglobin 178 34 - 200 mg/dL    Comment: (NOTE) Performed At: [facility] [place], [place] [id number] Lindon Romp MD NW:[id number]   Reticulocytes     Status: Abnormal   Collection Time: 11/10/15 12:27 PM  Result Value Ref Range   Retic Ct Pct 6.9 (H) 0.4 - 3.1 %   RBC. 3.14 (L) 3.80 - 5.20 MIL/uL   Retic Count, Manual 216.7 (H) 19.0 - 183.0 K/uL  Vitamin B12     Status: None   Collection Time: 11/10/15 12:27 PM  Result Value Ref Range   Vitamin B-12 881 180 - 914 pg/mL    Comment: (NOTE) This assay is not validated for testing neonatal or myeloproliferative syndrome specimens for Vitamin B12 levels. Performed at [facility]   Folate RBC     Status: Abnormal   Collection Time: 11/10/15 12:27 PM  Result Value Ref Range   Folate, Hemolysate >620.0 Not Estab. ng/mL   Hematocrit 29.1 (L) 34.0 - 46.6 %   Folate, RBC >2131 >498 ng/mL    Comment: (NOTE) Performed At: [facility] [address] [place], [place] [id number] Lindon Romp MD BM:[id number]   DAT, polyspecific, AHG ([place] only)     Status: None   Collection Time: 11/10/15 12:27 PM  Result Value Ref Range   Polyspecific AHG test NEG  CBC with Differential     Status: Abnormal   Collection Time: 11/17/15 11:00 AM  Result Value Ref Range   WBC 7.4 3.6 - 11.0 K/uL   RBC 3.41 (L) 3.80 - 5.20 MIL/uL   Hemoglobin 9.8 (L) 12.0 - 16.0 g/dL    Comment: RESULT REPEATED AND VERIFIED   HCT 32.3 (L) 35.0 - 47.0 %    Comment: RESULT REPEATED AND VERIFIED   MCV 94.8 80.0 - 100.0 fL   MCH 28.6 26.0 - 34.0 pg   MCHC 30.2 (L) 32.0 - 36.0 g/dL   RDW 15.6 (H) 11.5 - 14.5 %   Platelets 364 150 - 440 K/uL   Neutrophils Relative % 77% %   Neutro Abs 5.7  1.4 - 6.5 K/uL   Lymphocytes Relative 14% %   Lymphs Abs 1.0 1.0 - 3.6 K/uL   Monocytes Relative 7% %   Monocytes Absolute 0.5 0.2 - 0.9 K/uL   Eosinophils Relative 1% %   Eosinophils Absolute 0.1 0 - 0.7 K/uL   Basophils Relative 1% %   Basophils Absolute 0.1 0 - 0.1 K/uL  CBC with Differential/Platelet     Status: None   Collection Time: 11/30/15  2:42 PM  Result Value Ref Range   WBC 8.5 4.0 - 10.5 K/uL   RBC 4.49 3.87 - 5.11 Mil/uL   Hemoglobin 12.8 12.0 - 15.0 g/dL   HCT 42.0 36.0 - 46.0 %   MCV 93.6 78.0 - 100.0 fl   MCHC 30.4 30.0 - 36.0 g/dL   RDW 14.8 11.5 - 15.5 %   Platelets 385.0 150.0 - 400.0 K/uL   Neutrophils Relative % 74.9 43.0 - 77.0 %   Lymphocytes Relative 18.4 12.0 - 46.0 %   Monocytes Relative 5.1 3.0 - 12.0 %   Eosinophils Relative 0.8 0.0 - 5.0 %   Basophils Relative 0.8 0.0 - 3.0 %   Neutro Abs 6.3 1.4 - 7.7 K/uL   Lymphs Abs 1.6 0.7 - 4.0 K/uL   Monocytes Absolute 0.4 0.1 - 1.0 K/uL   Eosinophils Absolute 0.1 0.0 - 0.7 K/uL   Basophils Absolute 0.1 0.0 - 0.1 K/uL   I personally reviewed peripheral blood smear, which showed mild anemia with normal morphology of all 3 lineages.    STUDIES: No results found.  ASSESSMENT: Anemia-the exact reason for anemia remains to be elusive, since she has no evidence of vitamin deficiency, hemolysis, or hematologic malignancy. Relatively rapid development of anemia with increased platelet count and white blood cells would suggest an acute process, however, no clear source of bleeding has been identified. Her hemoglobin has improved dramatically without any additional interventions. She should continue with iron supplementation. She will return to our clinic in 3 months. Fatigue-unlikely to be related to anemia or other hematologic disorder. Patient snores loudly; we discussed the possibility of referring her for sleep study. She will discuss this with her family members.    Patient expressed understanding and was  in agreement with this plan. She also understands that She can call clinic at any time with any questions, concerns, or complaints.    No matching staging information was found for the patient.  Roxana Hires, MD   01/08/2016 1:13 PM

## 2016-01-08 NOTE — Progress Notes (Signed)
Pt states she is tired and not sure if it is worse or not from back in nov.  She has to rest in between chores at home.  She gets weak and feels like if she eats a little bit she will get more energy but it does not work. No blood in stool or urine

## 2016-01-19 ENCOUNTER — Other Ambulatory Visit: Payer: Self-pay

## 2016-01-19 MED ORDER — VITAMIN D (ERGOCALCIFEROL) 1.25 MG (50000 UNIT) PO CAPS: ORAL_CAPSULE | ORAL | Status: DC

## 2016-01-19 NOTE — Telephone Encounter (Signed)
Ok to refill but please schedule Vit D lab.

## 2016-01-19 NOTE — Telephone Encounter (Signed)
Spoke to pt and scheduled f/u lab

## 2016-01-19 NOTE — Telephone Encounter (Signed)
Pt request refill Vit D 50,000 units to [facility]; last refilled # 6 x 0 on 11/25/15; Do you want pt to continue Vit D 50,000? Last Vit D level 10/08/14 from lab listing. pt last seen 10/26/15. Pt request cb when refilled.

## 2016-01-22 ENCOUNTER — Other Ambulatory Visit: Payer: Self-pay | Admitting: Family Medicine

## 2016-02-01 ENCOUNTER — Other Ambulatory Visit (INDEPENDENT_AMBULATORY_CARE_PROVIDER_SITE_OTHER): Payer: Medicare Other

## 2016-02-01 DIAGNOSIS — E559 Vitamin D deficiency, unspecified: Secondary | ICD-10-CM | POA: Diagnosis not present

## 2016-02-01 LAB — VITAMIN D 25 HYDROXY (VIT D DEFICIENCY, FRACTURES): VITD: 29.63 ng/mL — AB (ref 30.00–100.00)

## 2016-02-26 ENCOUNTER — Telehealth: Payer: Self-pay | Admitting: *Deleted

## 2016-02-26 NOTE — Telephone Encounter (Signed)
Pt contacted office and is questioning if she should continue se-tan plus. Medication was d/c from med list 01/17 and indicates "change in therapy" and pt is now prescribed ferrous sulfate 325mg  and is requesting a call back to confirm which she should continue. If pt is needing se-tan plus, a new Rx is needed to pharmacy on file

## 2016-04-07 NOTE — Telephone Encounter (Signed)
Spoke to pt and advised per Dr Aron.  

## 2016-04-07 NOTE — Telephone Encounter (Signed)
Spoke to pt, who thought she had seen Dr Deborra Medina a month ago. Advised her she has not been seen since 10/2015;appt scheduled.

## 2016-04-07 NOTE — Telephone Encounter (Signed)
Ferrous sulfate is well absorbed.  What is she currently taking?

## 2016-04-07 NOTE — Telephone Encounter (Signed)
Noted.  Let's plan on recheck a CBC in the next few weeks.

## 2016-04-07 NOTE — Telephone Encounter (Signed)
Pt left v/m; pt did not hear response from call in 02/2016 about which med to take, se-tan plus or ferrous sulfate; pt wants med to boast her energy level. Pt last seen 10/26/15 for fatigue. Pt request cb. No future appt scheduled.[facility].

## 2016-04-07 NOTE — Telephone Encounter (Signed)
Pt left v/m requesting cb; pt wants to know if med was sent to the pharmacy for pt to have more energy.

## 2016-04-07 NOTE — Telephone Encounter (Signed)
Spoke to pt and advised per Dr Deborra Medina; states she takes 325mg  daily

## 2016-04-08 ENCOUNTER — Other Ambulatory Visit: Payer: Medicare Other

## 2016-04-08 ENCOUNTER — Ambulatory Visit: Payer: Medicare Other

## 2016-04-11 ENCOUNTER — Ambulatory Visit (INDEPENDENT_AMBULATORY_CARE_PROVIDER_SITE_OTHER)
Admission: RE | Admit: 2016-04-11 | Discharge: 2016-04-11 | Disposition: A | Discharge status: Home / Self Care | Payer: Medicare Other | Source: Ambulatory Visit | Attending: Family Medicine | Admitting: Family Medicine

## 2016-04-11 ENCOUNTER — Encounter: Payer: Self-pay | Admitting: Family Medicine

## 2016-04-11 ENCOUNTER — Ambulatory Visit (INDEPENDENT_AMBULATORY_CARE_PROVIDER_SITE_OTHER): Payer: Medicare Other | Admitting: Family Medicine

## 2016-04-11 ENCOUNTER — Inpatient Hospital Stay (HOSPITAL_BASED_OUTPATIENT_CLINIC_OR_DEPARTMENT_OTHER): Payer: Medicare Other | Admitting: Family Medicine

## 2016-04-11 ENCOUNTER — Inpatient Hospital Stay: Payer: Medicare Other | Attending: Family Medicine

## 2016-04-11 VITALS — BP 165/93 | HR 78 | Temp 97.1°F | Resp 18 | Wt 190.7 lb

## 2016-04-11 VITALS — BP 140/98 | HR 109 | Temp 97.5°F | Wt 193.8 lb

## 2016-04-11 DIAGNOSIS — R05 Cough: Secondary | ICD-10-CM | POA: Diagnosis not present

## 2016-04-11 DIAGNOSIS — R531 Weakness: Secondary | ICD-10-CM | POA: Diagnosis not present

## 2016-04-11 DIAGNOSIS — R0683 Snoring: Secondary | ICD-10-CM | POA: Insufficient documentation

## 2016-04-11 DIAGNOSIS — D509 Iron deficiency anemia, unspecified: Secondary | ICD-10-CM | POA: Insufficient documentation

## 2016-04-11 DIAGNOSIS — D649 Anemia, unspecified: Secondary | ICD-10-CM

## 2016-04-11 DIAGNOSIS — K5900 Constipation, unspecified: Secondary | ICD-10-CM | POA: Insufficient documentation

## 2016-04-11 DIAGNOSIS — R0609 Other forms of dyspnea: Secondary | ICD-10-CM | POA: Diagnosis not present

## 2016-04-11 DIAGNOSIS — R5383 Other fatigue: Secondary | ICD-10-CM

## 2016-04-11 DIAGNOSIS — R0602 Shortness of breath: Secondary | ICD-10-CM

## 2016-04-11 DIAGNOSIS — Z79899 Other long term (current) drug therapy: Secondary | ICD-10-CM | POA: Insufficient documentation

## 2016-04-11 DIAGNOSIS — I10 Essential (primary) hypertension: Secondary | ICD-10-CM | POA: Diagnosis not present

## 2016-04-11 DIAGNOSIS — R059 Cough, unspecified: Secondary | ICD-10-CM | POA: Insufficient documentation

## 2016-04-11 DIAGNOSIS — K219 Gastro-esophageal reflux disease without esophagitis: Secondary | ICD-10-CM | POA: Insufficient documentation

## 2016-04-11 DIAGNOSIS — E559 Vitamin D deficiency, unspecified: Secondary | ICD-10-CM

## 2016-04-11 DIAGNOSIS — R5382 Chronic fatigue, unspecified: Secondary | ICD-10-CM | POA: Diagnosis not present

## 2016-04-11 LAB — CBC WITH DIFFERENTIAL/PLATELET
BASOS PCT: 1 %
Basophils Absolute: 0.1 10*3/uL (ref 0–0.1)
EOS ABS: 0 10*3/uL (ref 0–0.7)
EOS PCT: 1 %
HCT: 45.7 % (ref 35.0–47.0)
HEMOGLOBIN: 15.1 g/dL (ref 12.0–16.0)
Lymphocytes Relative: 15 %
Lymphs Abs: 0.9 10*3/uL — ABNORMAL LOW (ref 1.0–3.6)
MCH: 30.8 pg (ref 26.0–34.0)
MCHC: 33 g/dL (ref 32.0–36.0)
MCV: 93.2 fL (ref 80.0–100.0)
MONOS PCT: 7 %
Monocytes Absolute: 0.4 10*3/uL (ref 0.2–0.9)
NEUTROS PCT: 76 %
Neutro Abs: 4.7 10*3/uL (ref 1.4–6.5)
PLATELETS: 241 10*3/uL (ref 150–440)
RBC: 4.9 MIL/uL (ref 3.80–5.20)
RDW: 13.9 % (ref 11.5–14.5)
WBC: 6.1 10*3/uL (ref 3.6–11.0)

## 2016-04-11 LAB — FERRITIN: Ferritin: 111 ng/mL (ref 11–307)

## 2016-04-11 MED ORDER — HYDROCOD POLST-CPM POLST ER 10-8 MG/5ML PO SUER: 5.0000 mL | Freq: Two times a day (BID) | ORAL | Status: DC | PRN

## 2016-04-11 NOTE — Assessment & Plan Note (Signed)
Deteriorated. Likely multifactorial. Recheck Vit D today, refer to pulm for sleep study. Discussed weight loss as well.

## 2016-04-11 NOTE — Progress Notes (Signed)
Pre visit review using our clinic review tool, if applicable. No additional management support is needed unless otherwise documented below in the visit note. 

## 2016-04-11 NOTE — Progress Notes (Signed)
[place]  Telephone:([phone]) [phone]  Fax:([phone]) Dewar Lucking DOB: 03-Nov-1941  MR#: [id number]  [id number]  Patient Care Team: Lucille Passy, MD as PCP - General Dallas Schimke, MD as Consulting Physician (Internal Medicine)  CHIEF COMPLAINT:  Chief Complaint  Patient presents with  . Anemia  Mrs. Karcher has been followed by Dr. Inez Pilgrim in the past for alternating anemia and erythrocytosis. Patient was on iron supplementation for an extended period of time, but couldn't tolerate it, so was tapered off. Unfortunately, patient is a poor historian and obtaining exact timeline events is very difficult. Previous workup showed no evidence of primary hematologic malignancy or other medical conditions which would explain minimally elevated hemoglobin.  INTERVAL HISTORY:   She reports that she's had progressing shortness of breath, weakness/fatigue since her last visit. She believes that this is related to her anemia and her iron deficiency.  She continues to take oral iron as well as tandem plus multivitamin.  She is tolerating these medications well except for some intermittent constipation for which she takes senna S.  Daughter is at her side today. Discussed with patient her lab results.  Patient is also having a cough with productive sputum for which she has an appointment with her PCP this afternoon. She also reports not sleeping well at night due to increased urination, she is getting up 3-4 times per night to urinate. She also reports frequent napping during the day which is related to her fatigue.  REVIEW OF SYSTEMS:   Review of Systems  Constitutional: Positive for malaise/fatigue. Negative for fever, chills, weight loss and diaphoresis.  HENT: Positive for congestion.   Eyes: Negative.   Respiratory: Positive for cough, sputum production and shortness of breath. Negative for hemoptysis and wheezing.   Cardiovascular: Negative for chest pain, palpitations,  orthopnea, claudication, leg swelling and PND.  Gastrointestinal: Negative for heartburn, nausea, vomiting, abdominal pain, diarrhea, constipation, blood in stool and melena.  Genitourinary:       Nocturia  Musculoskeletal: Negative.   Skin: Negative.   Neurological: Negative for dizziness, tingling, focal weakness, seizures and weakness.  Endo/Heme/Allergies: Does not bruise/bleed easily.  Psychiatric/Behavioral: Positive for depression. The patient has insomnia. The patient is not nervous/anxious.     As per HPI. Otherwise, a complete review of systems is negatve.   PAST MEDICAL HISTORY: Past Medical History  Diagnosis Date  . Anemia   . Hypertension   . GERD (gastroesophageal reflux disease)     PAST SURGICAL HISTORY: No past surgical history on file.  FAMILY HISTORY Family History  Problem Relation Age of Onset  . Diabetes Father   . Hypertension Sister     GYNECOLOGIC HISTORY:  No LMP recorded. Patient is postmenopausal.     ADVANCED DIRECTIVES:    HEALTH MAINTENANCE: Social History  Substance Use Topics  . Smoking status: Never Smoker   . Smokeless tobacco: Never Used  . Alcohol Use: No      No Known Allergies  Current Outpatient Prescriptions  Medication Sig Dispense Refill  . FeFum-FePo-FA-B Cmp-C-Zn-Mn-Cu (SE-TAN PLUS) 162-115.2-1 MG CAPS Take 1 capsule by mouth daily.  11  . metoprolol tartrate (LOPRESSOR) 25 MG tablet TAKE 1 TABLET (25 MG TOTAL) BY MOUTH 2 (TWO) TIMES DAILY. 60 tablet 11  . Multiple Vitamin (MULTIVITAMIN) tablet Take 1 tablet by mouth daily.    Marland Kitchen omeprazole (PRILOSEC) 20 MG capsule Take 1 capsule (20 mg total) by mouth daily. 30 capsule 6  .  senna-docusate (SENNA S) 8.6-50 MG per tablet Take 1 tablet by mouth 2 (two) times daily. (Patient taking differently: Take 1 tablet by mouth at bedtime as needed. ) 60 tablet 2  . Vitamin D, Ergocalciferol, (DRISDOL) 50000 units CAPS capsule TAKE 1 CAPSULE (50,000 UNITS TOTAL) BY MOUTH EVERY 7  (SEVEN) DAYS. 6 capsule 0   No current facility-administered medications for this visit.    OBJECTIVE: BP 165/93 mmHg  Pulse 78  Temp(Src) 97.1 F (36.2 C) (Tympanic)  Resp 18  Wt 190 lb 11.2 oz (86.5 kg)   Body mass index is 38.5 kg/(m^2).    ECOG FS:1 - Symptomatic but completely ambulatory  General: Well-developed, well-nourished, no acute distress. Eyes: Pink conjunctiva, anicteric sclera. HEENT: Normocephalic, moist mucous membranes, clear oropharnyx. Lungs:  Slight inspiratory wheeze but otherwise clear to auscultation bilaterally. Heart: Regular rate and rhythm. No rubs, murmurs, or gallops. Musculoskeletal: No edema, cyanosis, or clubbing. Neuro: Alert, answering all questions appropriately. Cranial nerves grossly intact. Skin: No rashes or petechiae noted. Psych: Normal affect,  Slightly anxious   LAB RESULTS:  Appointment on 04/11/2016  Component Date Value Ref Range Status  . WBC 04/11/2016 6.1  3.6 - 11.0 K/uL Final  . RBC 04/11/2016 4.90  3.80 - 5.20 MIL/uL Final  . Hemoglobin 04/11/2016 15.1  12.0 - 16.0 g/dL Final  . HCT 04/11/2016 45.7  35.0 - 47.0 % Final  . MCV 04/11/2016 93.2  80.0 - 100.0 fL Final  . MCH 04/11/2016 30.8  26.0 - 34.0 pg Final  . MCHC 04/11/2016 33.0  32.0 - 36.0 g/dL Final  . RDW 04/11/2016 13.9  11.5 - 14.5 % Final  . Platelets 04/11/2016 241  150 - 440 K/uL Final  . Neutrophils Relative % 04/11/2016 76   Final  . Neutro Abs 04/11/2016 4.7  1.4 - 6.5 K/uL Final  . Lymphocytes Relative 04/11/2016 15   Final  . Lymphs Abs 04/11/2016 0.9* 1.0 - 3.6 K/uL Final  . Monocytes Relative 04/11/2016 7   Final  . Monocytes Absolute 04/11/2016 0.4  0.2 - 0.9 K/uL Final  . Eosinophils Relative 04/11/2016 1   Final  . Eosinophils Absolute 04/11/2016 0.0  0 - 0.7 K/uL Final  . Basophils Relative 04/11/2016 1   Final  . Basophils Absolute 04/11/2016 0.1  0 - 0.1 K/uL Final    STUDIES: No results found.  ASSESSMENT:  Anemia.  Fatigue.  PLAN:     1. Anemia. Patient's previous workup revealed no evidence of vitamin deficiency, hematuria lysis, or hematologic malignancy. The relatively rapid development of anemia with increased platelet count and white blood cells suggested an acute process, however, there was no clear source of bleeding that could be identified. Her hemoglobin has improved dramatically as she is currently on oral iron. Hemoglobin 15.1, hematocrit 45.7, WBC 6.1, platelets 241,  Ferritin 111.  2. Fatigue. Fatigue is not related to anemia however sources such as sleep apnea, obesity and lack of exercise cannot be excluded.  Advised patient that she should discuss this with her primary care doctor this afternoon for her visits regarding cough, shortness of breath, and sputum production. Discussed in depth the possibility of a sleep study to see if she has sleep apnea which may be causing some of her symptoms.   We'll continue with routine follow-up in approximately 3 months. Patient expressed understanding and was in agreement with this plan. She also understands that She can call clinic at any time with any questions, concerns, or complaints.  Dr. Oliva Bustard was available for consultation and review of plan of care for this patient.  Evlyn Kanner, NP   04/11/2016 1:37 PM

## 2016-04-11 NOTE — Patient Instructions (Signed)
Great to see you. Please stop by to see Melanie Bird on your way out.   

## 2016-04-11 NOTE — Progress Notes (Signed)
Pt complains of having productive cough with green-yellow sputum since last Thursday. States felt feverish on Thursday with hot flashes. States has been very weak lately with fatigue and no energy.

## 2016-04-11 NOTE — Progress Notes (Signed)
Subjective:   Patient ID: Melanie Bird, female    DOB: 1941-11-30, 75 y.o.   MRN: [id number]  Melanie Bird is a pleasant 75 y.o. year old female who presents to clinic today with her daughter for Bronchitis  on 04/11/2016  HPI:  Persistent fatigue for years but feels it is getting worse. Having more DOE.  Followed by hematology for anemia. Was last seen in fact, today,  by Georgeanne Nim, NP- note not yet completed but CBC wnl.  She denies chest pain. She is taking Ferrex and Tandem Plus daily as prescribed.   Appetite good.  I did refer her to cardiology in 11/2014 when she complained of anemia with DOE.  Saw Dr. Acie Fredrickson on 12/01/14.  Note again reviewed today. 2 D echo- normal LV.  Felt symptoms due to obesity and deconditioning.  Vit D was a little low in the low 20s last year but we did replete it at that time.  Past few days, increased cough and congestion.  No fever.  Cough is productive, keeping her up at night.  She does snore.    Wt Readings from Last 3 Encounters:  04/11/16 193 lb 12.8 oz (87.907 kg)  04/11/16 190 lb 11.2 oz (86.5 kg)  01/08/16 191 lb 2.2 oz (86.7 kg)   Lab Results  Component Value Date   WBC 6.1 04/11/2016   HGB 15.1 04/11/2016   HCT 45.7 04/11/2016   MCV 93.2 04/11/2016   PLT 241 04/11/2016   Lab Results  Component Value Date   [id number] 11/10/2015    Lab Results  Component Value Date   CHOL 234* 10/13/2010   HDL 67.00 10/13/2010   LDLDIRECT 139.3 10/13/2010   TRIG 201.0* 10/13/2010   CHOLHDL 3 10/13/2010   Current Outpatient Prescriptions on File Prior to Visit  Medication Sig Dispense Refill  . FeFum-FePo-FA-B Cmp-C-Zn-Mn-Cu (SE-TAN PLUS) 162-115.2-1 MG CAPS Take 1 capsule by mouth daily.  11  . metoprolol tartrate (LOPRESSOR) 25 MG tablet TAKE 1 TABLET (25 MG TOTAL) BY MOUTH 2 (TWO) TIMES DAILY. 60 tablet 11  . Multiple Vitamin (MULTIVITAMIN) tablet Take 1 tablet by mouth daily.    Marland Kitchen omeprazole (PRILOSEC) 20 MG  capsule Take 1 capsule (20 mg total) by mouth daily. 30 capsule 6  . senna-docusate (SENNA S) 8.6-50 MG per tablet Take 1 tablet by mouth 2 (two) times daily. (Patient taking differently: Take 1 tablet by mouth at bedtime as needed. ) 60 tablet 2  . Vitamin D, Ergocalciferol, (DRISDOL) 50000 units CAPS capsule TAKE 1 CAPSULE (50,000 UNITS TOTAL) BY MOUTH EVERY 7 (SEVEN) DAYS. 6 capsule 0   No current facility-administered medications on file prior to visit.    No Known Allergies  Past Medical History  Diagnosis Date  . Anemia   . Hypertension   . GERD (gastroesophageal reflux disease)     No past surgical history on file.  Family History  Problem Relation Age of Onset  . Diabetes Father   . Hypertension Sister     Social History   Social History  . Marital Status: Married    Spouse Name: N/A  . Number of Children: N/A  . Years of Education: N/A   Occupational History  . Not on file.   Social History Main Topics  . Smoking status: Never Smoker   . Smokeless tobacco: Never Used  . Alcohol Use: No  . Drug Use: No  . Sexual Activity: Not Currently   Other Topics Concern  .  Not on file   Social History Narrative   The PMH, PSH, Social History, Family History, Medications, and allergies have been reviewed in [facility], and have been updated if relevant.    Review of Systems  Constitutional: Positive for fatigue. Negative for fever.  HENT: Positive for congestion and rhinorrhea.   Respiratory: Positive for cough, shortness of breath and wheezing.   Cardiovascular: Negative.   Gastrointestinal: Negative.   Musculoskeletal: Negative.   Skin: Negative.   Neurological: Negative.   Hematological: Does not bruise/bleed easily.  All other systems reviewed and are negative.     Objective:    BP 140/98 mmHg  Pulse 109  Temp(Src) 97.5 F (36.4 C) (Oral)  Wt 193 lb 12.8 oz (87.907 kg)  SpO2 95%   Physical Exam  Constitutional: She appears well-developed and  well-nourished.  HENT:  Head: Normocephalic and atraumatic.  Eyes: Pupils are equal, round, and reactive to light.  Neck: Normal range of motion. Neck supple.  Cardiovascular: Normal rate and regular rhythm.   Pulmonary/Chest: Effort normal. She has wheezes.  Abdominal: Soft. Bowel sounds are normal.  Musculoskeletal: Normal range of motion.  Neurological: She is alert.  Skin: Skin is warm and dry.  Psychiatric: She has a normal mood and affect. Her behavior is normal. Judgment and thought content normal.  Nursing note and vitals reviewed.         Assessment & Plan:   Chronic fatigue - Plan: Ambulatory referral to Pulmonology  Snoring - Plan: Ambulatory referral to Pulmonology  Vitamin D deficiency - Plan: Vitamin D, 25-hydroxy  DOE (dyspnea on exertion) - Plan: DG Chest 2 View No Follow-up on file.

## 2016-04-11 NOTE — Assessment & Plan Note (Signed)
New- Faint wheezes on exam. ?viral bronchitis. CXR today to rule out PNA. Rx given for tussionex to use as needed for cough so she can get some rest.  Discussed sedation precautions.

## 2016-04-12 ENCOUNTER — Other Ambulatory Visit: Payer: Self-pay | Admitting: Family Medicine

## 2016-04-12 LAB — VITAMIN D 25 HYDROXY (VIT D DEFICIENCY, FRACTURES): VITD: 21.14 ng/mL — ABNORMAL LOW (ref 30.00–100.00)

## 2016-04-12 MED ORDER — LEVOFLOXACIN 500 MG PO TABS: 500.0000 mg | ORAL_TABLET | Freq: Every day | ORAL | Status: DC

## 2016-04-13 ENCOUNTER — Ambulatory Visit: Payer: Medicare Other | Admitting: Internal Medicine

## 2016-04-15 MED ORDER — VITAMIN D (ERGOCALCIFEROL) 1.25 MG (50000 UNIT) PO CAPS: ORAL_CAPSULE | ORAL | Status: DC

## 2016-04-15 NOTE — Addendum Note (Signed)
Addended by: Modena Nunnery on: 04/15/2016 10:46 AM   Modules accepted: Orders

## 2016-04-18 ENCOUNTER — Telehealth: Payer: Self-pay

## 2016-04-18 NOTE — Telephone Encounter (Signed)
Levaquin is very strong and we should not send in another rx without seeing her.  Please schedule OV for re evaluation.

## 2016-04-18 NOTE — Telephone Encounter (Signed)
Notified pt that she needed to seen in order to prescribe an abx, she said she will give it a few days and see how she feels. She knows to call our office for an appt.

## 2016-04-18 NOTE — Telephone Encounter (Signed)
Pt left v/m; pt was seen 04/14/16; pt was running fever this morning; pt request abx to [facility]. Pt is presently taking levaquin but does not want to run out of abx. Pt thinks has one more pill to take? I spoke with pt and pts daughter took temp and pt not sure what her temp was. pts daughter is at work. Pt is still tired and has prod cough with yellow phlegm; pts cough is not as bad as when seen; pt continues taking cough med. Pt request cb.

## 2016-05-03 ENCOUNTER — Ambulatory Visit (INDEPENDENT_AMBULATORY_CARE_PROVIDER_SITE_OTHER): Payer: Medicare Other | Admitting: Internal Medicine

## 2016-05-03 ENCOUNTER — Encounter: Payer: Self-pay | Admitting: Internal Medicine

## 2016-05-03 VITALS — BP 136/76 | HR 94 | Ht 59.0 in | Wt 185.0 lb

## 2016-05-03 DIAGNOSIS — G4719 Other hypersomnia: Secondary | ICD-10-CM | POA: Diagnosis not present

## 2016-05-03 NOTE — Patient Instructions (Addendum)
Will send for sleep study.   Continue omeprazole, avoid eating, especially fatty foods within 4 hours of bedtime.   Sleep Apnea Sleep apnea is disorder that affects a person's sleep. A person with sleep apnea has abnormal pauses in their breathing when they sleep. It is hard for them to get a good sleep. This makes a person tired during the day. It also can lead to other physical problems. There are three types of sleep apnea. One type is when breathing stops for a short time because your airway is blocked (obstructive sleep apnea). Another type is when the brain sometimes fails to give the normal signal to breathe to the muscles that control your breathing (central sleep apnea). The third type is a combination of the other two types. HOME CARE   Take all medicine as told by your doctor.  Avoid alcohol, calming medicines (sedatives), and depressant drugs.  Try to lose weight if you are overweight. Talk to your doctor about a healthy weight goal.  Your doctor may have you use a device that helps to open your airway. It can help you get the air that you need. It is called a positive airway pressure (PAP) device.   MAKE SURE YOU:   Understand these instructions.  Will watch your condition.  Will get help right away if you are not doing well or get worse.  It may take approximately 1 month for you to get used to wearing her CPAP every night.

## 2016-05-03 NOTE — Addendum Note (Signed)
Addended by: Maryanna Shape A on: 05/03/2016 10:51 AM   Modules accepted: Orders

## 2016-05-03 NOTE — Progress Notes (Signed)
[facility] Consultation      Assessment and Plan:  Excessive daytime sleepiness. --Symptoms and signs consistent with OSA.   Cough. Due to recent URI, doing better.   Hiatal hernia/intrathoracic stomach. --With gerd symptoms, continue omeprazole.   Chronic rhinitis.  --Can use flonase if becomes problematic.   Date: 05/03/2016  MRN# [id number] Melanie Bird Feb 25, 1941  Referring Physician: Dr. Tanja Port.   Melanie Bird is a 75 y.o. old female seen in consultation for chief complaint of:    Chief Complaint  Patient presents with  . sleep consult    pt ref by dr. Deborra Medina. pt c/o daytime sleepiness, occ trouble falling alseep, loud snoring. EPWORTH:12    HPI:   The patient is a 75 yo female referred due to complaints of snoring and fatigue. She was recently seen by her PCP for complaints of cough, she was given script of tussionex to be used prn.  She is getting over the cough, but it is much improved. She is present with her daughter today and provides much of the history.  It has been noted that she falls asleep throughout the day, particularly when watching television. This has been going on for several years. It has been progressing. She snores at night, uncertain of witness apneas.   She takes omeprazole daily she may have occasional symptoms especially when she lays down, but this is rare.   CXR images review from 04/11/16 showed obesity and large hiatal hernia/intrathoracic stomach which did not look significantly different from previous film from 08/01/14.    PMHX:   Past Medical History  Diagnosis Date  . Anemia   . Hypertension   . GERD (gastroesophageal reflux disease)    Surgical Hx:  No past surgical history on file. Family Hx:  Family History  Problem Relation Age of Onset  . Diabetes Father   . Hypertension Sister    Social Hx:   Social History  Substance Use Topics  . Smoking status: Never Smoker   . Smokeless tobacco: Never Used    . Alcohol Use: No   Medication:   Current Outpatient Rx  Name  Route  Sig  Dispense  Refill  . chlorpheniramine-HYDROcodone (TUSSIONEX PENNKINETIC ER) 10-8 MG/5ML SUER   Oral   Take 5 mLs by mouth every 12 (twelve) hours as needed.   140 mL   0   . FeFum-FePo-FA-B Cmp-C-Zn-Mn-Cu (SE-TAN PLUS) 162-115.2-1 MG CAPS   Oral   Take 1 capsule by mouth daily.      11   . levofloxacin (LEVAQUIN) 500 MG tablet   Oral   Take 1 tablet (500 mg total) by mouth daily.   7 tablet   0   . metoprolol tartrate (LOPRESSOR) 25 MG tablet      TAKE 1 TABLET (25 MG TOTAL) BY MOUTH 2 (TWO) TIMES DAILY.   60 tablet   11   . Multiple Vitamin (MULTIVITAMIN) tablet   Oral   Take 1 tablet by mouth daily.         Marland Kitchen omeprazole (PRILOSEC) 20 MG capsule   Oral   Take 1 capsule (20 mg total) by mouth daily.   30 capsule   6   . senna-docusate (SENNA S) 8.6-50 MG per tablet   Oral   Take 1 tablet by mouth 2 (two) times daily. Patient taking differently: Take 1 tablet by mouth at bedtime as needed.    60 tablet   2   . Vitamin D, Ergocalciferol, (  DRISDOL) 50000 units CAPS capsule      TAKE 1 CAPSULE (50,000 UNITS TOTAL) BY MOUTH EVERY 7 (SEVEN) DAYS.   6 capsule   0       Allergies:  Review of patient's allergies indicates no known allergies.  Review of Systems: Gen:  Denies  fever, sweats, chills HEENT: Denies blurred vision, double vision. bleeds, Cvc:  No dizziness, chest pain. Resp:   Denies cough or sputum production, shortness of breath Gi: Denies swallowing difficulty, stomach pain. Gu:  Denies bladder incontinence, burning urine Ext:   No Joint pain, stiffness. Skin: No skin rash,  hives  Endoc:  No polyuria, polydipsia. Psych: No depression, insomnia. Other:  All other systems were reviewed with the patient and were negative other that what is mentioned in the HPI.   Physical Examination:   VS: BP 136/76 mmHg  Pulse 94  Ht 4\' 11"  (1.499 m)  Wt 185 lb (83.915 kg)   BMI 37.35 kg/m2  SpO2 94%  General Appearance: No distress  Neuro:without focal findings,  speech normal,  HEENT: PERRLA, EOM intact.  [place] 3.  Pulmonary: normal breath sounds, No wheezing.  CardiovascularNormal S1,S2.  No m/r/g.   Abdomen: Benign, Soft, non-tender. Renal:  No costovertebral tenderness  GU:  No performed at this time. Endoc: No evident thyromegaly, no signs of acromegaly. Skin:   warm, no rashes, no ecchymosis  Extremities: normal, no cyanosis, clubbing.  Other findings:    LABORATORY PANEL:   CBC No results for input(s): WBC, HGB, HCT, PLT in the last 168 hours. ------------------------------------------------------------------------------------------------------------------  Chemistries  No results for input(s): NA, K, CL, CO2, GLUCOSE, BUN, CREATININE, CALCIUM, MG, AST, ALT, ALKPHOS, BILITOT in the last 168 hours.  Invalid input(s): GFRCGP ------------------------------------------------------------------------------------------------------------------  Cardiac Enzymes No results for input(s): TROPONINI in the last 168 hours. ------------------------------------------------------------  RADIOLOGY:  No results found.     Thank  you for the consultation and for allowing [facility] to assist in the care of your patient. Our recommendations are noted above.  Please contact us if we can be of further service.   Marda Stalker, MD.  Board Certified in Internal Medicine, Pulmonary Medicine, [place], and Sleep Medicine.  [facility] Office Number: [phone]  Patricia Pesa, M.D.  Vilinda Boehringer, M.D.  Merton Border, M.D  05/03/2016

## 2016-06-10 ENCOUNTER — Encounter: Payer: Self-pay | Admitting: Internal Medicine

## 2016-06-10 ENCOUNTER — Ambulatory Visit: Payer: Medicare Other | Attending: Internal Medicine

## 2016-06-10 DIAGNOSIS — G4733 Obstructive sleep apnea (adult) (pediatric): Secondary | ICD-10-CM | POA: Insufficient documentation

## 2016-06-10 DIAGNOSIS — G4719 Other hypersomnia: Secondary | ICD-10-CM | POA: Diagnosis present

## 2016-06-15 ENCOUNTER — Telehealth: Payer: Self-pay | Admitting: *Deleted

## 2016-06-15 DIAGNOSIS — G4733 Obstructive sleep apnea (adult) (pediatric): Secondary | ICD-10-CM

## 2016-06-15 NOTE — Telephone Encounter (Signed)
Pt informed of sleep study results. CPAP titration ordered.

## 2016-06-15 NOTE — Telephone Encounter (Signed)
LMOVM for pt to call back for results. 

## 2016-06-23 DIAGNOSIS — G4733 Obstructive sleep apnea (adult) (pediatric): Secondary | ICD-10-CM | POA: Diagnosis not present

## 2016-07-01 ENCOUNTER — Ambulatory Visit: Payer: Medicare Other | Attending: Pulmonary Disease

## 2016-07-01 DIAGNOSIS — G4733 Obstructive sleep apnea (adult) (pediatric): Secondary | ICD-10-CM | POA: Diagnosis not present

## 2016-07-03 ENCOUNTER — Other Ambulatory Visit: Payer: Self-pay | Admitting: Family Medicine

## 2016-07-05 NOTE — Telephone Encounter (Signed)
Last lab 03/2016 abnormal. Is pt to continue high dose vitD? Please advise

## 2016-07-11 ENCOUNTER — Inpatient Hospital Stay: Payer: Medicare Other

## 2016-07-11 ENCOUNTER — Inpatient Hospital Stay: Payer: Medicare Other | Admitting: Internal Medicine

## 2016-07-11 DIAGNOSIS — G4733 Obstructive sleep apnea (adult) (pediatric): Secondary | ICD-10-CM | POA: Diagnosis not present

## 2016-07-18 ENCOUNTER — Telehealth: Payer: Self-pay | Admitting: *Deleted

## 2016-07-18 DIAGNOSIS — G4733 Obstructive sleep apnea (adult) (pediatric): Secondary | ICD-10-CM

## 2016-07-18 NOTE — Telephone Encounter (Signed)
LMOVM for pt to return call in regards to results.

## 2016-07-19 NOTE — Telephone Encounter (Signed)
LM for pt to return call back in regards to results.

## 2016-07-20 NOTE — Telephone Encounter (Signed)
Daughter informed ordering being placed for CPAP machine. Nothing further needed.

## 2016-07-22 ENCOUNTER — Telehealth: Payer: Self-pay

## 2016-07-22 NOTE — Telephone Encounter (Signed)
LM to reschedule upcoming appt with DR on 07/26/16 until after she is set up on cpap. Will await call back.

## 2016-07-26 ENCOUNTER — Ambulatory Visit: Payer: Medicare Other | Admitting: Internal Medicine

## 2016-07-26 ENCOUNTER — Other Ambulatory Visit: Payer: Self-pay | Admitting: *Deleted

## 2016-07-26 DIAGNOSIS — D509 Iron deficiency anemia, unspecified: Secondary | ICD-10-CM

## 2016-07-27 ENCOUNTER — Inpatient Hospital Stay: Payer: Medicare Other

## 2016-07-27 ENCOUNTER — Inpatient Hospital Stay: Payer: Medicare Other | Admitting: Internal Medicine

## 2016-08-04 ENCOUNTER — Encounter (INDEPENDENT_AMBULATORY_CARE_PROVIDER_SITE_OTHER): Payer: Self-pay

## 2016-08-04 ENCOUNTER — Inpatient Hospital Stay: Payer: Medicare Other | Attending: Internal Medicine | Admitting: Internal Medicine

## 2016-08-04 ENCOUNTER — Inpatient Hospital Stay: Payer: Medicare Other

## 2016-08-04 VITALS — BP 163/65 | HR 72 | Temp 98.5°F | Resp 18 | Wt 194.0 lb

## 2016-08-04 DIAGNOSIS — K219 Gastro-esophageal reflux disease without esophagitis: Secondary | ICD-10-CM | POA: Diagnosis not present

## 2016-08-04 DIAGNOSIS — I10 Essential (primary) hypertension: Secondary | ICD-10-CM | POA: Diagnosis not present

## 2016-08-04 DIAGNOSIS — D509 Iron deficiency anemia, unspecified: Secondary | ICD-10-CM

## 2016-08-04 DIAGNOSIS — Z79899 Other long term (current) drug therapy: Secondary | ICD-10-CM | POA: Diagnosis not present

## 2016-08-04 LAB — CBC WITH DIFFERENTIAL/PLATELET
BASOS ABS: 0.1 10*3/uL (ref 0–0.1)
BASOS PCT: 1 %
EOS ABS: 0.1 10*3/uL (ref 0–0.7)
Eosinophils Relative: 1 %
HEMATOCRIT: 42.4 % (ref 35.0–47.0)
Hemoglobin: 14.1 g/dL (ref 12.0–16.0)
Lymphocytes Relative: 16 %
Lymphs Abs: 1.6 10*3/uL (ref 1.0–3.6)
MCH: 31.2 pg (ref 26.0–34.0)
MCHC: 33.2 g/dL (ref 32.0–36.0)
MCV: 93.7 fL (ref 80.0–100.0)
MONO ABS: 0.7 10*3/uL (ref 0.2–0.9)
Monocytes Relative: 7 %
NEUTROS ABS: 7.4 10*3/uL — AB (ref 1.4–6.5)
NEUTROS PCT: 75 %
PLATELETS: 295 10*3/uL (ref 150–440)
RBC: 4.52 MIL/uL (ref 3.80–5.20)
RDW: 14.3 % (ref 11.5–14.5)
WBC: 9.8 10*3/uL (ref 3.6–11.0)

## 2016-08-04 NOTE — Progress Notes (Signed)
[facility] OFFICE PROGRESS NOTE  Patient Care Team: Lucille Passy, MD as PCP - General Dallas Schimke, MD as Consulting Physician (Internal Medicine)  No matching staging information was found for the patient.    No history exists.   # IDA- ? Etiology Chronic Iron PO BID.    INTERVAL HISTORY:  Melanie Bird 75 y.o.  female pleasant patient above history of Anemia likely iron deficiency of unclear etiology- is here for follow-up. Patient continues to be on by mouth iron twice a day. Denies any problems tolerating by mouth iron. No blood in stools. No nausea no vomiting no weight loss. No chest pain or shortness of breath or cough.  Patient has chronic arthritis. Complains of difficulty walking because of arthritis. Requesting an disability parking past.  REVIEW OF SYSTEMS:  A complete 10 point review of system is done which is negative except mentioned above/history of present illness.   PAST MEDICAL HISTORY :  Past Medical History:  Diagnosis Date  . Anemia   . GERD (gastroesophageal reflux disease)   . Hypertension     PAST SURGICAL HISTORY :  No past surgical history on file.  FAMILY HISTORY :   Family History  Problem Relation Age of Onset  . Diabetes Father   . Hypertension Sister     SOCIAL HISTORY:   Social History  Substance Use Topics  . Smoking status: Never Smoker  . Smokeless tobacco: Never Used  . Alcohol use No    ALLERGIES:  has No Known Allergies.  MEDICATIONS:  Current Outpatient Prescriptions  Medication Sig Dispense Refill  . chlorpheniramine-HYDROcodone (TUSSIONEX PENNKINETIC ER) 10-8 MG/5ML SUER Take 5 mLs by mouth every 12 (twelve) hours as needed. 140 mL 0  . FeFum-FePo-FA-B Cmp-C-Zn-Mn-Cu (SE-TAN PLUS) 162-115.2-1 MG CAPS Take 1 capsule by mouth daily.  11  . metoprolol tartrate (LOPRESSOR) 25 MG tablet TAKE 1 TABLET (25 MG TOTAL) BY MOUTH 2 (TWO) TIMES DAILY. 60 tablet 11  . Multiple Vitamin (MULTIVITAMIN) tablet Take 1  tablet by mouth daily.    Marland Kitchen omeprazole (PRILOSEC) 20 MG capsule Take 1 capsule (20 mg total) by mouth daily. 30 capsule 6  . senna-docusate (SENNA S) 8.6-50 MG per tablet Take 1 tablet by mouth 2 (two) times daily. (Patient taking differently: Take 1 tablet by mouth at bedtime as needed. ) 60 tablet 2  . Vitamin D, Ergocalciferol, (DRISDOL) 50000 units CAPS capsule TAKE 1 CAPSULE (50,000 UNITS TOTAL) BY MOUTH EVERY 7 (SEVEN) DAYS. 6 capsule 0   No current facility-administered medications for this visit.     PHYSICAL EXAMINATION:  BP (!) 163/65 (BP Location: Left Arm, Patient Position: Sitting)   Pulse 72   Temp 98.5 F (36.9 C) (Tympanic)   Resp 18   Wt 194 lb (88 kg)   BMI 39.18 kg/m   Filed Weights   08/04/16 1519  Weight: 194 lb (88 kg)    GENERAL: Well-nourished well-developed; Alert, no distress and comfortable.  Alone. Walks with a cane. EYES: no pallor or icterus OROPHARYNX: no thrush or ulceration; good dentition  NECK: supple, no masses felt LYMPH:  no palpable lymphadenopathy in the cervical, axillary or inguinal regions LUNGS: clear to auscultation and  No wheeze or crackles HEART/CVS: regular rate & rhythm and no murmurs; No lower extremity edema ABDOMEN:abdomen soft, non-tender and normal bowel sounds Musculoskeletal:no cyanosis of digits and no clubbing  PSYCH: alert & oriented x 3 with fluent speech NEURO: no focal motor/sensory deficits SKIN:  no rashes or significant lesions  LABORATORY DATA:  I have reviewed the data as listed    Component Value Date/Time   NA 143 11/10/2015 1227   K 4.3 11/10/2015 1227   CL 113 (H) 11/10/2015 1227   CO2 24 11/10/2015 1227   GLUCOSE 158 (H) 11/10/2015 1227   BUN 11 11/10/2015 1227   CREATININE 0.74 11/10/2015 1227   CALCIUM 9.5 11/10/2015 1227   PROT 5.9 (L) 11/10/2015 1227   ALBUMIN 3.2 (L) 11/10/2015 1227   AST 18 11/10/2015 1227   ALT 12 (L) 11/10/2015 1227   ALKPHOS 71 11/10/2015 1227   BILITOT 0.2 (L)  11/10/2015 1227   GFRNONAA >60 11/10/2015 1227   GFRAA >60 11/10/2015 1227    No results found for: SPEP, UPEP  Lab Results  Component Value Date   WBC 9.8 08/04/2016   NEUTROABS 7.4 (H) 08/04/2016   HGB 14.1 08/04/2016   HCT 42.4 08/04/2016   MCV 93.7 08/04/2016   PLT 295 08/04/2016      Chemistry      Component Value Date/Time   NA 143 11/10/2015 1227   K 4.3 11/10/2015 1227   CL 113 (H) 11/10/2015 1227   CO2 24 11/10/2015 1227   BUN 11 11/10/2015 1227   CREATININE 0.74 11/10/2015 1227      Component Value Date/Time   CALCIUM 9.5 11/10/2015 1227   ALKPHOS 71 11/10/2015 1227   AST 18 11/10/2015 1227   ALT 12 (L) 11/10/2015 1227   BILITOT 0.2 (L) 11/10/2015 1227       RADIOGRAPHIC STUDIES: I have personally reviewed the radiological images as listed and agreed with the findings in the report. No results found.   ASSESSMENT & PLAN:  ANEMIA-IRON DEFICIENCY Chronic-unclear etiology question. No obvious GI blood loss. Recommend continued by mouth iron twice a day. Patient's hemoglobin today is 14.  # Recommend follow-up in one year with labs and studies.  # Disability/parking- recommend follow-up with PCP.   No orders of the defined types were placed in this encounter.  All questions were answered. The patient knows to call the clinic with any problems, questions or concerns.      Cammie Sickle, MD 08/04/2016 3:45 PM

## 2016-08-04 NOTE — Assessment & Plan Note (Signed)
Chronic-unclear etiology question. No obvious GI blood loss. Recommend continued by mouth iron twice a day. Patient's hemoglobin today is 14.  # Recommend follow-up in one year with labs and studies.  # Disability/parking- recommend follow-up with PCP.

## 2016-08-04 NOTE — Progress Notes (Signed)
Patient states she is feeling much better.  Has more energy.  Requesting form be filled out for [facility] placard.

## 2016-08-05 ENCOUNTER — Telehealth: Payer: Self-pay | Admitting: Family Medicine

## 2016-08-05 NOTE — Telephone Encounter (Signed)
Pt dropped off DMV placard form to be filled out. She is requesting due to being anemic and bad knee problems. I placed form in Rx tower. Please call when ready for p/u.

## 2016-08-10 ENCOUNTER — Telehealth: Payer: Self-pay | Admitting: *Deleted

## 2016-08-10 ENCOUNTER — Telehealth: Payer: Self-pay

## 2016-08-10 LAB — METHYLMALONIC ACID, SERUM: Methylmalonic Acid, Quantitative: 150 nmol/L (ref 0–378)

## 2016-08-10 NOTE — Telephone Encounter (Signed)
Per Elease Etienne, pt called and was transferred to his phone. Asking to speak to [facility]

## 2016-08-10 NOTE — Telephone Encounter (Signed)
Returned pt's phone call regarding her questions on permanent handicap card.  Per a note in computer [facility] has filled out her paperwork and ready for pickup.  I verbalized to pt she would need to pick up her paperwork from that clinic and take it to the [facility].  Pt under impression that we would give her the card.  I explained again that she would need to take it to [facility] and they would issue it if her PCP filled it out.  She asked how long it would be good for, I verbalized it depended on how it was filled out and the DMV would be able to answer that question.  No further questions.

## 2016-08-10 NOTE — Telephone Encounter (Signed)
Spoke to pt and informed her form is available for pickup from the front desk 

## 2016-08-15 ENCOUNTER — Other Ambulatory Visit: Payer: Self-pay

## 2016-08-15 ENCOUNTER — Other Ambulatory Visit: Payer: Self-pay | Admitting: Family Medicine

## 2016-09-18 ENCOUNTER — Encounter: Payer: Self-pay | Admitting: Internal Medicine

## 2016-09-20 ENCOUNTER — Encounter: Payer: Self-pay | Admitting: Internal Medicine

## 2016-09-21 ENCOUNTER — Encounter: Payer: Self-pay | Admitting: Internal Medicine

## 2016-09-21 ENCOUNTER — Ambulatory Visit (INDEPENDENT_AMBULATORY_CARE_PROVIDER_SITE_OTHER): Payer: Medicare Other | Admitting: Internal Medicine

## 2016-09-21 VITALS — BP 136/74 | HR 80 | Ht 59.0 in | Wt 195.0 lb

## 2016-09-21 DIAGNOSIS — G4733 Obstructive sleep apnea (adult) (pediatric): Secondary | ICD-10-CM

## 2016-09-21 DIAGNOSIS — Z23 Encounter for immunization: Secondary | ICD-10-CM

## 2016-09-21 NOTE — Progress Notes (Signed)
[facility] Consultation      Assessment and Plan:  Obstructive sleep apnea.  --Sleep study 06/10/16; AHI of 8, started on CPAP.  --Appears to be doing well on PAP, will continue.  Cough. Due to recent URI, doing better.   Hiatal hernia/intrathoracic stomach. --With gerd symptoms, continue omeprazole.   Chronic rhinitis.  --Can use flonase if becomes problematic.   Date: 09/21/2016  MRN# [id number] Melanie Bird [birth year]  Referring Physician: Dr. Tanja Port.   Melanie Bird is a 75 y.o. old female seen in consultation for chief complaint of:    Chief Complaint  Patient presents with  . Follow-up    50mo rov. pt wearing cpap nightly, feels pressure & mask are okay. pt wakes feeling well rested. DME:[facility]    HPI:   The patient is a 75 yo female referred due to complaints of snoring, fatigue and cough. At last visit she was sent for a sleep study study, and has since been on CPAP.   She notes that she is feeling more rested, she is more rested during the day, and she notes that she gets up to urinate less at night. She is no longer snoring.  She continues to use the nasal spray occasionally, her nasal congestion is better.   Sleep study results and tracings reviewed: Patient underwent sleep study on 06/10/16 which showed OSA with AHI of 8.    PAP titration study on 07/01/16;  Review of download data shows average usage is 7 hours and 50 minutes. She use to 90% of days. Set pressure is 10 cm. Residual AHI is 2.8.   CXR 04/11/16: obesity and large hiatal hernia/intrathoracic stomach which did not look significantly different from previous film from 08/01/14.   Medication:   reviewed    Allergies:  Review of patient's allergies indicates no known allergies.  Review of Systems: Gen:  Denies  fever, sweats, chills HEENT: Denies blurred vision, double vision. bleeds, Cvc:  No dizziness, chest pain. Resp:   Denies cough or sputum production, shortness of  breath Skin: No skin rash,  hives  Endoc:  No polyuria, polydipsia. Psych: No depression, insomnia. Other:  All other systems were reviewed with the patient and were negative other that what is mentioned in the HPI.   Physical Examination:   VS: BP 136/74 (BP Location: Left Arm, Cuff Size: Normal)   Pulse 80   Ht 4\' 11"  (1.499 m)   Wt 195 lb (88.5 kg)   SpO2 96%   BMI 39.39 kg/m   General Appearance: No distress  Neuro:without focal findings,  speech normal,  HEENT: PERRLA, EOM intact.  [place] 3.  Pulmonary: normal breath sounds, No wheezing.  CardiovascularNormal S1,S2.  No m/r/g.   Abdomen: Benign, Soft, non-tender. Renal:  No costovertebral tenderness  GU:  No performed at this time. Endoc: No evident thyromegaly, no signs of acromegaly. Skin:   warm, no rashes, no ecchymosis  Extremities: normal, no cyanosis, clubbing.  Other findings:    LABORATORY PANEL:   CBC No results for input(s): WBC, HGB, HCT, PLT in the last 168 hours. ------------------------------------------------------------------------------------------------------------------  Chemistries  No results for input(s): NA, K, CL, CO2, GLUCOSE, BUN, CREATININE, CALCIUM, MG, AST, ALT, ALKPHOS, BILITOT in the last 168 hours.  Invalid input(s): GFRCGP ------------------------------------------------------------------------------------------------------------------  Cardiac Enzymes No results for input(s): TROPONINI in the last 168 hours. ------------------------------------------------------------  RADIOLOGY:  No results found.     Thank  you for the consultation and for allowing [facility] to assist in the care of your patient. Our recommendations are noted above.  Please contact us if we can be of further service.   Marda Stalker, MD.  Board Certified in Internal Medicine, Pulmonary Medicine, [place], and Sleep Medicine.  [facility] Office Number: [phone]  Patricia Pesa, M.D.  Vilinda Boehringer, M.D.  Merton Border, M.D  09/21/2016

## 2016-09-21 NOTE — Patient Instructions (Addendum)
-  Use rhinocort nasal spray at night when needed.   --Continue using CPAP every night.   --You have a hiatal hernia, continue to take your omeprazole every day to prevent pneumonia.   --will administer flu vaccine today.

## 2016-09-27 ENCOUNTER — Telehealth: Payer: Self-pay

## 2016-09-27 NOTE — Telephone Encounter (Signed)
Pt would like Rhinocort nasal spray called to [facility].

## 2016-09-27 NOTE — Telephone Encounter (Signed)
Pt has called and requested we call in Rhinocort nasal spray to her pharmacy. Please advise if we can send in.

## 2016-09-28 NOTE — Telephone Encounter (Signed)
LM for pt. Will await call back.

## 2016-09-28 NOTE — Telephone Encounter (Signed)
May send in script for rhinocort, 2 sprays in each nostril once daily- 90 days with 3 refills.   This is an over the counter medication so it might not be covered by insurance.

## 2016-09-29 ENCOUNTER — Encounter: Payer: Self-pay | Admitting: Family Medicine

## 2016-09-29 ENCOUNTER — Ambulatory Visit (INDEPENDENT_AMBULATORY_CARE_PROVIDER_SITE_OTHER): Payer: Medicare Other | Admitting: Family Medicine

## 2016-09-29 VITALS — BP 126/70 | HR 71 | Temp 97.9°F | Wt 190.8 lb

## 2016-09-29 DIAGNOSIS — J069 Acute upper respiratory infection, unspecified: Secondary | ICD-10-CM

## 2016-09-29 MED ORDER — AZITHROMYCIN 250 MG PO TABS: ORAL_TABLET | ORAL | 0 refills | Status: DC

## 2016-09-29 NOTE — Patient Instructions (Signed)
Great to see you. Take your zpack as directed for bronchitis.  Drink lots of fluids.

## 2016-09-29 NOTE — Progress Notes (Signed)
Pre visit review using our clinic review tool, if applicable. No additional management support is needed unless otherwise documented below in the visit note. 

## 2016-09-29 NOTE — Progress Notes (Signed)
SUBJECTIVE:  Melanie Bird is a 75 y.o. female who complains of coryza, congestion, sore throat and productive cough for 7 days. She denies a history of anorexia and chest pain and denies a history of asthma. Patient denies smoke cigarettes.   Current Outpatient Prescriptions on File Prior to Visit  Medication Sig Dispense Refill  . FeFum-FePo-FA-B Cmp-C-Zn-Mn-Cu (SE-TAN PLUS) 162-115.2-1 MG CAPS Take 1 capsule by mouth daily.  11  . metoprolol tartrate (LOPRESSOR) 25 MG tablet TAKE 1 TABLET (25 MG TOTAL) BY MOUTH 2 (TWO) TIMES DAILY. 60 tablet 11  . Multiple Vitamin (MULTIVITAMIN) tablet Take 1 tablet by mouth daily.    Marland Kitchen omeprazole (PRILOSEC) 20 MG capsule Take 1 capsule (20 mg total) by mouth daily. 30 capsule 6  . senna-docusate (SENNA S) 8.6-50 MG per tablet Take 1 tablet by mouth 2 (two) times daily. (Patient taking differently: Take 1 tablet by mouth at bedtime as needed. ) 60 tablet 2  . Vitamin D, Ergocalciferol, (DRISDOL) 50000 units CAPS capsule TAKE 1 CAPSULE (50,000 UNITS TOTAL) BY MOUTH EVERY 7 (SEVEN) DAYS. 6 capsule 0   No current facility-administered medications on file prior to visit.     No Known Allergies  Past Medical History:  Diagnosis Date  . Anemia   . GERD (gastroesophageal reflux disease)   . Hypertension     No past surgical history on file.  Family History  Problem Relation Age of Onset  . Diabetes Father   . Hypertension Sister     Social History   Social History  . Marital status: Married    Spouse name: N/A  . Number of children: N/A  . Years of education: N/A   Occupational History  . Not on file.   Social History Main Topics  . Smoking status: Never Smoker  . Smokeless tobacco: Never Used  . Alcohol use No  . Drug use: No  . Sexual activity: Not Currently   Other Topics Concern  . Not on file   Social History Narrative  . No narrative on file   The PMH, PSH, Social History, Family History, Medications, and allergies have been  reviewed in [facility], and have been updated if relevant.  OBJECTIVE: BP 126/70   Pulse 71   Temp 97.9 F (36.6 C) (Oral)   Wt 190 lb 12 oz (86.5 kg)   SpO2 97%   BMI 38.53 kg/m   She appears well, vital signs are as noted. Ears normal.  Throat and pharynx normal.  Neck supple. No adenopathy in the neck. Nose is congested. Sinuses non tender. Chest: wheezes   ASSESSMENT:  bronchitis  PLAN: Zpack as directed. Symptomatic therapy suggested: push fluids, rest and return office visit prn if symptoms persist or worsen. Call or return to clinic prn if these symptoms worsen or fail to improve as anticipated.

## 2016-10-03 NOTE — Telephone Encounter (Signed)
LM x 2

## 2016-10-06 ENCOUNTER — Telehealth: Payer: Self-pay | Admitting: Family Medicine

## 2016-10-06 NOTE — Telephone Encounter (Signed)
In Dr. Hulen Shouts inbox

## 2016-10-06 NOTE — Telephone Encounter (Signed)
Pt dropped off DMV disability form to be filled out for placard. Please call when ready to pick up. I placed in Rx tower.

## 2016-10-06 NOTE — Telephone Encounter (Signed)
Spoke with pt and she states she bought something OTC. Pt also informed that she had a few days when she didn't wear her CPAP because she had bronchitis and was congested. Nothing further needed.

## 2016-10-12 NOTE — Telephone Encounter (Signed)
Spoke to pt and informed her placard is available for pickup from the front desk

## 2016-10-21 ENCOUNTER — Telehealth: Payer: Self-pay | Admitting: Family Medicine

## 2016-10-21 NOTE — Telephone Encounter (Signed)
LVM for pt to call back and schedule AWV + labs with Lesia and CPE with PCP. °

## 2016-10-31 ENCOUNTER — Other Ambulatory Visit: Payer: Self-pay | Admitting: Family Medicine

## 2016-10-31 NOTE — Telephone Encounter (Signed)
Is pt needing to continue high dose? 

## 2016-11-16 ENCOUNTER — Ambulatory Visit (INDEPENDENT_AMBULATORY_CARE_PROVIDER_SITE_OTHER): Payer: Medicare Other

## 2016-11-16 ENCOUNTER — Telehealth: Payer: Self-pay

## 2016-11-16 VITALS — BP 130/82 | HR 70 | Temp 98.1°F | Ht <= 58 in | Wt 192.5 lb

## 2016-11-16 DIAGNOSIS — Z136 Encounter for screening for cardiovascular disorders: Secondary | ICD-10-CM | POA: Diagnosis not present

## 2016-11-16 DIAGNOSIS — E559 Vitamin D deficiency, unspecified: Secondary | ICD-10-CM | POA: Diagnosis not present

## 2016-11-16 DIAGNOSIS — Z0000 Encounter for general adult medical examination without abnormal findings: Secondary | ICD-10-CM

## 2016-11-16 DIAGNOSIS — I10 Essential (primary) hypertension: Secondary | ICD-10-CM | POA: Diagnosis not present

## 2016-11-16 LAB — LIPID PANEL
CHOL/HDL RATIO: 4
Cholesterol: 228 mg/dL — ABNORMAL HIGH (ref 0–200)
HDL: 64.9 mg/dL (ref >39.00)
NonHDL: 163.34
Triglycerides: 305 mg/dL — ABNORMAL HIGH (ref 0.0–149.0)
VLDL: 61 mg/dL — AB (ref 0.0–40.0)

## 2016-11-16 LAB — VITAMIN D 25 HYDROXY (VIT D DEFICIENCY, FRACTURES): VITD: 54.1 ng/mL (ref 30.00–100.00)

## 2016-11-16 LAB — TSH: TSH: 2.14 u[IU]/mL (ref 0.35–4.50)

## 2016-11-16 LAB — LDL CHOLESTEROL, DIRECT: Direct LDL: 131 mg/dL

## 2016-11-16 NOTE — Progress Notes (Signed)
PCP notes:   Health maintenance:  PCV13 - pt will discuss vaccine with PCP at next appt Bone density - pt will discuss this screening and mammogram with PCP at next appt Tetanus - per insurance, this vaccine is not covered  Abnormal screenings:   Hearing - failed  Patient concerns:   None  Nurse concerns:  Discussed with patient about bone density and mammogram. Pt has been encouraged to be assessed by PCP prior to scheduling these screenings.   Next PCP appt:   11/22/16 @ 1315

## 2016-11-16 NOTE — Progress Notes (Signed)
Pre visit review using our clinic review tool, if applicable. No additional management support is needed unless otherwise documented below in the visit note. 

## 2016-11-16 NOTE — Patient Instructions (Signed)
Ms. Bogdon , Thank you for taking time to come for your Medicare Wellness Visit. I appreciate your ongoing commitment to your health goals. Please review the following plan we discussed and let me know if I can assist you in the future.   These are the goals we discussed: Goals    . Increase water intake          Starting 11/16/2016, I will continue to drink at least 5-6 glasses of water daily.        This is a list of the screening recommended for you and due dates:  Health Maintenance  Topic Date Due  . Pneumonia vaccines (2 of 2 - PCV13) 11/21/2016*  . DEXA scan (bone density measurement)  11/16/2017*  . Tetanus Vaccine  12/18/2021*  . Flu Shot  Completed  . Shingles Vaccine  Completed  *Topic was postponed. The date shown is not the original due date.   Preventive Care for Adults  A healthy lifestyle and preventive care can promote health and wellness. Preventive health guidelines for adults include the following key practices.  . A routine yearly physical is a good way to check with your health care provider about your health and preventive screening. It is a chance to share any concerns and updates on your health and to receive a thorough exam.  . Visit your dentist for a routine exam and preventive care every 6 months. Brush your teeth twice a day and floss once a day. Good oral hygiene prevents tooth decay and gum disease.  . The frequency of eye exams is based on your age, health, family medical history, use  of contact lenses, and other factors. Follow your health care provider's ecommendations for frequency of eye exams.  . Eat a healthy diet. Foods like vegetables, fruits, whole grains, low-fat dairy products, and lean protein foods contain the nutrients you need without too many calories. Decrease your intake of foods high in solid fats, added sugars, and salt. Eat the right amount of calories for you. Get information about a proper diet from your health care provider, if  necessary.  . Regular physical exercise is one of the most important things you can do for your health. Most adults should get at least 150 minutes of moderate-intensity exercise (any activity that increases your heart rate and causes you to sweat) each week. In addition, most adults need muscle-strengthening exercises on 2 or more days a week.  Silver Sneakers may be a benefit available to you. To determine eligibility, you may visit the website: www.silversneakers.com or contact program at 570-495-1056 Mon-Fri between 8AM-8PM.   . Maintain a healthy weight. The body mass index (BMI) is a screening tool to identify possible weight problems. It provides an estimate of body fat based on height and weight. Your health care provider can find your BMI and can help you achieve or maintain a healthy weight.   For adults 20 years and older: ? A BMI below 18.5 is considered underweight. ? A BMI of 18.5 to 24.9 is normal. ? A BMI of 25 to 29.9 is considered overweight. ? A BMI of 30 and above is considered obese.   . Maintain normal blood lipids and cholesterol levels by exercising and minimizing your intake of saturated fat. Eat a balanced diet with plenty of fruit and vegetables. Blood tests for lipids and cholesterol should begin at age 33 and be repeated every 5 years. If your lipid or cholesterol levels are high, you are over 50,  or you are at high risk for heart disease, you may need your cholesterol levels checked more frequently. Ongoing high lipid and cholesterol levels should be treated with medicines if diet and exercise are not working.  . If you smoke, find out from your health care provider how to quit. If you do not use tobacco, please do not start.  . If you choose to drink alcohol, please do not consume more than 2 drinks per day. One drink is considered to be 12 ounces (355 mL) of beer, 5 ounces (148 mL) of wine, or 1.5 ounces (44 mL) of liquor.  . If you are 69-2 years old, ask your  health care provider if you should take aspirin to prevent strokes.  . Use sunscreen. Apply sunscreen liberally and repeatedly throughout the day. You should seek shade when your shadow is shorter than you. Protect yourself by wearing long sleeves, pants, a wide-brimmed hat, and sunglasses year round, whenever you are outdoors.  . Once a month, do a whole body skin exam, using a mirror to look at the skin on your back. Tell your health care provider of new moles, moles that have irregular borders, moles that are larger than a pencil eraser, or moles that have changed in shape or color.

## 2016-11-16 NOTE — Progress Notes (Signed)
Subjective:   Melanie Bird is a 75 y.o. female who presents for an Initial Medicare Annual Wellness Visit.  Review of Systems    N/A  Cardiac Risk Factors include: advanced age (>51men, >63 women);obesity (BMI >30kg/m2);hypertension     Objective:    Today's Vitals   11/16/16 1316  BP: 130/82  Pulse: 70  Temp: 98.1 F (36.7 C)  TempSrc: Oral  SpO2: 97%  Weight: 192 lb 8 oz (87.3 kg)  Height: 4\' 10"  (1.473 m)  PainSc: 0-No pain   Body mass index is 40.23 kg/m.   Current Medications (verified) Outpatient Encounter Prescriptions as of 11/16/2016  Medication Sig  . FeFum-FePo-FA-B Cmp-C-Zn-Mn-Cu (SE-TAN PLUS) 162-115.2-1 MG CAPS Take 1 capsule by mouth daily.  . Ferrous Gluconate (IRON 27 PO) Take 2 capsules by mouth daily.  . metoprolol tartrate (LOPRESSOR) 25 MG tablet TAKE 1 TABLET (25 MG TOTAL) BY MOUTH 2 (TWO) TIMES DAILY.  . Multiple Vitamin (MULTIVITAMIN) tablet Take 1 tablet by mouth daily.  Marland Kitchen omeprazole (PRILOSEC) 20 MG capsule Take 1 capsule (20 mg total) by mouth daily.  . Vitamin D, Ergocalciferol, (DRISDOL) 50000 units CAPS capsule TAKE 1 CAPSULE (50,000 UNITS TOTAL) BY MOUTH EVERY 7 (SEVEN) DAYS.  . [DISCONTINUED] senna-docusate (SENNA S) 8.6-50 MG per tablet Take 1 tablet by mouth 2 (two) times daily. (Patient taking differently: Take 1 tablet by mouth at bedtime as needed. )  . [DISCONTINUED] azithromycin (ZITHROMAX) 250 MG tablet 2 tabs by mouth on day 1 followed by 1 tab by mouth daily days 2-5   No facility-administered encounter medications on file as of 11/16/2016.     Allergies (verified) Patient has no known allergies.   History: Past Medical History:  Diagnosis Date  . Anemia   . GERD (gastroesophageal reflux disease)   . Hypertension    History reviewed. No pertinent surgical history. Family History  Problem Relation Age of Onset  . Diabetes Father   . Hypertension Sister    Social History   Occupational History  . Not on file.     Social History Main Topics  . Smoking status: Never Smoker  . Smokeless tobacco: Never Used  . Alcohol use No  . Drug use: No  . Sexual activity: Not Currently    Tobacco Counseling Counseling given: No   Activities of Daily Living In your present state of health, do you have any difficulty performing the following activities: 11/16/2016  Hearing? Y  Vision? N  Difficulty concentrating or making decisions? N  Walking or climbing stairs? Y  Dressing or bathing? N  Doing errands, shopping? N  In the past six months, have you accidently leaked urine? N  Do you have problems with loss of bowel control? N  Managing your Medications? N  Managing your Finances? N  Housekeeping or managing your Housekeeping? N  Some recent data might be hidden    Immunizations and Health Maintenance Immunization History  Administered Date(s) Administered  . Influenza, High Dose Seasonal PF 09/21/2016  . Influenza,inj,Quad PF,36+ Mos 10/26/2015  . Influenza-Unspecified 08/19/2014  . Pneumococcal Polysaccharide-23 09/12/2006  . Td 12/19/2001  . Zoster 09/12/2006   There are no preventive care reminders to display for this patient.  Patient Care Team: Lucille Passy, MD as PCP - General Dallas Schimke, MD as Consulting Physician (Internal Medicine)     Assessment:   This is a routine wellness examination for [place].   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 Increase water intake          Starting 11/16/2016, I will continue to drink at least 5-6 glasses of water daily.       Depression Screen PHQ 2/9 Scores 11/16/2016  PHQ - 2 Score 0    Fall Risk Fall Risk  11/16/2016  Falls in the past year? No    Cognitive Function: MMSE - Mini Mental State Exam 11/16/2016  Orientation to time 5  Orientation to Place 5  Registration 3  Attention/ Calculation 0  Recall 3  Language- name 2 objects 0  Language- repeat 1  Language- follow 3 step command 3  Language- read & follow direction 0  Write a sentence 0  Copy design 0  Total score 20     PLEASE NOTE: A Mini-Cog screen was completed. Maximum score is 20. A value of 0 denotes this part of Folstein MMSE was not completed or the patient failed this part of the Mini-Cog screening.   Mini-Cog Screening Orientation to Time - Max 5 pts Orientation to Place - Max 5 pts Registration - Max 3 pts Recall - Max 3 pts Language Repeat - Max 1 pts Language Follow 3 Step Command - Max 3 pts     Screening Tests Health Maintenance  Topic Date Due  . PNA vac Low Risk Adult (2 of 2 - PCV13) 11/21/2016 (Originally 09/13/2007)  . DEXA SCAN  11/16/2017 (Originally 01/10/2004)  . TETANUS/TDAP  12/18/2021 (Originally 12/20/2011)  . INFLUENZA VACCINE  Completed  . ZOSTAVAX  Completed      Plan:     I have personally reviewed and addressed the Medicare Annual Wellness questionnaire and have noted the following in the patient's chart:  A. Medical and social history B. Use of alcohol, tobacco or illicit drugs  C. Current medications and supplements D. Functional ability and status E.  Nutritional status F.  Physical activity G. Advance directives H. List of other physicians I.  Hospitalizations, surgeries, and ER visits in previous 12 months J.  [place] to include hearing, vision, cognitive, depression L. Referrals and appointments - none  In  addition, I have reviewed and discussed with patient certain preventive protocols, quality metrics, and best practice recommendations. A written personalized care plan for preventive services as well as general preventive health recommendations were provided to patient.  See attached scanned questionnaire for additional information.   Signed,   Lindell Noe, MHA, BS, LPN Health Coach

## 2016-11-16 NOTE — Telephone Encounter (Signed)
Per pt request, contacted [employer] to determine coverage for tetanus vaccine. Per Jeneen Rinks, this vaccine is not covered under pt's PART D.   Attempted to reach pt to notify her of information. Left voicemail msg with contact info.

## 2016-11-17 NOTE — Telephone Encounter (Signed)
Pt left v/m returning call about tetanus shot and request cb.

## 2016-11-17 NOTE — Progress Notes (Signed)
Medical screening examination/treatment/procedure(s) were performed by registered nurse and as non- physician practitioner I was immediately available for consultation/collaboration.  Dayelin Balducci, NP   

## 2016-11-22 ENCOUNTER — Encounter: Payer: Medicare Other | Admitting: Family Medicine

## 2016-11-27 ENCOUNTER — Other Ambulatory Visit: Payer: Self-pay | Admitting: Family Medicine

## 2016-12-08 ENCOUNTER — Telehealth: Payer: Self-pay

## 2016-12-08 NOTE — Telephone Encounter (Signed)
Pt left v/m request se tan refill; I spoke with [facility] and pt picked up earlier today. I spoke with pt and she did get the se tan.

## 2016-12-16 ENCOUNTER — Other Ambulatory Visit: Payer: Self-pay | Admitting: Family Medicine

## 2016-12-16 NOTE — Telephone Encounter (Signed)
[facility] called for refill of Vit D 50000. After [facility]; nothing further needed,

## 2016-12-20 ENCOUNTER — Encounter: Payer: Medicare Other | Admitting: Family Medicine

## 2017-01-18 ENCOUNTER — Encounter: Payer: Medicare Other | Admitting: Family Medicine

## 2017-01-25 ENCOUNTER — Encounter: Payer: Medicare Other | Admitting: Family Medicine

## 2017-01-27 ENCOUNTER — Other Ambulatory Visit: Payer: Self-pay | Admitting: Family Medicine

## 2017-01-27 NOTE — Telephone Encounter (Signed)
Pt request refill metoprolol to [facility]. Pt last seen f/u11/07/16; pt saw Lattie Haw for part 1 of annual exam on 11/16/16 and pt has appt to see dr Deborra Medina for CPX on 02/08/17. Refill metoprolol # 60 x 0. Pt voiced understanding.

## 2017-01-29 ENCOUNTER — Other Ambulatory Visit: Payer: Self-pay | Admitting: Family Medicine

## 2017-01-30 NOTE — Telephone Encounter (Signed)
Is pt needing to continue high dose vit D? pls advise

## 2017-02-08 ENCOUNTER — Ambulatory Visit (INDEPENDENT_AMBULATORY_CARE_PROVIDER_SITE_OTHER): Payer: Medicare Other | Admitting: Family Medicine

## 2017-02-08 VITALS — BP 132/80 | HR 82 | Temp 97.4°F | Ht 58.5 in | Wt 194.8 lb

## 2017-02-08 DIAGNOSIS — Z23 Encounter for immunization: Secondary | ICD-10-CM

## 2017-02-08 DIAGNOSIS — D509 Iron deficiency anemia, unspecified: Secondary | ICD-10-CM | POA: Diagnosis not present

## 2017-02-08 DIAGNOSIS — I10 Essential (primary) hypertension: Secondary | ICD-10-CM | POA: Diagnosis not present

## 2017-02-08 DIAGNOSIS — F339 Major depressive disorder, recurrent, unspecified: Secondary | ICD-10-CM | POA: Diagnosis not present

## 2017-02-08 DIAGNOSIS — K219 Gastro-esophageal reflux disease without esophagitis: Secondary | ICD-10-CM

## 2017-02-08 NOTE — Patient Instructions (Signed)
Great to see you!   

## 2017-02-08 NOTE — Progress Notes (Signed)
Subjective:   Patient ID: Melanie Bird, female    DOB: 1941/07/12, 76 y.o.   MRN: [id number]  Melanie Bird is a pleasant 76 y.o. year old female who presents to clinic today with Annual Exam  on 02/08/2017  HPI:   Annual medicare wellness visit with Candis Musa, RN on 11/16/16. Notes reviewed.  HTN- has been well controlled on current dose of metoprolol.  GERD symptoms have been controlled with prilosec.  She does not take this daily.   Current Outpatient Prescriptions on File Prior to Visit  Medication Sig Dispense Refill  . FeFum-FePo-FA-B Cmp-C-Zn-Mn-Cu (SE-TAN PLUS) 162-115.2-1 MG CAPS TAKE 1 CAPSULE BY MOUTH DAILY. 30 capsule 10  . metoprolol tartrate (LOPRESSOR) 25 MG tablet TAKE 1 TABLET (25 MG TOTAL) BY MOUTH 2 (TWO) TIMES DAILY. 60 tablet 0  . omeprazole (PRILOSEC) 20 MG capsule Take 1 capsule (20 mg total) by mouth daily. 30 capsule 6  . Vitamin D, Ergocalciferol, (DRISDOL) 50000 units CAPS capsule TAKE 1 CAPSULE (50,000 UNITS TOTAL) BY MOUTH EVERY 7 (SEVEN) DAYS. 6 capsule 0   No current facility-administered medications on file prior to visit.     No Known Allergies  Past Medical History:  Diagnosis Date  . Anemia   . GERD (gastroesophageal reflux disease)   . Hypertension     No past surgical history on file.  Family History  Problem Relation Age of Onset  . Diabetes Father   . Hypertension Sister     Social History   Social History  . Marital status: Married    Spouse name: N/A  . Number of children: N/A  . Years of education: N/A   Occupational History  . Not on file.   Social History Main Topics  . Smoking status: Never Smoker  . Smokeless tobacco: Never Used  . Alcohol use No  . Drug use: No  . Sexual activity: Not Currently   Other Topics Concern  . Not on file   Social History Narrative  . No narrative on file   The PMH, PSH, Social History, Family History, Medications, and allergies have been reviewed in [facility], and have  been updated if relevant.  Review of Systems  Constitutional: Negative.   HENT: Negative.   Eyes: Negative.   Respiratory: Negative.   Cardiovascular: Negative.   Gastrointestinal: Negative.   Endocrine: Negative.   Genitourinary: Negative.   Musculoskeletal: Negative.   Allergic/Immunologic: Negative.   Neurological: Negative.   Hematological: Negative.   Psychiatric/Behavioral: Negative.   All other systems reviewed and are negative.      Objective:    BP 132/80   Pulse 82   Temp 97.4 F (36.3 C)   Ht 4' 10.5" (1.486 m)   Wt 194 lb 12.8 oz (88.4 kg)   SpO2 96%   BMI 40.02 kg/m    Physical Exam  Constitutional: She is oriented to person, place, and time. She appears well-developed and well-nourished. No distress.  HENT:  Head: Normocephalic and atraumatic.  Eyes: Conjunctivae are normal.  Cardiovascular: Normal rate and regular rhythm.   Pulmonary/Chest: Effort normal and breath sounds normal.  Abdominal: Soft. She exhibits no distension and no mass. There is no tenderness. There is no rebound and no guarding.  Neurological: She is alert and oriented to person, place, and time.  Skin: Skin is warm and dry. She is not diaphoretic.  Psychiatric: She has a normal mood and affect. Her behavior is normal. Judgment and thought content normal.  Nursing note  and vitals reviewed.         Assessment & Plan:   Essential hypertension  Gastroesophageal reflux disease, esophagitis presence not specified  Depression, recurrent ([place])  Need for pneumococcal vaccination - Plan: Pneumococcal conjugate vaccine 13-valent No Follow-up on file.

## 2017-02-09 NOTE — Assessment & Plan Note (Signed)
Followed by hematology. Has appt in 07/2017

## 2017-02-09 NOTE — Assessment & Plan Note (Signed)
Well controlled. No changes made to current rxs.  

## 2017-02-09 NOTE — Assessment & Plan Note (Signed)
Symptoms controlled with as needed ppi.

## 2017-02-23 ENCOUNTER — Other Ambulatory Visit: Payer: Self-pay | Admitting: Family Medicine

## 2017-03-13 ENCOUNTER — Other Ambulatory Visit: Payer: Self-pay | Admitting: Family Medicine

## 2017-03-13 NOTE — Telephone Encounter (Signed)
Pt last seen 02/08/2017,  Last lab 11/16/2017  Vit. D--54.10

## 2017-03-21 ENCOUNTER — Telehealth: Payer: Self-pay

## 2017-03-21 NOTE — Telephone Encounter (Signed)
Pt wanted to verify that she has available refills on Vit D and Se-tan plus; spoke with Dejuana at [facility] and pt picked up Se-tam Plus last week and does have available refills and [facility] for pick up today. Pt voiced understanding.

## 2017-04-14 ENCOUNTER — Other Ambulatory Visit: Payer: Self-pay | Admitting: Family Medicine

## 2017-04-14 NOTE — Telephone Encounter (Deleted)
Pt saw Katha Cabal for Part 1 MCW 10/2016. She then cancelled  4 appts with Dr Deborra Medina. Vit D has not been discussed from what I could tell. Does she continue the 50,000 units?

## 2017-08-04 ENCOUNTER — Inpatient Hospital Stay: Payer: Medicare Other | Admitting: Internal Medicine

## 2017-08-04 ENCOUNTER — Inpatient Hospital Stay: Payer: Medicare Other

## 2017-08-10 ENCOUNTER — Ambulatory Visit: Payer: Medicare Other | Admitting: Family Medicine

## 2017-08-14 ENCOUNTER — Encounter: Payer: Self-pay | Admitting: Family Medicine

## 2017-08-14 ENCOUNTER — Ambulatory Visit (INDEPENDENT_AMBULATORY_CARE_PROVIDER_SITE_OTHER): Payer: Medicare Other | Admitting: Family Medicine

## 2017-08-14 VITALS — BP 130/90 | HR 98 | Resp 16 | Ht 59.0 in | Wt 175.0 lb

## 2017-08-14 DIAGNOSIS — I10 Essential (primary) hypertension: Secondary | ICD-10-CM | POA: Diagnosis not present

## 2017-08-14 NOTE — Assessment & Plan Note (Signed)
Normotensive. Reassurance provided. Follow up as needed or in 6 months. The patient indicates understanding of these issues and agrees with the plan.

## 2017-08-14 NOTE — Patient Instructions (Signed)
Great to see you.  Let's keep all your medications the same.  I'll see in 6 months.

## 2017-08-14 NOTE — Progress Notes (Signed)
Subjective:   Patient ID: Melanie Bird, female    DOB: Apr 27, 1941, 76 y.o.   MRN: [id number]  Melanie Bird is a pleasant 76 y.o. year old female who presents to clinic today with Follow-up [facility])  on 08/14/2017  HPI:  HTN- has been well controlled on current dose of metoprolol but was elevated when she was last at the cancer center.  Here to get her blood pressure rechecked today.Marland Kitchen  GERD symptoms have been controlled with prilosec.  She does not take this daily.   Current Outpatient Prescriptions on File Prior to Visit  Medication Sig Dispense Refill  . FeFum-FePo-FA-B Cmp-C-Zn-Mn-Cu (SE-TAN PLUS) 162-115.2-1 MG CAPS TAKE 1 CAPSULE BY MOUTH DAILY. 30 capsule 10  . metoprolol tartrate (LOPRESSOR) 25 MG tablet TAKE 1 TABLET (25 MG TOTAL) BY MOUTH 2 (TWO) TIMES DAILY. 60 tablet 5  . omeprazole (PRILOSEC) 20 MG capsule Take 1 capsule (20 mg total) by mouth daily. 30 capsule 6  . Vitamin D, Ergocalciferol, (DRISDOL) 50000 units CAPS capsule TAKE 1 CAPSULE (50,000 UNITS TOTAL) BY MOUTH EVERY 7 (SEVEN) DAYS. 12 capsule 1   No current facility-administered medications on file prior to visit.     No Known Allergies  Past Medical History:  Diagnosis Date  . Anemia   . GERD (gastroesophageal reflux disease)   . Hypertension     No past surgical history on file.  Family History  Problem Relation Age of Onset  . Diabetes Father   . Hypertension Sister     Social History   Social History  . Marital status: Married    Spouse name: N/A  . Number of children: N/A  . Years of education: N/A   Occupational History  . Not on file.   Social History Main Topics  . Smoking status: Never Smoker  . Smokeless tobacco: Never Used  . Alcohol use No  . Drug use: No  . Sexual activity: Not Currently   Other Topics Concern  . Not on file   Social History Narrative  . No narrative on file   The PMH, PSH, Social History, Family History, Medications, and allergies have been reviewed  in [facility], and have been updated if relevant.  Review of Systems  Constitutional: Negative.   HENT: Negative.   Eyes: Negative.   Respiratory: Negative.   Cardiovascular: Negative.   Gastrointestinal: Negative.   Endocrine: Negative.   Genitourinary: Negative.   Musculoskeletal: Negative.   Allergic/Immunologic: Negative.   Neurological: Negative.   Hematological: Negative.   Psychiatric/Behavioral: Negative.   All other systems reviewed and are negative.      Objective:    BP 130/90   Pulse 98   Resp 16   Ht 4\' 11"  (1.499 m)   Wt 175 lb (79.4 kg)   SpO2 97%   BMI 35.35 kg/m    Physical Exam  Constitutional: She is oriented to person, place, and time. She appears well-developed and well-nourished. No distress.  HENT:  Head: Normocephalic and atraumatic.  Eyes: Conjunctivae are normal.  Cardiovascular: Normal rate and regular rhythm.   Pulmonary/Chest: Effort normal and breath sounds normal.  Abdominal: Soft. She exhibits no distension and no mass. There is no tenderness. There is no rebound and no guarding.  Neurological: She is alert and oriented to person, place, and time.  Skin: Skin is warm and dry. She is not diaphoretic.  Psychiatric: She has a normal mood and affect. Her behavior is normal. Judgment and thought content normal.  Nursing  note and vitals reviewed.         Assessment & Plan:   No diagnosis found. No Follow-up on file.

## 2017-08-29 ENCOUNTER — Other Ambulatory Visit: Payer: Self-pay | Admitting: Family Medicine

## 2017-09-06 ENCOUNTER — Inpatient Hospital Stay: Payer: Medicare Other

## 2017-09-06 ENCOUNTER — Inpatient Hospital Stay: Payer: Medicare Other | Admitting: Internal Medicine

## 2017-09-08 DIAGNOSIS — Z23 Encounter for immunization: Secondary | ICD-10-CM | POA: Diagnosis not present

## 2017-09-22 ENCOUNTER — Inpatient Hospital Stay: Payer: Medicare Other

## 2017-09-22 ENCOUNTER — Inpatient Hospital Stay: Payer: Medicare Other | Attending: Internal Medicine | Admitting: Internal Medicine

## 2017-09-22 DIAGNOSIS — K219 Gastro-esophageal reflux disease without esophagitis: Secondary | ICD-10-CM | POA: Diagnosis not present

## 2017-09-22 DIAGNOSIS — D649 Anemia, unspecified: Secondary | ICD-10-CM | POA: Diagnosis not present

## 2017-09-22 DIAGNOSIS — Z79899 Other long term (current) drug therapy: Secondary | ICD-10-CM | POA: Diagnosis not present

## 2017-09-22 DIAGNOSIS — R238 Other skin changes: Secondary | ICD-10-CM | POA: Diagnosis not present

## 2017-09-22 DIAGNOSIS — I10 Essential (primary) hypertension: Secondary | ICD-10-CM | POA: Diagnosis not present

## 2017-09-22 DIAGNOSIS — D509 Iron deficiency anemia, unspecified: Secondary | ICD-10-CM

## 2017-09-22 DIAGNOSIS — E611 Iron deficiency: Secondary | ICD-10-CM

## 2017-09-22 DIAGNOSIS — R7989 Other specified abnormal findings of blood chemistry: Secondary | ICD-10-CM | POA: Diagnosis not present

## 2017-09-22 LAB — CBC WITH DIFFERENTIAL/PLATELET
BASOS ABS: 0.1 10*3/uL (ref 0–0.1)
BASOS PCT: 1 %
EOS ABS: 0 10*3/uL (ref 0–0.7)
EOS PCT: 0 %
HCT: 44.8 % (ref 35.0–47.0)
Hemoglobin: 15.1 g/dL (ref 12.0–16.0)
Lymphocytes Relative: 10 %
Lymphs Abs: 0.9 10*3/uL — ABNORMAL LOW (ref 1.0–3.6)
MCH: 32 pg (ref 26.0–34.0)
MCHC: 33.8 g/dL (ref 32.0–36.0)
MCV: 94.7 fL (ref 80.0–100.0)
MONO ABS: 0.6 10*3/uL (ref 0.2–0.9)
Monocytes Relative: 6 %
NEUTROS ABS: 8.1 10*3/uL — AB (ref 1.4–6.5)
Neutrophils Relative %: 83 %
PLATELETS: 322 10*3/uL (ref 150–440)
RBC: 4.73 MIL/uL (ref 3.80–5.20)
RDW: 13.7 % (ref 11.5–14.5)
WBC: 9.6 10*3/uL (ref 3.6–11.0)

## 2017-09-22 LAB — COMPREHENSIVE METABOLIC PANEL
ALBUMIN: 4.3 g/dL (ref 3.5–5.0)
ALK PHOS: 87 U/L (ref 38–126)
ALT: 79 U/L — ABNORMAL HIGH (ref 14–54)
ANION GAP: 11 (ref 5–15)
AST: 91 U/L — AB (ref 15–41)
BUN: 11 mg/dL (ref 6–20)
CALCIUM: 10.8 mg/dL — AB (ref 8.9–10.3)
CO2: 24 mmol/L (ref 22–32)
CREATININE: 0.96 mg/dL (ref 0.44–1.00)
Chloride: 106 mmol/L (ref 101–111)
GFR, EST NON AFRICAN AMERICAN: 55 mL/min — AB (ref >60)
Glucose, Bld: 170 mg/dL — ABNORMAL HIGH (ref 65–99)
Potassium: 4.5 mmol/L (ref 3.5–5.1)
SODIUM: 141 mmol/L (ref 135–145)
TOTAL PROTEIN: 7.5 g/dL (ref 6.5–8.1)
Total Bilirubin: 0.7 mg/dL (ref 0.3–1.2)

## 2017-09-22 NOTE — Progress Notes (Signed)
[facility] OFFICE PROGRESS NOTE  Patient Care Team: Lucille Passy, MD as PCP - General Inez Pilgrim Simonne Come, MD as Consulting Physician (Internal Medicine)  Cancer Staging No matching staging information was found for the patient.    No history exists.   # IDA- ? Etiology Chronic Iron PO BID.  #     INTERVAL HISTORY:  Melanie Bird 76 y.o.  female pleasant patient above history of Anemia likely iron deficiency of unclear etiology- is here for follow-up.   Patient has chronic skin papules- for which she follows up with dermatology. She states that this are secondary to insect bites.   Patient continues to be on by mouth iron twice a day. Denies any problems tolerating by mouth iron. No blood in stools. No nausea no vomiting no weight loss. No chest pain or shortness of breath or cough. Patient has chronic arthritis. Complains of difficulty walking because of arthritis- right knee. Otherwise denies any constipation. Denies any dizziness. No falls. Denies any alcohol abuse.  REVIEW OF SYSTEMS:  A complete 10 point review of system is done which is negative except mentioned above/history of present illness.   PAST MEDICAL HISTORY :  Past Medical History:  Diagnosis Date  . Anemia   . GERD (gastroesophageal reflux disease)   . Hypertension     PAST SURGICAL HISTORY :  No past surgical history on file.  FAMILY HISTORY :   Family History  Problem Relation Age of Onset  . Diabetes Father   . Hypertension Sister     SOCIAL HISTORY:   Social History  Substance Use Topics  . Smoking status: Never Smoker  . Smokeless tobacco: Never Used  . Alcohol use No    ALLERGIES:  has No Known Allergies.  MEDICATIONS:  Current Outpatient Prescriptions  Medication Sig Dispense Refill  . FeFum-FePo-FA-B Cmp-C-Zn-Mn-Cu (SE-TAN PLUS) 162-115.2-1 MG CAPS TAKE 1 CAPSULE BY MOUTH DAILY. 30 capsule 10  . metoprolol tartrate (LOPRESSOR) 25 MG tablet TAKE 1 TABLET (25 MG TOTAL)  BY MOUTH 2 (TWO) TIMES DAILY. 60 tablet 5  . omeprazole (PRILOSEC) 20 MG capsule Take 1 capsule (20 mg total) by mouth daily. 30 capsule 6  . Vitamin D, Ergocalciferol, (DRISDOL) 50000 units CAPS capsule TAKE 1 CAPSULE (50,000 UNITS TOTAL) BY MOUTH EVERY 7 (SEVEN) DAYS. 12 capsule 1   No current facility-administered medications for this visit.     PHYSICAL EXAMINATION:  BP (!) 150/83 (BP Location: Left Arm, Patient Position: Sitting)   Pulse (!) 105   Resp (!) 24   Ht 4\' 11"  (1.499 m)   Wt 174 lb 8 oz (79.2 kg)   SpO2 96% Comment: RA  BMI 35.24 kg/m   Filed Weights   09/22/17 1413  Weight: 174 lb 8 oz (79.2 kg)    GENERAL: Well-nourished well-developed; Alert, no distress and comfortable.  Alone. Walks with a cane. EYES: no pallor or icterus OROPHARYNX: no thrush or ulceration; good dentition  NECK: supple, no masses felt LYMPH:  no palpable lymphadenopathy in the cervical, axillary or inguinal regions LUNGS: clear to auscultation and  No wheeze or crackles HEART/CVS: regular rate & rhythm and no murmurs; No lower extremity edema ABDOMEN:abdomen soft, non-tender and normal bowel sounds Musculoskeletal:no cyanosis of digits and no clubbing  PSYCH: alert & oriented x 3 with fluent speech NEURO: no focal motor/sensory deficits SKIN:  Multiple raised papules- in various stages of healing.  LABORATORY DATA:  I have reviewed the data as listed  Component Value Date/Time   NA 141 09/22/2017 1404   K 4.5 09/22/2017 1404   CL 106 09/22/2017 1404   CO2 24 09/22/2017 1404   GLUCOSE 170 (H) 09/22/2017 1404   BUN 11 09/22/2017 1404   CREATININE 0.96 09/22/2017 1404   CALCIUM 10.8 (H) 09/22/2017 1404   PROT 7.5 09/22/2017 1404   ALBUMIN 4.3 09/22/2017 1404   AST 91 (H) 09/22/2017 1404   ALT 79 (H) 09/22/2017 1404   ALKPHOS 87 09/22/2017 1404   BILITOT 0.7 09/22/2017 1404   GFRNONAA 55 (L) 09/22/2017 1404   GFRAA >60 09/22/2017 1404    No results found for: SPEP,  UPEP  Lab Results  Component Value Date   WBC 9.6 09/22/2017   NEUTROABS 8.1 (H) 09/22/2017   HGB 15.1 09/22/2017   HCT 44.8 09/22/2017   MCV 94.7 09/22/2017   PLT 322 09/22/2017      Chemistry      Component Value Date/Time   NA 141 09/22/2017 1404   K 4.5 09/22/2017 1404   CL 106 09/22/2017 1404   CO2 24 09/22/2017 1404   BUN 11 09/22/2017 1404   CREATININE 0.96 09/22/2017 1404      Component Value Date/Time   CALCIUM 10.8 (H) 09/22/2017 1404   ALKPHOS 87 09/22/2017 1404   AST 91 (H) 09/22/2017 1404   ALT 79 (H) 09/22/2017 1404   BILITOT 0.7 09/22/2017 1404       RADIOGRAPHIC STUDIES: I have personally reviewed the radiological images as listed and agreed with the findings in the report. No results found.   ASSESSMENT & PLAN:  Iron deficiency Chronic-unclear etiology question. No obvious GI blood loss. Recommend continued by mouth iron twice a day. Patient's hemoglobin today is 15.  # LFTs elevated- slightly- ? No alcohol. ? Viral. Recommend follow up in 1 month  # Elevated Calcium- 10.8 ? Etiology. Recommend follow up in 1 months.   # skin lesion- papules- multiple/ chronic. She follows up with dermatology.  # Recommend follow-up in one year with labs and studies; repeat CMP- 1 month.   Cc; Dr.Aron, [place].    Orders Placed This Encounter  Procedures  . Comprehensive metabolic panel    Standing Status:   Future    Standing Expiration Date:   03/24/2019  . CBC with Differential    Standing Status:   Future    Standing Expiration Date:   03/24/2019  . Comprehensive metabolic panel    Standing Status:   Future    Standing Expiration Date:   09/22/2018   All questions were answered. The patient knows to call the clinic with any problems, questions or concerns.      Cammie Sickle, MD 09/22/2017 4:22 PM

## 2017-09-22 NOTE — Assessment & Plan Note (Addendum)
Chronic-unclear etiology question. No obvious GI blood loss. Recommend continued by mouth iron twice a day. Patient's hemoglobin today is 15.  # LFTs elevated- slightly- ? No alcohol. ? Viral. Recommend follow up in 1 month  # Elevated Calcium- 10.8 ? Etiology. Recommend follow up in 1 months.   # skin lesion- papules- multiple/ chronic. She follows up with dermatology.  # Recommend follow-up in one year with labs and studies; repeat CMP- 1 month.   Cc; Dr.Aron, [place].

## 2017-09-25 ENCOUNTER — Emergency Department: Payer: Medicare Other

## 2017-09-25 ENCOUNTER — Emergency Department
Admission: EM | Admit: 2017-09-25 | Discharge: 2017-09-25 | Disposition: A | Discharge status: Home / Self Care | Payer: Medicare Other | Attending: Emergency Medicine | Admitting: Emergency Medicine

## 2017-09-25 ENCOUNTER — Encounter: Payer: Self-pay | Admitting: Emergency Medicine

## 2017-09-25 ENCOUNTER — Other Ambulatory Visit: Payer: Self-pay

## 2017-09-25 ENCOUNTER — Telehealth: Payer: Self-pay | Admitting: *Deleted

## 2017-09-25 DIAGNOSIS — W108XXA Fall (on) (from) other stairs and steps, initial encounter: Secondary | ICD-10-CM | POA: Diagnosis not present

## 2017-09-25 DIAGNOSIS — N390 Urinary tract infection, site not specified: Secondary | ICD-10-CM | POA: Diagnosis not present

## 2017-09-25 DIAGNOSIS — S9002XA Contusion of left ankle, initial encounter: Secondary | ICD-10-CM | POA: Insufficient documentation

## 2017-09-25 DIAGNOSIS — Y9301 Activity, walking, marching and hiking: Secondary | ICD-10-CM | POA: Diagnosis not present

## 2017-09-25 DIAGNOSIS — M25572 Pain in left ankle and joints of left foot: Secondary | ICD-10-CM | POA: Diagnosis not present

## 2017-09-25 DIAGNOSIS — Y929 Unspecified place or not applicable: Secondary | ICD-10-CM | POA: Diagnosis not present

## 2017-09-25 DIAGNOSIS — S8992XA Unspecified injury of left lower leg, initial encounter: Secondary | ICD-10-CM | POA: Diagnosis not present

## 2017-09-25 DIAGNOSIS — I10 Essential (primary) hypertension: Secondary | ICD-10-CM | POA: Diagnosis not present

## 2017-09-25 DIAGNOSIS — R7989 Other specified abnormal findings of blood chemistry: Secondary | ICD-10-CM | POA: Diagnosis not present

## 2017-09-25 DIAGNOSIS — Y999 Unspecified external cause status: Secondary | ICD-10-CM | POA: Insufficient documentation

## 2017-09-25 DIAGNOSIS — K219 Gastro-esophageal reflux disease without esophagitis: Secondary | ICD-10-CM | POA: Diagnosis not present

## 2017-09-25 DIAGNOSIS — M79671 Pain in right foot: Secondary | ICD-10-CM | POA: Diagnosis not present

## 2017-09-25 DIAGNOSIS — S99912A Unspecified injury of left ankle, initial encounter: Secondary | ICD-10-CM | POA: Diagnosis not present

## 2017-09-25 DIAGNOSIS — M79605 Pain in left leg: Secondary | ICD-10-CM | POA: Diagnosis not present

## 2017-09-25 DIAGNOSIS — D649 Anemia, unspecified: Secondary | ICD-10-CM | POA: Diagnosis not present

## 2017-09-25 DIAGNOSIS — M25561 Pain in right knee: Secondary | ICD-10-CM | POA: Diagnosis not present

## 2017-09-25 DIAGNOSIS — R238 Other skin changes: Secondary | ICD-10-CM | POA: Diagnosis not present

## 2017-09-25 DIAGNOSIS — S9031XA Contusion of right foot, initial encounter: Secondary | ICD-10-CM | POA: Diagnosis not present

## 2017-09-25 DIAGNOSIS — Z79899 Other long term (current) drug therapy: Secondary | ICD-10-CM | POA: Insufficient documentation

## 2017-09-25 DIAGNOSIS — S99921A Unspecified injury of right foot, initial encounter: Secondary | ICD-10-CM | POA: Diagnosis not present

## 2017-09-25 LAB — URINALYSIS, COMPLETE (UACMP) WITH MICROSCOPIC
BILIRUBIN URINE: NEGATIVE
Glucose, UA: NEGATIVE mg/dL
Hgb urine dipstick: NEGATIVE
Ketones, ur: 20 mg/dL — AB
Nitrite: POSITIVE — AB
PH: 5 (ref 5.0–8.0)
Protein, ur: NEGATIVE mg/dL
SPECIFIC GRAVITY, URINE: 1.021 (ref 1.005–1.030)

## 2017-09-25 MED ORDER — KETOROLAC TROMETHAMINE 60 MG/2ML IM SOLN
5.0000 mg | Freq: Once | INTRAMUSCULAR | Status: AC
  Administered 2017-09-25: 5.1 mg via INTRAMUSCULAR
  Filled 2017-09-25: qty 2

## 2017-09-25 MED ORDER — CEPHALEXIN 500 MG PO CAPS: 500.0000 mg | ORAL_CAPSULE | Freq: Two times a day (BID) | ORAL | 0 refills | Status: DC

## 2017-09-25 MED ORDER — CEPHALEXIN 500 MG PO CAPS
500.0000 mg | ORAL_CAPSULE | Freq: Once | ORAL | Status: AC
  Administered 2017-09-25: 500 mg via ORAL
  Filled 2017-09-25: qty 1

## 2017-09-25 MED ORDER — TRAMADOL HCL 50 MG PO TABS: 50.0000 mg | ORAL_TABLET | Freq: Four times a day (QID) | ORAL | 0 refills | Status: DC | PRN

## 2017-09-25 MED ORDER — TRAMADOL HCL 50 MG PO TABS
50.0000 mg | ORAL_TABLET | Freq: Once | ORAL | Status: AC
  Administered 2017-09-25: 50 mg via ORAL
  Filled 2017-09-25: qty 1

## 2017-09-25 NOTE — Telephone Encounter (Signed)
Melanie Bird called to report that patient fell and injured her foot and hip, her foot is blue. Asking if we would see her. I advised calling PCP and she stated she has not seen her new PCP as of yet so I advised going to an Urgent care facility. She states she will take her to [facility]

## 2017-09-25 NOTE — ED Provider Notes (Signed)
[facility] Emergency Department Provider Note  ____________________________________________  Time seen: Approximately 5:42 PM  I have reviewed the triage vital signs and the nursing notes.   HISTORY  Chief Complaint Fall    HPI Melanie Bird is a 76 y.o. female reports a fall 3 days ago at home. She was walking down the steps out to her mailbox, tripped on the sidewalk and fell onto both knees and feet. She reports pain in the right knee, right foot, left shin, left ankle. She did not hit her head or lose consciousness. He is not having frequent falls. Denies any motor weakness paresthesias or feeling off balance. Over the last 3 days she's had worsening pain in these areas, and has had to decrease her activity due to the pain. The day after her fall, she was seen in hematology clinic, had routine outpatient labs done.  review of the electronic medical record shows these labs to be essentially unremarkable. There is a slightly elevated transaminase, uncertain clinical significance.     Past Medical History:  Diagnosis Date  . Anemia   . GERD (gastroesophageal reflux disease)   . Hypertension      Patient Active Problem List   Diagnosis Date Noted  . Iron deficiency 09/22/2017  . HTN (hypertension) 10/26/2015  . Vitamin D deficiency 10/26/2015  . Iron deficiency anemia 08/26/2010  . Depression, recurrent ([place]) 08/26/2010  . GERD 08/26/2010     History reviewed. No pertinent surgical history.   Prior to Admission medications   Medication Sig Start Date End Date Taking? Authorizing Provider  FeFum-FePo-FA-B Cmp-C-Zn-Mn-Cu (SE-TAN PLUS) 162-115.2-1 MG CAPS TAKE 1 CAPSULE BY MOUTH DAILY. 11/29/16  Yes Lucille Passy, MD  metoprolol tartrate (LOPRESSOR) 25 MG tablet TAKE 1 TABLET (25 MG TOTAL) BY MOUTH 2 (TWO) TIMES DAILY. 08/29/17  Yes Lucille Passy, MD  omeprazole (PRILOSEC) 20 MG capsule Take 1 capsule (20 mg total) by mouth daily. 02/14/12  Yes  Lucille Passy, MD  Vitamin D, Ergocalciferol, (DRISDOL) 50000 units CAPS capsule TAKE 1 CAPSULE (50,000 UNITS TOTAL) BY MOUTH EVERY 7 (SEVEN) DAYS. 04/14/17  Yes Lucille Passy, MD  cephALEXin (KEFLEX) 500 MG capsule Take 1 capsule (500 mg total) by mouth 2 (two) times daily. 09/25/17   Carrie Mew, MD  traMADol (ULTRAM) 50 MG tablet Take 1 tablet (50 mg total) by mouth every 6 (six) hours as needed. 09/25/17   Carrie Mew, MD     Allergies Patient has no known allergies.   Family History  Problem Relation Age of Onset  . Diabetes Father   . Hypertension Sister     Social History Social History  Substance Use Topics  . Smoking status: Never Smoker  . Smokeless tobacco: Never Used  . Alcohol use No    Review of Systems  Constitutional:   No fever or chills.  ENT:   No sore throat. No rhinorrhea. Cardiovascular:   No chest pain or syncope. Respiratory:   No dyspnea or cough. Gastrointestinal:   Negative for abdominal pain, vomiting and diarrhea.  Musculoskeletal:  multiple musculoskeletal complaints as above All other systems reviewed and are negative except as documented above in ROS and HPI.  ____________________________________________   PHYSICAL EXAM:  VITAL SIGNS: ED Triage Vitals  Enc Vitals Group     BP 09/25/17 1530 (!) 159/69     Pulse Rate 09/25/17 1530 81     Resp 09/25/17 1530 16     Temp 09/25/17 1530 97.9 F (  36.6 C)     Temp Source 09/25/17 1530 Oral     SpO2 09/25/17 1530 98 %     Weight 09/25/17 1532 200 lb (90.7 kg)     Height 09/25/17 1532 4\' 11"  (1.499 m)     Head Circumference --      Peak Flow --      Pain Score 09/25/17 1539 10     Pain Loc --      Pain Edu? --      Excl. in [place]? --     Vital signs reviewed, nursing assessments reviewed.   Constitutional:   Alert and oriented. Well appearing and in no distress. Eyes:   No scleral icterus.  EOMI. No nystagmus. No conjunctival pallor. PERRL. ENT   Head:   Normocephalic and  atraumatic.   Nose:   No congestion/rhinnorhea.    Mouth/Throat:   MMM, no pharyngeal erythema. No peritonsillar mass.    Neck:   No meningismus. Full ROM.no midline spinal tenderness Hematological/Lymphatic/Immunilogical:   No cervical lymphadenopathy. Cardiovascular:   RRR. Symmetric bilateral radial and DP pulses.  No murmurs.  Respiratory:   Normal respiratory effort without tachypnea/retractions. Breath sounds are clear and equal bilaterally. No wheezes/rales/rhonchi. Gastrointestinal:   Soft and nontender. Non distended. There is no CVA tenderness.  No rebound, rigidity, or guarding. Genitourinary:   deferred Musculoskeletal:   Normal range of motion in all extremities. No joint effusions.  tenderness in the right prepatellar knee, bruising and swelling in the distal right foot over the MTP joints. Tenderness in the midshaft left fibula without crepitus or deformity. Swelling and ecchymosis about the left lateral ankle. Neurologic:   Normal speech and language.  Motor grossly intact. No gross focal neurologic deficits are appreciated.  Skin:    Skin is warm, dry with areas of bruising as noted above on the lower extremities. There are also multiple areas of small healing wounds about the face with the patient reports are insect bites. She denies any facial trauma at all.  ____________________________________________    LABS (pertinent positives/negatives) (all labs ordered are listed, but only abnormal results are displayed) Labs Reviewed  URINALYSIS, COMPLETE (UACMP) WITH MICROSCOPIC - Abnormal; Notable for the following:       Result Value   Color, Urine AMBER (*)    APPearance HAZY (*)    Ketones, ur 20 (*)    Nitrite POSITIVE (*)    Leukocytes, UA MODERATE (*)    Bacteria, UA MANY (*)    Squamous Epithelial / LPF 0-5 (*)    All other components within normal limits  URINE CULTURE   ____________________________________________   EKG  interpreted by me  Date:  09/25/2017  Rate: 76  Rhythm: normal sinus rhythm  QRS Axis: normal  Intervals: normal  ST/T Wave abnormalities: normal  Conduction Disutrbances: none  Narrative Interpretation: unremarkable      ____________________________________________    RADIOLOGY  Dg Tibia/fibula Left  Result Date: 09/25/2017 CLINICAL DATA:  [place] 3 days ago with left leg pain. EXAM: LEFT TIBIA AND FIBULA - 2 VIEW COMPARISON:  Left ankle 09/25/2017 FINDINGS: Joint space narrowing in the medial knee compartment. Degenerative changes at the patellofemoral compartment. No fracture involving the left tibia or the left fibula. Enthesopathic changes at the insertion of the Achilles tendon. IMPRESSION: No acute bone abnormality to the left lower leg. 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 Electronically Signed   By: Markus Daft M.D.   On: 09/25/2017 18:35   Dg Ankle Complete Left  Result Date: 09/25/2017 CLINICAL DATA:  Pain after fall. EXAM: LEFT ANKLE COMPLETE - 3+ VIEW COMPARISON:  None. FINDINGS: There is no evidence of fracture, dislocation, or joint effusion. There is no evidence of arthropathy or other focal bone abnormality. Soft tissues are unremarkable. IMPRESSION: Negative. Electronically Signed   By: Misty Stanley M.D.   On: 09/25/2017 19:27   Dg Knee Complete 4 Views Right  Result Date: 09/25/2017 CLINICAL DATA:  [place] 3 days ago with right knee pain. EXAM: RIGHT KNEE - COMPLETE 4+ VIEW COMPARISON:  None. FINDINGS: Negative for fracture, dislocation or joint effusion. Alignment of the knee is normal. Mild spurring and degenerative changes in the patellofemoral compartment of the knee. Enthesopathic changes along the superior aspect of the patella. IMPRESSION: No acute bone abnormality to the right knee. Mild degenerative disease. Electronically Signed   By: Markus Daft M.D.   On: 09/25/2017 18:31   Dg Foot Complete Right  Result Date: 09/25/2017 CLINICAL DATA:  [place] 3 days ago.  Pain. EXAM: RIGHT FOOT COMPLETE  - 3+ VIEW COMPARISON:  None. FINDINGS: Bones are diffusely demineralized. No evidence of fracture. No subluxation or dislocation. IMPRESSION: Negative. Electronically Signed   By: Misty Stanley M.D.   On: 09/25/2017 18:31    ____________________________________________   PROCEDURES Procedures  ____________________________________________   INITIAL IMPRESSION / ASSESSMENT AND PLAN / ED COURSE  Pertinent labs & imaging results that were available during my care of the patient were reviewed by me and considered in my medical decision making (see chart for details).  As part of my medical decision making, I reviewed the following data within the [facility] records, lab results, radiology reports and I personally reviewed the following imaging studies: radiograph(s).   patient well appearing no acute distress, presents for evaluation of musculoskeletal pain in bilateral lower extremities after mechanical fall. Suspicion for ACS PE dissection stroke AAA or sepsis. I'll check a urinalysis to evaluate for possible occult UTI causing her to be somewhat weaker. We'll get x-rays of the lower extremities to evaluate for any fractures associated with her areas of pain, tenderness, swelling, or bruising.  Clinical Course as of Sep 25 2056  Mon Sep 25, 2017  1948 Xrays negative for acute injury. Will attempt pain control, trial of ambulation. I anticipate pt can be discharged home to follow up with pcp  [PS]    Clinical Course User Index [PS] Carrie Mew, MD     ----------------------------------------- 8:57 PM on 09/25/2017 -----------------------------------------  Patient able to ambulate without assistive device, but slightly unsteady. We'll provide a walker for support while she feels from his contusions and musculoskeletal pain. Urinalysis does show a clear urinary tract infection, for which I'll prescribe her Keflex. She is not encephalopathic, low  suspicion for pyelonephritis sepsis or other severe complication.  ____________________________________________   FINAL CLINICAL IMPRESSION(S) / ED DIAGNOSES  Final diagnoses:  Lower urinary tract infectious disease  Contusion of right foot, initial encounter  Contusion of left ankle, initial encounter      New Prescriptions   CEPHALEXIN (KEFLEX) 500 MG CAPSULE    Take 1 capsule (500 mg total) by mouth 2 (two) times daily.   TRAMADOL (ULTRAM) 50 MG TABLET    Take 1 tablet (50 mg total) by mouth every 6 (six) hours as needed.     Portions of this note were generated with dragon dictation software. Dictation errors may occur despite best attempts at proofreading.    Carrie Mew, MD 09/25/17  2058  

## 2017-09-25 NOTE — Discharge Instructions (Signed)
Your xrays were unremarkable and don't show any fractures.  Take tramadol as needed for pain.  Use the walker for stability and to relieve pain related to weight-bearing.  Your urine test does show a urinary tract infection, which may have predisposed you to falling. Take Keflex as prescribed to treat this infection.

## 2017-09-25 NOTE — ED Notes (Signed)
[place] , fall

## 2017-09-25 NOTE — ED Triage Notes (Signed)
Pt from [facility] with a fall x3 days , with complaints of bilateral feet pain, left leg pain , pt does not recall the fall.

## 2017-09-27 LAB — URINE CULTURE

## 2017-10-02 ENCOUNTER — Ambulatory Visit (INDEPENDENT_AMBULATORY_CARE_PROVIDER_SITE_OTHER): Payer: Medicare Other | Admitting: Family Medicine

## 2017-10-02 ENCOUNTER — Encounter: Payer: Self-pay | Admitting: Family Medicine

## 2017-10-02 DIAGNOSIS — N3000 Acute cystitis without hematuria: Secondary | ICD-10-CM | POA: Diagnosis not present

## 2017-10-02 DIAGNOSIS — N390 Urinary tract infection, site not specified: Secondary | ICD-10-CM | POA: Insufficient documentation

## 2017-10-02 DIAGNOSIS — W19XXXD Unspecified fall, subsequent encounter: Secondary | ICD-10-CM | POA: Diagnosis not present

## 2017-10-02 DIAGNOSIS — Y92009 Unspecified place in unspecified non-institutional (private) residence as the place of occurrence of the external cause: Secondary | ICD-10-CM

## 2017-10-02 MED ORDER — CEPHALEXIN 500 MG PO CAPS: 500.0000 mg | ORAL_CAPSULE | Freq: Two times a day (BID) | ORAL | 0 refills | Status: DC

## 2017-10-02 MED ORDER — TRAMADOL HCL 50 MG PO TABS: 50.0000 mg | ORAL_TABLET | Freq: Four times a day (QID) | ORAL | 0 refills | Status: DC | PRN

## 2017-10-02 NOTE — Assessment & Plan Note (Signed)
Refill keflex since she did take previous rx appropriately.

## 2017-10-02 NOTE — Assessment & Plan Note (Signed)
She seems to be sore in her legs, thighs, knees. Advised this will take time. Xrays reviewed with pt. She will follow up with me in 2 weeks.

## 2017-10-02 NOTE — Patient Instructions (Signed)
Great to see you.  Please take Keflex as directed- 1 tablet twice daily x 7 days.  Tramadol as needed for severe pain.

## 2017-10-02 NOTE — Progress Notes (Signed)
Subjective:   Patient ID: Melanie Bird, female    DOB: [birth year], 76 y.o.   MRN: [id number]  Melanie Bird is a pleasant 76 y.o. year old female who presents to clinic today with Follow-up (Patient is here today for an ED follow-up from [facility].  She was seen for a fall on 10.05.2018 and UTI.  She states that she was in so much pain that she did not realize that she was to be taking the Abx bid and cannot verbalize when she adjusted from 1qd to bid.  Is requesting the Abx and Tramadol to be refilled.  States that pain is constantly between 9-10 in LLL from knee to ankle and right knee as well.  Pain has remained unchanged.Marland Kitchen)  on 10/02/2017  HPI:  Was seen in the ER on 09/25/17 after she fell 3 days prior at home.  Notes reviewed.  Here with her daughter.  She was walking down the steps out to her mailbox, tripped on the sidewalk and fell onto both knees and feet. She reported pain in the right knee, right foot, left shin, left ankle. She did not hit her head or lose consciousness. She has not been having frequent falls. Denies any motor weakness paresthesias or feeling off balance.  The day after her fall, she was seen in hematology clinic, had routine outpatient labs done.  Dg Tibia/fibula Left  Result Date: 09/25/2017 CLINICAL DATA:  [place] 3 days ago with left leg pain. EXAM: LEFT TIBIA AND FIBULA - 2 VIEW COMPARISON:  Left ankle 09/25/2017 FINDINGS: Joint space narrowing in the medial knee compartment. Degenerative changes at the patellofemoral compartment. No fracture involving the left tibia or the left fibula. Enthesopathic changes at the insertion of the Achilles tendon. IMPRESSION: No acute bone abnormality to the left lower leg. 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 Electronically Signed   By: Markus Daft M.D.   On: 09/25/2017 18:35   Dg Ankle Complete Left  Result Date: 09/25/2017 CLINICAL DATA:  Pain after fall. EXAM: LEFT ANKLE COMPLETE - 3+ VIEW COMPARISON:  None. FINDINGS:  There is no evidence of fracture, dislocation, or joint effusion. There is no evidence of arthropathy or other focal bone abnormality. Soft tissues are unremarkable. IMPRESSION: Negative. Electronically Signed   By: Misty Stanley M.D.   On: 09/25/2017 19:27   Dg Knee Complete 4 Views Right  Result Date: 09/25/2017 CLINICAL DATA:  [place] 3 days ago with right knee pain. EXAM: RIGHT KNEE - COMPLETE 4+ VIEW COMPARISON:  None. FINDINGS: Negative for fracture, dislocation or joint effusion. Alignment of the knee is normal. Mild spurring and degenerative changes in the patellofemoral compartment of the knee. Enthesopathic changes along the superior aspect of the patella. IMPRESSION: No acute bone abnormality to the right knee. Mild degenerative disease. Electronically Signed   By: Markus Daft M.D.   On: 09/25/2017 18:31   Dg Foot Complete Right  Result Date: 09/25/2017 CLINICAL DATA:  [place] 3 days ago.  Pain. EXAM: RIGHT FOOT COMPLETE - 3+ VIEW COMPARISON:  None. FINDINGS: Bones are diffusely demineralized. No evidence of fracture. No subluxation or dislocation. IMPRESSION: Negative. Electronically Signed   By: Misty Stanley M.D.   On: 09/25/2017 18:31    Xrays neg for acute injury.  UA pos for UTI. Urine cx- probable contaminant  D/c'd home on Keflex and Tramadol.  Still in a lot of pain- mainly her LLL.  Asking for tramadol to be refilled.  Also did not take keflex as  directed and asking for a refill of this today.   Current Outpatient Prescriptions on File Prior to Visit  Medication Sig Dispense Refill  . FeFum-FePo-FA-B Cmp-C-Zn-Mn-Cu (SE-TAN PLUS) 162-115.2-1 MG CAPS TAKE 1 CAPSULE BY MOUTH DAILY. 30 capsule 10  . metoprolol tartrate (LOPRESSOR) 25 MG tablet TAKE 1 TABLET (25 MG TOTAL) BY MOUTH 2 (TWO) TIMES DAILY. 60 tablet 5  . omeprazole (PRILOSEC) 20 MG capsule Take 1 capsule (20 mg total) by mouth daily. 30 capsule 6  . Vitamin D, Ergocalciferol, (DRISDOL) 50000 units CAPS capsule TAKE 1  CAPSULE (50,000 UNITS TOTAL) BY MOUTH EVERY 7 (SEVEN) DAYS. 12 capsule 1   No current facility-administered medications on file prior to visit.     No Known Allergies  Past Medical History:  Diagnosis Date  . Anemia   . GERD (gastroesophageal reflux disease)   . Hypertension     No past surgical history on file.  Family History  Problem Relation Age of Onset  . Diabetes Father   . Hypertension Sister     Social History   Social History  . Marital status: Married    Spouse name: N/A  . Number of children: N/A  . Years of education: N/A   Occupational History  . Not on file.   Social History Main Topics  . Smoking status: Never Smoker  . Smokeless tobacco: Never Used  . Alcohol use No  . Drug use: No  . Sexual activity: Not Currently   Other Topics Concern  . Not on file   Social History Narrative  . No narrative on file   The PMH, PSH, Social History, Family History, Medications, and allergies have been reviewed in [facility], and have been updated if relevant.   Review of Systems  Constitutional: Negative.   HENT: Negative.   Eyes: Negative.   Respiratory: Negative.   Cardiovascular: Negative.   Gastrointestinal: Negative.   Endocrine: Negative.   Genitourinary: Negative.   Musculoskeletal: Positive for arthralgias, gait problem and joint swelling.  Skin: Negative.   Allergic/Immunologic: Negative.   Hematological: Negative.   Psychiatric/Behavioral: Negative.   All other systems reviewed and are negative.      Objective:    BP 132/90 (BP Location: Left Arm, Patient Position: Sitting, Cuff Size: Normal)   Pulse 74   Temp 98.1 F (36.7 C) (Oral)   Ht 4\' 11"  (3.614 m)   SpO2 94%    Physical Exam  Constitutional: She is oriented to person, place, and time. She appears well-developed and well-nourished. No distress.  In wheelchair  HENT:  Head: Normocephalic and atraumatic.  Cardiovascular: Normal rate.   Pulmonary/Chest: Effort normal.    Musculoskeletal:       Right knee: She exhibits no swelling and no effusion. Tenderness found.       Left knee: She exhibits normal range of motion and no swelling. Tenderness found.  Neurological: She is alert and oriented to person, place, and time. No cranial nerve deficit.  Skin: Skin is warm and dry. She is not diaphoretic.  Psychiatric: She has a normal mood and affect. Her behavior is normal. Thought content normal.  Nursing note and vitals reviewed.         Assessment & Plan:   Acute cystitis without hematuria  Fall at home, subsequent encounter No Follow-up on file.

## 2017-10-15 ENCOUNTER — Other Ambulatory Visit: Payer: Self-pay | Admitting: Family Medicine

## 2017-10-19 ENCOUNTER — Ambulatory Visit: Payer: Medicare Other | Admitting: Family

## 2017-10-20 ENCOUNTER — Inpatient Hospital Stay: Payer: Medicare Other

## 2017-10-26 ENCOUNTER — Inpatient Hospital Stay: Payer: Medicare Other

## 2017-11-27 ENCOUNTER — Other Ambulatory Visit: Payer: Self-pay | Admitting: Family Medicine

## 2017-12-14 ENCOUNTER — Inpatient Hospital Stay: Payer: Medicare Other | Attending: Internal Medicine

## 2017-12-14 DIAGNOSIS — R7989 Other specified abnormal findings of blood chemistry: Secondary | ICD-10-CM | POA: Insufficient documentation

## 2017-12-14 DIAGNOSIS — R238 Other skin changes: Secondary | ICD-10-CM | POA: Insufficient documentation

## 2017-12-14 DIAGNOSIS — D649 Anemia, unspecified: Secondary | ICD-10-CM | POA: Insufficient documentation

## 2017-12-14 DIAGNOSIS — E611 Iron deficiency: Secondary | ICD-10-CM

## 2017-12-14 DIAGNOSIS — Z79899 Other long term (current) drug therapy: Secondary | ICD-10-CM | POA: Diagnosis not present

## 2017-12-14 DIAGNOSIS — I10 Essential (primary) hypertension: Secondary | ICD-10-CM | POA: Insufficient documentation

## 2017-12-14 DIAGNOSIS — K219 Gastro-esophageal reflux disease without esophagitis: Secondary | ICD-10-CM | POA: Insufficient documentation

## 2017-12-14 LAB — CBC WITH DIFFERENTIAL/PLATELET
Basophils Absolute: 0.1 10*3/uL (ref 0–0.1)
Basophils Relative: 1 %
EOS ABS: 0.1 10*3/uL (ref 0–0.7)
Eosinophils Relative: 1 %
HEMATOCRIT: 48 % — AB (ref 35.0–47.0)
HEMOGLOBIN: 15.9 g/dL (ref 12.0–16.0)
LYMPHS ABS: 1.1 10*3/uL (ref 1.0–3.6)
Lymphocytes Relative: 12 %
MCH: 30.6 pg (ref 26.0–34.0)
MCHC: 33.1 g/dL (ref 32.0–36.0)
MCV: 92.6 fL (ref 80.0–100.0)
MONOS PCT: 9 %
Monocytes Absolute: 0.9 10*3/uL (ref 0.2–0.9)
NEUTROS PCT: 77 %
Neutro Abs: 7.2 10*3/uL — ABNORMAL HIGH (ref 1.4–6.5)
Platelets: 259 10*3/uL (ref 150–440)
RBC: 5.19 MIL/uL (ref 3.80–5.20)
RDW: 12.9 % (ref 11.5–14.5)
WBC: 9.4 10*3/uL (ref 3.6–11.0)

## 2017-12-14 LAB — COMPREHENSIVE METABOLIC PANEL
ALT: 47 U/L (ref 14–54)
AST: 49 U/L — AB (ref 15–41)
Albumin: 3.8 g/dL (ref 3.5–5.0)
Alkaline Phosphatase: 111 U/L (ref 38–126)
Anion gap: 10 (ref 5–15)
BUN: 17 mg/dL (ref 6–20)
CHLORIDE: 105 mmol/L (ref 101–111)
CO2: 23 mmol/L (ref 22–32)
CREATININE: 0.79 mg/dL (ref 0.44–1.00)
Calcium: 10.4 mg/dL — ABNORMAL HIGH (ref 8.9–10.3)
GFR calc non Af Amer: >60 mL/min (ref >60)
Glucose, Bld: 157 mg/dL — ABNORMAL HIGH (ref 65–99)
Potassium: 4 mmol/L (ref 3.5–5.1)
SODIUM: 138 mmol/L (ref 135–145)
Total Bilirubin: 0.5 mg/dL (ref 0.3–1.2)
Total Protein: 7.3 g/dL (ref 6.5–8.1)

## 2017-12-22 ENCOUNTER — Telehealth: Payer: Self-pay | Admitting: Internal Medicine

## 2017-12-22 NOTE — Telephone Encounter (Signed)
Patient does not feel it is necessary to follow up Will call office if any issues emerege [place] recall

## 2017-12-26 ENCOUNTER — Ambulatory Visit: Payer: Medicare Other | Admitting: Family Medicine

## 2018-01-01 ENCOUNTER — Ambulatory Visit: Payer: Medicare Other | Admitting: Family Medicine

## 2018-01-23 ENCOUNTER — Telehealth: Payer: Self-pay | Admitting: Internal Medicine

## 2018-01-23 ENCOUNTER — Telehealth: Payer: Self-pay | Admitting: Family Medicine

## 2018-01-23 NOTE — Telephone Encounter (Signed)
Pt called requesting information related to her metoprolol because she has insurance forms/appliation to fill out; Ronnie contacted at [employer] but pt is unclear of what information is needed; [employer] [phone] is out of office for lunch 1300-1400; will call back at 1400 to see what information is needed.

## 2018-01-23 NOTE — Telephone Encounter (Signed)
Conference call placed with Edison Nasuti at [employer] to verify the information that is needed for her application; Edison Nasuti will call the department to find out what exactly is needed.

## 2018-01-23 NOTE — Telephone Encounter (Signed)
Edison Nasuti from [place] returned call and states that he needs the ICD code for pt's metoprolol; spoke with Judson Roch at [employer] and she confirms that the ICD code is I10; code is given to Edison Nasuti  And he verbalizes understanding; attempt to notify pt at 1432 but no answer at [phone].

## 2018-01-24 ENCOUNTER — Other Ambulatory Visit: Payer: Self-pay | Admitting: Family Medicine

## 2018-01-24 NOTE — Telephone Encounter (Signed)
TA-Pt last test for Vit-Remy Voiles was 54/she sees new provider on 2.15.19/is refill appropriate? Plz advise/thx dmf

## 2018-02-01 ENCOUNTER — Ambulatory Visit: Payer: Medicare Other | Admitting: Family

## 2018-02-02 ENCOUNTER — Ambulatory Visit: Payer: Medicare Other | Admitting: Family

## 2018-03-07 ENCOUNTER — Ambulatory Visit (INDEPENDENT_AMBULATORY_CARE_PROVIDER_SITE_OTHER): Payer: Medicare Other | Admitting: Family

## 2018-03-07 ENCOUNTER — Encounter: Payer: Self-pay | Admitting: Family

## 2018-03-07 VITALS — BP 164/102 | HR 102 | Temp 98.3°F | Resp 20 | Wt 189.0 lb

## 2018-03-07 DIAGNOSIS — F419 Anxiety disorder, unspecified: Secondary | ICD-10-CM

## 2018-03-07 DIAGNOSIS — D509 Iron deficiency anemia, unspecified: Secondary | ICD-10-CM

## 2018-03-07 DIAGNOSIS — E611 Iron deficiency: Secondary | ICD-10-CM | POA: Diagnosis not present

## 2018-03-07 DIAGNOSIS — I10 Essential (primary) hypertension: Secondary | ICD-10-CM

## 2018-03-07 LAB — CBC WITH DIFFERENTIAL/PLATELET
BASOS ABS: 0.1 10*3/uL (ref 0.0–0.1)
Basophils Relative: 0.8 % (ref 0.0–3.0)
Eosinophils Absolute: 0.1 10*3/uL (ref 0.0–0.7)
Eosinophils Relative: 0.6 % (ref 0.0–5.0)
HCT: 44.8 % (ref 36.0–46.0)
Hemoglobin: 15.2 g/dL — ABNORMAL HIGH (ref 12.0–15.0)
LYMPHS ABS: 1.7 10*3/uL (ref 0.7–4.0)
Lymphocytes Relative: 16.6 % (ref 12.0–46.0)
MCHC: 33.9 g/dL (ref 30.0–36.0)
MCV: 93.3 fl (ref 78.0–100.0)
MONO ABS: 0.8 10*3/uL (ref 0.1–1.0)
MONOS PCT: 7.4 % (ref 3.0–12.0)
NEUTROS ABS: 7.9 10*3/uL — AB (ref 1.4–7.7)
NEUTROS PCT: 74.6 % (ref 43.0–77.0)
PLATELETS: 275 10*3/uL (ref 150.0–400.0)
RBC: 4.8 Mil/uL (ref 3.87–5.11)
RDW: 13.5 % (ref 11.5–15.5)
WBC: 10.5 10*3/uL (ref 4.0–10.5)

## 2018-03-07 NOTE — Patient Instructions (Addendum)
I am very concerned your blood pressure is very high today.  You are at increased risk for stroke and heart attack.   Please take your blood pressure medication every day as prescribed.  Please go immediately and take the metoprolol since you have not taken it today. I also want you to purchase a blood pressure cuff and monitor your blood pressure.  Monitor blood pressure,  Goal is less than 130/80; if persistently higher, please make sooner follow up appointment so we can recheck you blood pressure and manage medications  WE need close follow up.  Please call me tomorrow with your blood pressure and also would like to see you in 1 month.  Labs today  Referral to counseling.     Managing Your Hypertension Hypertension is commonly called high blood pressure. This is when the force of your blood pressing against the walls of your arteries is too strong. Arteries are blood vessels that carry blood from your heart throughout your body. Hypertension forces the heart to work harder to pump blood, and may cause the arteries to become narrow or stiff. Having untreated or uncontrolled hypertension can cause heart attack, stroke, kidney disease, and other problems. What are blood pressure readings? A blood pressure reading consists of a higher number over a lower number. Ideally, your blood pressure should be below 120/80. The first ("top") number is called the systolic pressure. It is a measure of the pressure in your arteries as your heart beats. The second ("bottom") number is called the diastolic pressure. It is a measure of the pressure in your arteries as the heart relaxes. What does my blood pressure reading mean? Blood pressure is classified into four stages. Based on your blood pressure reading, your health care provider may use the following stages to determine what type of treatment you need, if any. Systolic pressure and diastolic pressure are measured in a unit called mm Hg. Normal  Systolic  pressure: below 120.  Diastolic pressure: below 80. Elevated  Systolic pressure: 809-983.  Diastolic pressure: below 80. Hypertension stage 1  Systolic pressure: 382-505.  Diastolic pressure: 39-76. Hypertension stage 2  Systolic pressure: 734 or above.  Diastolic pressure: 90 or above. What health risks are associated with hypertension? Managing your hypertension is an important responsibility. Uncontrolled hypertension can lead to:  A heart attack.  A stroke.  A weakened blood vessel (aneurysm).  Heart failure.  Kidney damage.  Eye damage.  Metabolic syndrome.  Memory and concentration problems.  What changes can I make to manage my hypertension? Hypertension can be managed by making lifestyle changes and possibly by taking medicines. Your health care provider will help you make a plan to bring your blood pressure within a normal range. Eating and drinking  Eat a diet that is high in fiber and potassium, and low in salt (sodium), added sugar, and fat. An example eating plan is called the DASH (Dietary Approaches to Stop Hypertension) diet. To eat this way: ? Eat plenty of fresh fruits and vegetables. Try to fill half of your plate at each meal with fruits and vegetables. ? Eat whole grains, such as whole wheat pasta, brown rice, or whole grain bread. Fill about one quarter of your plate with whole grains. ? Eat low-fat diary products. ? Avoid fatty cuts of meat, processed or cured meats, and poultry with skin. Fill about one quarter of your plate with lean proteins such as fish, chicken without skin, beans, eggs, and tofu. ? Avoid premade and processed  foods. These tend to be higher in sodium, added sugar, and fat.  Reduce your daily sodium intake. Most people with hypertension should eat less than 1,500 mg of sodium a day.  Limit alcohol intake to no more than 1 drink a day for nonpregnant women and 2 drinks a day for men. One drink equals 12 oz of beer, 5 oz of  wine, or 1 oz of hard liquor. Lifestyle  Work with your health care provider to maintain a healthy body weight, or to lose weight. Ask what an ideal weight is for you.  Get at least 30 minutes of exercise that causes your heart to beat faster (aerobic exercise) most days of the week. Activities may include walking, swimming, or biking.  Include exercise to strengthen your muscles (resistance exercise), such as weight lifting, as part of your weekly exercise routine. Try to do these types of exercises for 30 minutes at least 3 days a week.  Do not use any products that contain nicotine or tobacco, such as cigarettes and e-cigarettes. If you need help quitting, ask your health care provider.  Control any long-term (chronic) conditions you have, such as high cholesterol or diabetes. Monitoring  Monitor your blood pressure at home as told by your health care provider. Your personal target blood pressure may vary depending on your medical conditions, your age, and other factors.  Have your blood pressure checked regularly, as often as told by your health care provider. Working with your health care provider  Review all the medicines you take with your health care provider because there may be side effects or interactions.  Talk with your health care provider about your diet, exercise habits, and other lifestyle factors that may be contributing to hypertension.  Visit your health care provider regularly. Your health care provider can help you create and adjust your plan for managing hypertension. Will I need medicine to control my blood pressure? Your health care provider may prescribe medicine if lifestyle changes are not enough to get your blood pressure under control, and if:  Your systolic blood pressure is 130 or higher.  Your diastolic blood pressure is 80 or higher.  Take medicines only as told by your health care provider. Follow the directions carefully. Blood pressure medicines  must be taken as prescribed. The medicine does not work as well when you skip doses. Skipping doses also puts you at risk for problems. Contact a health care provider if:  You think you are having a reaction to medicines you have taken.  You have repeated (recurrent) headaches.  You feel dizzy.  You have swelling in your ankles.  You have trouble with your vision. Get help right away if:  You develop a severe headache or confusion.  You have unusual weakness or numbness, or you feel faint.  You have severe pain in your chest or abdomen.  You vomit repeatedly.  You have trouble breathing. Summary  Hypertension is when the force of blood pumping through your arteries is too strong. If this condition is not controlled, it may put you at risk for serious complications.  Your personal target blood pressure may vary depending on your medical conditions, your age, and other factors. For most people, a normal blood pressure is less than 120/80.  Hypertension is managed by lifestyle changes, medicines, or both. Lifestyle changes include weight loss, eating a healthy, low-sodium diet, exercising more, and limiting alcohol. This information is not intended to replace advice given to you by your health care  provider. Make sure you discuss any questions you have with your health care provider. Document Released: 08/29/2012 Document Revised: 11/02/2016 Document Reviewed: 11/02/2016 Elsevier Interactive Patient Education  [employer].

## 2018-03-08 ENCOUNTER — Telehealth: Payer: Self-pay

## 2018-03-08 DIAGNOSIS — I10 Essential (primary) hypertension: Secondary | ICD-10-CM

## 2018-03-08 NOTE — Telephone Encounter (Signed)
Copied from [place] [phone]. Topic: Inquiry >> Mar 08, 2018  1:41 PM Oliver Pila B wrote: Reason for CRM: pt called to state @ 1:30 pt took bp and it is 163/85 contact pt if needed

## 2018-03-08 NOTE — Telephone Encounter (Signed)
Called patient to follow up from office visit  On 03/07/18 .  Patient hasn't purchased blood purchased blood pressure cuff due to [facility] told her it would need to be purchased from medical supply store.   I advised patient that it could be purchased from [facility] or [facility] . Patient stated that she was taking her metoprolol both doses in the am, I advised her that it was supposed to be taken  bid a day 1 in the am and 1 in the pm.  I advised her how important it was to take in her blood pressure readings that elevated blood pressures put her at risk up stroke and heart attack.  She stated that her daughter had her car and that she was unable to go to store to purchase a blood pressure cuff. I recommended that her daughter purchase this for her so she could record her blood pressure readings.   Patient stated that she would try to get her daughter to purchase this for her.   I again reinforced to her that Joycelyn Schmid would like to know how blood pressures were running due to blood pressures were high at last visit.  She will call back tomorrow with readings if she is able to purchase a blood pressure cuff.

## 2018-03-08 NOTE — Telephone Encounter (Signed)
-----   Message from Burnard Hawthorne, [place] sent at 03/07/2018  3:18 PM EDT ----- Call pt We are concerned about her elevated blood pressure yesterday.  Please call and ensure that she is taking her metoprolol twice per day as prescribed.  She was supposed to purchase a blood pressure cuff. What are her readings? I advised a 1 month follow-up however her blood pressure is still unruly, she is to be seen earlier.like one week or two.

## 2018-03-09 DIAGNOSIS — F419 Anxiety disorder, unspecified: Secondary | ICD-10-CM | POA: Insufficient documentation

## 2018-03-09 LAB — PTH, INTACT AND CALCIUM
CALCIUM: 10.2 mg/dL (ref 8.6–10.4)
PTH: 72 pg/mL — ABNORMAL HIGH (ref 14–64)

## 2018-03-09 MED ORDER — AMLODIPINE BESYLATE 2.5 MG PO TABS: 2.5000 mg | ORAL_TABLET | Freq: Every day | ORAL | 3 refills | Status: DC

## 2018-03-09 NOTE — Telephone Encounter (Signed)
Please call patient As we discussed this morning, I will send a low dose of amlodipine 2.5 mg or patient to add on the blood pressure regimen if blood pressures are not becoming less than 130/80.  Please ensure patient has a follow-up with me in the next couple of weeks

## 2018-03-09 NOTE — Telephone Encounter (Signed)
Patient advised of below and verbalized understanding.  

## 2018-03-09 NOTE — Progress Notes (Signed)
Subjective:    Patient ID: Melanie Bird, female    DOB: 08-26-1941, 77 y.o.   MRN: [id number]  CC: Melanie Bird is a 77 y.o. female who presents today for follow up.   HPI: HTN- wasn't sure if she was on was toprol; thinks they are two white pills. Hasn't taken them today.  Notes on the way over here she got loss of [employer] and nice person helped her to get here.    She feels anxious from getting loss and also notes a lot of stress in her life right now. Has had a lot of stress with neighbor who is affecting her daughter. she does not have blood pressure cuff at home.   Denies confusion, exertional chest pain or pressure, numbness or tingling radiating to left arm or jaw, palpitations, dizziness, frequent headaches, changes in vision, or shortness of breath.   Had been taking tramadol when hurt right leg after fall. Hasn't needed them since.   Not taking prilosec.    Anemia- chronic, etiology unclear. Dr Rogue Bussing; last seen 09/2017; has been on iron BID. He noted elevated calcium.   Due for mammogram . She is also due for colonscopy;  I remphasized the importance of colonoscopy for screening especially in the setting of anemia; declines both of these screenings today.   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 Anemia   . GERD (gastroesophageal reflux disease)   . Hypertension    No past surgical history on file. Family History  Problem Relation Age of Onset  . Diabetes Father   . Hypertension Sister     Allergies: Patient has no known allergies. Current Outpatient Medications on File Prior to Visit  Medication Sig Dispense Refill  . FeFum-FePo-FA-B Cmp-C-Zn-Mn-Cu (SE-TAN PLUS) 162-115.2-1 MG CAPS TAKE 1 CAPSULE BY MOUTH DAILY. 30 capsule 2  . metoprolol tartrate (LOPRESSOR) 25 MG tablet TAKE 1 TABLET (25 MG TOTAL) BY MOUTH 2 (TWO) TIMES DAILY. 60 tablet 5  . Vitamin D,  Ergocalciferol, (DRISDOL) 50000 units CAPS capsule TAKE 1 CAPSULE (50,000 UNITS TOTAL) BY MOUTH EVERY 7 (SEVEN) DAYS. 12 capsule 0   No current facility-administered medications on file prior to visit.     Social History   Tobacco Use  . Smoking status: Never Smoker  . Smokeless tobacco: Never Used  Substance Use Topics  . Alcohol use: No  . Drug use: No    Review of Systems  Constitutional: Negative for chills and fever.  Respiratory: Negative for cough.   Cardiovascular: Negative for chest pain and palpitations.  Gastrointestinal: Negative for nausea and vomiting.  Psychiatric/Behavioral: Negative for sleep disturbance and suicidal ideas. The patient is nervous/anxious.       Objective:    BP (!) 164/102 (BP Location: Left Arm, Patient Position: Sitting, Cuff Size: Large)   Pulse (!) 102   Temp 98.3 F (36.8 C) (Oral)   Resp 20   Wt 189 lb (85.7 kg)   SpO2 97%   BMI 38.17 kg/m  BP Readings from Last 3 Encounters:  03/07/18 (!) 164/102  10/02/17 132/90  09/25/17 (!) 150/84   Wt Readings from Last 3 Encounters:  03/07/18 189 lb (85.7 kg)  09/25/17 200 lb (90.7 kg)  09/22/17 174 lb 8 oz (79.2 kg)    Physical Exam  Constitutional: She appears well-developed and well-nourished.  HENT:  Mouth/Throat: Uvula is midline, oropharynx  is clear and moist and mucous membranes are normal.  Eyes: Pupils are equal, round, and reactive to light. Conjunctivae and EOM are normal.  Fundus normal bilaterally.   Cardiovascular: Normal rate, regular rhythm, normal heart sounds and normal pulses.  Pulmonary/Chest: Effort normal and breath sounds normal. She has no wheezes. She has no rhonchi. She has no rales.  Neurological: She is alert. She has normal strength. No cranial nerve deficit or sensory deficit. She displays a negative Romberg sign.  Reflex Scores:      Bicep reflexes are 2+ on the right side and 2+ on the left side.      Patellar reflexes are 2+ on the right side and 2+  on the left side. Grip equal and strong bilateral upper extremities. Gait strong and steady. Able to perform rapid alternating movement without difficulty.   Skin: Skin is warm and dry.  Psychiatric: She has a normal mood and affect. Her speech is normal and behavior is normal. Thought content normal.  Vitals reviewed.      Assessment & Plan:   Problem List Items Addressed This Visit      Cardiovascular and Mediastinum   HTN (hypertension) - Primary    Elevated today.  Reassured by normal neurologic exam.  No signs or symptoms of hypertensive urgency or emergency at this time.  Patient will take blood pressure medication when she gets home today as she has not taken it yet today. She will purchase BP cuff and monitor. I suspect getting lost on way here, anxiety contributory.  Advised very close follow-up and if any signs or symptoms to support heart attack, stroke, patient understands to call 911.  Close follow-up.      Relevant Orders   DME Other see comment     Other   Iron deficiency anemia   Relevant Orders   CBC with Differential/Platelet (Completed)   Iron deficiency    Pending CBC.  Discussed with patient that I would advise colonoscopy especially in the context of any anemia.  She politely declines today.      Anxiety    Referral to counseling.  We will continue to discuss at follow-up.      Relevant Orders   Ambulatory referral to Psychology    Other Visit Diagnoses    Serum calcium elevated       Relevant Orders   PTH-related peptide   PTH, intact and calcium   Vitamin D 1,25 dihydroxy       I have discontinued Paralee Cancel. Blatter's omeprazole, cephALEXin, and traMADol. I am also having her maintain her metoprolol tartrate, SE-TAN PLUS, and Vitamin D (Ergocalciferol).   No orders of the defined types were placed in this encounter.   Return precautions given.   Risks, benefits, and alternatives of the medications and treatment plan prescribed today were  discussed, and patient expressed understanding.   Education regarding symptom management and diagnosis given to patient on AVS.  Continue to follow with Burnard Hawthorne, FNP for routine health maintenance.   Marjory Lies and I agreed with plan.   Mable Paris, FNP

## 2018-03-09 NOTE — Assessment & Plan Note (Signed)
Elevated today.  Reassured by normal neurologic exam.  No signs or symptoms of hypertensive urgency or emergency at this time.  Patient will take blood pressure medication when she gets home today as she has not taken it yet today. She will purchase BP cuff and monitor. I suspect getting lost on way here, anxiety contributory.  Advised very close follow-up and if any signs or symptoms to support heart attack, stroke, patient understands to call 911.  Close follow-up.

## 2018-03-09 NOTE — Assessment & Plan Note (Signed)
Referral to counseling.  We will continue to discuss at follow-up.

## 2018-03-09 NOTE — Addendum Note (Signed)
Addended by: Burnard Hawthorne on: 03/09/2018 08:08 AM   Modules accepted: Orders

## 2018-03-09 NOTE — Assessment & Plan Note (Signed)
Pending CBC.  Discussed with patient that I would advise colonoscopy especially in the context of any anemia.  She politely declines today.

## 2018-03-09 NOTE — Telephone Encounter (Signed)
Patient advised of below and verbalized an understanding  

## 2018-03-12 LAB — VITAMIN D 1,25 DIHYDROXY
Vitamin D 1, 25 (OH)2 Total: 78 pg/mL — ABNORMAL HIGH (ref 18–72)
Vitamin D2 1, 25 (OH)2: 44 pg/mL
Vitamin D3 1, 25 (OH)2: 34 pg/mL

## 2018-03-12 NOTE — Telephone Encounter (Signed)
Relation to pt: self Call back number: [phone]   Reason for call:  Patient states BP is coming down since being on amLODipine (NORVASC) 2.5 MG tablet and would like to know PCP recommendation regarding what foods she should be eating. Patient would like PCP recommendation composed in a letter for patient review and mailed to home, patient would like a call to discuss, please advise    [place]  [place] [phone]

## 2018-03-12 NOTE — Telephone Encounter (Signed)
Low fat low sodium foods right?

## 2018-03-12 NOTE — Telephone Encounter (Signed)
Call pt and please mail the below Info on [place] for HTN Would she like to see nutritionist?     [place] DASH stands for "Dietary Approaches to Stop Hypertension." The DASH eating plan is a healthy eating plan that has been shown to reduce high blood pressure (hypertension). It may also reduce your risk for type 2 diabetes, heart disease, and stroke. The DASH eating plan may also help with weight loss. What are tips for following this plan? General guidelines  Avoid eating more than 2,300 mg (milligrams) of salt (sodium) a day. If you have hypertension, you may need to reduce your sodium intake to 1,500 mg a day.  Limit alcohol intake to no more than 1 drink a day for nonpregnant women and 2 drinks a day for men. One drink equals 12 oz of beer, 5 oz of wine, or 1 oz of hard liquor.  Work with your health care provider to maintain a healthy body weight or to lose weight. Ask what an ideal weight is for you.  Get at least 30 minutes of exercise that causes your heart to beat faster (aerobic exercise) most days of the week. Activities may include walking, swimming, or biking.  Work with your health care provider or diet and nutrition specialist (dietitian) to adjust your eating plan to your individual calorie needs. Reading food labels  Check food labels for the amount of sodium per serving. Choose foods with less than 5 percent of the Daily Value of sodium. Generally, foods with less than 300 mg of sodium per serving fit into this eating plan.  To find whole grains, look for the word "whole" as the first word in the ingredient list. Shopping  Buy products labeled as "low-sodium" or "no salt added."  Buy fresh foods. Avoid canned foods and premade or frozen meals. Cooking  Avoid adding salt when cooking. Use salt-free seasonings or herbs instead of table salt or sea salt. Check with your health care provider or pharmacist before using salt substitutes.  Do not fry foods.  Cook foods using healthy methods such as baking, boiling, grilling, and broiling instead.  Cook with heart-healthy oils, such as olive, canola, soybean, or sunflower oil. Meal planning   Eat a balanced diet that includes: ? 5 or more servings of fruits and vegetables each day. At each meal, try to fill half of your plate with fruits and vegetables. ? Up to 6-8 servings of whole grains each day. ? Less than 6 oz of lean meat, poultry, or fish each day. A 3-oz serving of meat is about the same size as a deck of cards. One egg equals 1 oz. ? 2 servings of low-fat dairy each day. ? A serving of nuts, seeds, or beans 5 times each week. ? Heart-healthy fats. Healthy fats called Omega-3 fatty acids are found in foods such as flaxseeds and coldwater fish, like sardines, salmon, and mackerel.  Limit how much you eat of the following: ? Canned or prepackaged foods. ? Food that is high in trans fat, such as fried foods. ? Food that is high in saturated fat, such as fatty meat. ? Sweets, desserts, sugary drinks, and other foods with added sugar. ? Full-fat dairy products.  Do not salt foods before eating.  Try to eat at least 2 vegetarian meals each week.  Eat more home-cooked food and less restaurant, buffet, and fast food.  When eating at a restaurant, ask that your food be prepared with less salt or  no salt, if possible. What foods are recommended? The items listed may not be a complete list. Talk with your dietitian about what dietary choices are best for you. Grains Whole-grain or whole-wheat bread. Whole-grain or whole-wheat pasta. Brown rice. [place]. Bulgur. Whole-grain and low-sodium cereals. Pita bread. Low-fat, low-sodium crackers. Whole-wheat flour tortillas. Vegetables Fresh or frozen vegetables (raw, steamed, roasted, or grilled). Low-sodium or reduced-sodium tomato and vegetable juice. Low-sodium or reduced-sodium tomato sauce and tomato paste. Low-sodium or reduced-sodium  canned vegetables. Fruits All fresh, dried, or frozen fruit. Canned fruit in natural juice (without added sugar). Meat and other protein foods Skinless chicken or [place]. Ground chicken or [place]. Pork with fat trimmed off. Fish and seafood. Egg whites. Dried beans, peas, or lentils. Unsalted nuts, nut butters, and seeds. Unsalted canned beans. Lean cuts of beef with fat trimmed off. Low-sodium, lean deli meat. Dairy Low-fat (1%) or fat-free (skim) milk. Fat-free, low-fat, or reduced-fat cheeses. Nonfat, low-sodium ricotta or cottage cheese. Low-fat or nonfat yogurt. Low-fat, low-sodium cheese. Fats and oils Soft margarine without trans fats. Vegetable oil. Low-fat, reduced-fat, or light mayonnaise and salad dressings (reduced-sodium). Canola, safflower, olive, soybean, and sunflower oils. Avocado. Seasoning and other foods Herbs. Spices. Seasoning mixes without salt. Unsalted popcorn and pretzels. Fat-free sweets. What foods are not recommended? The items listed may not be a complete list. Talk with your dietitian about what dietary choices are best for you. Grains Baked goods made with fat, such as croissants, muffins, or some breads. Dry pasta or rice meal packs. Vegetables Creamed or fried vegetables. Vegetables in a cheese sauce. Regular canned vegetables (not low-sodium or reduced-sodium). Regular canned tomato sauce and paste (not low-sodium or reduced-sodium). Regular tomato and vegetable juice (not low-sodium or reduced-sodium). Angie Fava. Olives. Fruits Canned fruit in a light or heavy syrup. Fried fruit. Fruit in cream or butter sauce. Meat and other protein foods Fatty cuts of meat. Ribs. Fried meat. Berniece Salines. Sausage. Bologna and other processed lunch meats. Salami. Fatback. Hotdogs. Bratwurst. Salted nuts and seeds. Canned beans with added salt. Canned or smoked fish. Whole eggs or egg yolks. Chicken or [place] with skin. Dairy Whole or 2% milk, cream, and half-and-half. Whole or  full-fat cream cheese. Whole-fat or sweetened yogurt. Full-fat cheese. Nondairy creamers. Whipped toppings. Processed cheese and cheese spreads. Fats and oils Butter. Stick margarine. Lard. Shortening. Ghee. Bacon fat. Tropical oils, such as coconut, palm kernel, or palm oil. Seasoning and other foods Salted popcorn and pretzels. Onion salt, garlic salt, seasoned salt, table salt, and sea salt. Worcestershire sauce. Tartar sauce. Barbecue sauce. Teriyaki sauce. Soy sauce, including reduced-sodium. Steak sauce. Canned and packaged gravies. Fish sauce. Oyster sauce. Cocktail sauce. Horseradish that you find on the shelf. Ketchup. Mustard. Meat flavorings and tenderizers. Bouillon cubes. Hot sauce and Tabasco sauce. Premade or packaged marinades. Premade or packaged taco seasonings. Relishes. Regular salad dressings. Where to find more information:  National Heart, Lung, and [place]: https://wilson-eaton.com/  American Heart Association: www.heart.org Summary  The DASH eating plan is a healthy eating plan that has been shown to reduce high blood pressure (hypertension). It may also reduce your risk for type 2 diabetes, heart disease, and stroke.  With the DASH eating plan, you should limit salt (sodium) intake to 2,300 mg a day. If you have hypertension, you may need to reduce your sodium intake to 1,500 mg a day.  When on the DASH eating plan, aim to eat more fresh fruits and vegetables, whole grains, lean proteins, low-fat dairy, and  heart-healthy fats.  Work with your health care provider or diet and nutrition specialist (dietitian) to adjust your eating plan to your individual calorie needs. This information is not intended to replace advice given to you by your health care provider. Make sure you discuss any questions you have with your health care provider. Document Released: 11/24/2011 Document Revised: 11/28/2016 Document Reviewed: 11/28/2016 Elsevier Interactive Patient Education  [employer].

## 2018-03-12 NOTE — Telephone Encounter (Signed)
Please advise 

## 2018-03-12 NOTE — Telephone Encounter (Signed)
Called patient and she stated she does not want to see a nutritionist at this time. Diet mailed.

## 2018-03-14 ENCOUNTER — Other Ambulatory Visit: Payer: Self-pay | Admitting: Family

## 2018-03-18 LAB — PTH-RELATED PEPTIDE: PTH-Related Protein (PTH-RP): 11 pg/mL — ABNORMAL LOW (ref 14–27)

## 2018-03-26 ENCOUNTER — Ambulatory Visit: Payer: Medicare (Managed Care) | Admitting: Family

## 2018-03-28 ENCOUNTER — Other Ambulatory Visit: Payer: Self-pay | Admitting: Family Medicine

## 2018-03-28 ENCOUNTER — Telehealth: Payer: Self-pay | Admitting: Family

## 2018-03-28 ENCOUNTER — Encounter: Payer: Self-pay | Admitting: Family

## 2018-03-28 DIAGNOSIS — Z1239 Encounter for other screening for malignant neoplasm of breast: Secondary | ICD-10-CM

## 2018-03-28 NOTE — Telephone Encounter (Signed)
Call pt  She is due for mammogram Will she still do an annual mammogram?  I have ordered. Would you schedule an appt for her.

## 2018-03-28 NOTE — Telephone Encounter (Signed)
close

## 2018-04-02 ENCOUNTER — Other Ambulatory Visit: Payer: Self-pay | Admitting: Family Medicine

## 2018-04-02 ENCOUNTER — Telehealth: Payer: Self-pay

## 2018-04-02 DIAGNOSIS — D509 Iron deficiency anemia, unspecified: Secondary | ICD-10-CM

## 2018-04-02 NOTE — Telephone Encounter (Signed)
Patient calling requesting number for ENT , she cancelled appointment with them.   When I gave her lab results she requested the number.

## 2018-04-02 NOTE — Telephone Encounter (Signed)
noted 

## 2018-04-02 NOTE — Telephone Encounter (Signed)
Called patient advised her that she is due for mammogram she declined to schedule at this time , she states she has too much going on .   Wants to hold off at this time.  I explained the importance of having done.  She still declined.

## 2018-04-03 NOTE — Telephone Encounter (Signed)
No answer. lvm with al ent ph #

## 2018-04-04 NOTE — Telephone Encounter (Signed)
Melanie Bird,   Where are we on her ref to ENT? Did she refuse?    [name],  I refilled her Se Tan plus - this is a vitamin that I am not familiar with though I presume she is on for h/o iron deficient anemia from what I can tell from chart . I have pended iron, folate, and b12 studies - please have make a lab appt

## 2018-04-05 ENCOUNTER — Telehealth: Payer: Self-pay | Admitting: Internal Medicine

## 2018-04-05 NOTE — Telephone Encounter (Signed)
Spoke to patient's PCP regarding mildly elevated calcium 10.8; parathyroid related hormone low; intact PTH elevated-I suspect primary hyperparathyroidism; not likely related to malignancy.  Patient is awaiting ENT evaluation.  Follow up with me as planned.

## 2018-04-09 ENCOUNTER — Ambulatory Visit: Payer: Medicare (Managed Care) | Admitting: Family

## 2018-04-13 ENCOUNTER — Telehealth: Payer: Self-pay | Admitting: Family

## 2018-04-13 NOTE — Telephone Encounter (Signed)
Call pt  Has she scheduled an appt with ENT Dr Richardson Landry?

## 2018-04-13 NOTE — Telephone Encounter (Signed)
Spoke with patient she has appointment in May with Dr Richardson Landry ENT

## 2018-04-16 ENCOUNTER — Other Ambulatory Visit: Payer: Self-pay | Admitting: Family Medicine

## 2018-04-17 ENCOUNTER — Telehealth: Payer: Self-pay | Admitting: Family

## 2018-04-17 NOTE — Telephone Encounter (Signed)
Copied from [place] [id number]. Topic: Quick Communication - Rx Refill/Question >> Apr 17, 2018 12:48 PM Aurelio Brash B wrote: Medication: amLODipine (NORVASC) 2.5 MG tablet    Has the patient contacted their pharmacy? Yes   (Agent: If no, request that the patient contact the pharmacy for the refill.)  Preferred Pharmacy (with phone number or street name)[id number] - [place], [place] [address] [phone] (Phone) [phone] (Fax)      Agent: Please be advised that RX refills may take up to 3 business days. We ask that you follow-up with your pharmacy.

## 2018-04-17 NOTE — Telephone Encounter (Signed)
Pt was given a 90 day supply of medication on 03/09/18 and pharmacy called to confirm the amount that was dispensed.   Pt called and advised that it was too early for a refill of the medication. Pt states she received a call from the pharmacy saying that her prescription was ready for pick up but when she went to the pharmacy she was told that it was too early for a refill on the requested medication. While on the phone with triage nurse pt checked her medication bottle and pt still had pills available. Advised pt to contact pharmacy for further refills when the current bottle of medication has about 5 pills left in it. Pt notified that she should not need a refill until 05/2018. Pt verbalized understanding.

## 2018-04-17 NOTE — Telephone Encounter (Signed)
Tried calling patient --no answer

## 2018-04-19 NOTE — Telephone Encounter (Signed)
Lab appointment scheduled 

## 2018-04-20 NOTE — Telephone Encounter (Signed)
Call pt  I have refused her vit d refill. Her vitamin D is high and she is awaiting appt with ENT in May. Advise to ask Dr Richardson Landry if she needs to supplement her vitamin D.

## 2018-04-25 ENCOUNTER — Other Ambulatory Visit (INDEPENDENT_AMBULATORY_CARE_PROVIDER_SITE_OTHER): Payer: Medicare Other

## 2018-04-25 DIAGNOSIS — D509 Iron deficiency anemia, unspecified: Secondary | ICD-10-CM | POA: Diagnosis not present

## 2018-04-25 LAB — FERRITIN: Ferritin: 100 ng/mL (ref 10.0–291.0)

## 2018-04-25 LAB — IBC PANEL
Iron: 261 ug/dL — ABNORMAL HIGH (ref 42–145)
SATURATION RATIOS: 67.1 % — AB (ref 20.0–50.0)
Transferrin: 278 mg/dL (ref 212.0–360.0)

## 2018-04-25 LAB — B12 AND FOLATE PANEL
Folate: >24.1 ng/mL (ref >5.9)
VITAMIN B 12: 1122 pg/mL — AB (ref 211–911)

## 2018-04-27 ENCOUNTER — Other Ambulatory Visit: Payer: Self-pay | Admitting: Family

## 2018-04-27 NOTE — Telephone Encounter (Signed)
Patient advised of below and verbalized understanding.  

## 2018-05-11 ENCOUNTER — Telehealth: Payer: Self-pay | Admitting: Family

## 2018-05-11 NOTE — Telephone Encounter (Signed)
Call pt  I know she saw ENT Dr Richardson Landry  He would like to see endocrine  Did he place a referral for her or do I need to do that?

## 2018-05-11 NOTE — Telephone Encounter (Signed)
Spoke with patient , Dr Richardson Landry made referral to [facility]. She will contact Dr Richardson Landry office in regards to referral

## 2018-05-15 ENCOUNTER — Telehealth: Payer: Self-pay

## 2018-05-15 NOTE — Telephone Encounter (Signed)
FYI

## 2018-05-15 NOTE — Telephone Encounter (Signed)
Copied from [place] [id number]. Topic: Inquiry >> May 15, 2018 12:36 PM Conception Chancy, NT wrote: Reason for CRM: patient is calling and states that she has a appt with her thyroid doctor, Dr. Gabriel Carina, on July 5th at 10:00am. She states she promised Arnett she would let her know.

## 2018-05-16 NOTE — Telephone Encounter (Signed)
Noted! Thank you

## 2018-06-06 ENCOUNTER — Other Ambulatory Visit: Payer: Medicare Other

## 2018-06-13 ENCOUNTER — Ambulatory Visit (INDEPENDENT_AMBULATORY_CARE_PROVIDER_SITE_OTHER): Payer: Medicare Other | Admitting: Family

## 2018-06-13 ENCOUNTER — Encounter: Payer: Self-pay | Admitting: Family

## 2018-06-13 VITALS — BP 120/90 | HR 77 | Temp 98.0°F | Resp 16 | Wt 188.4 lb

## 2018-06-13 DIAGNOSIS — F419 Anxiety disorder, unspecified: Secondary | ICD-10-CM | POA: Diagnosis not present

## 2018-06-13 DIAGNOSIS — G4733 Obstructive sleep apnea (adult) (pediatric): Secondary | ICD-10-CM | POA: Diagnosis not present

## 2018-06-13 DIAGNOSIS — D509 Iron deficiency anemia, unspecified: Secondary | ICD-10-CM

## 2018-06-13 DIAGNOSIS — I10 Essential (primary) hypertension: Secondary | ICD-10-CM

## 2018-06-13 DIAGNOSIS — E210 Primary hyperparathyroidism: Secondary | ICD-10-CM | POA: Insufficient documentation

## 2018-06-13 NOTE — Assessment & Plan Note (Signed)
Has seen ENT, Dr Richardson Landry. Awaiting on consult with endocrine next month. Will follow

## 2018-06-13 NOTE — Assessment & Plan Note (Signed)
Reasonably controlled today.  Based on her age, is reasonable to have a goal less than 150/80. We will continue to monitor; I  did not make any changes today.

## 2018-06-13 NOTE — Assessment & Plan Note (Signed)
Resolved at this time. Will follow

## 2018-06-13 NOTE — Assessment & Plan Note (Signed)
Compliant with cipap.  

## 2018-06-13 NOTE — Progress Notes (Signed)
Subjective:    Patient ID: Melanie Bird, female    DOB: [birth year], 77 y.o.   MRN: [id number]  CC: Melanie Bird is a 77 y.o. female who presents today for follow up.   HPI: Anemia- had been following with hematology, Dr B, whom advised reasonable to stop iron. No alcohol use.  Elevated calcium-  Has seen ENT Dr Richardson Landry; pending an appt 7/5 with endocrine.   Anxiety has improved since unwanted neighbor stopped coming. No SI/HI.   HTN- checks at home however cant recall blood pressure. Compliant with blood pressure medicatoin Denies exertional chest pain or pressure, numbness or tingling radiating to left arm or jaw, palpitations, dizziness, frequent headaches, changes in vision, or shortness of breath.   OSA- wears cipap.  GERD- takes prn prilosec with relief. Triggered by food or how 'late I eat.'     due mammogram, bone density- we scheduled for patient today  HISTORY:  Past Medical History:  Diagnosis Date  . Anemia   . GERD (gastroesophageal reflux disease)   . Hypertension    History reviewed. No pertinent surgical history. Family History  Problem Relation Age of Onset  . Diabetes Father   . Hypertension Sister     Allergies: Patient has no known allergies. Current Outpatient Medications on File Prior to Visit  Medication Sig Dispense Refill  . amLODipine (NORVASC) 2.5 MG tablet Take 1 tablet (2.5 mg total) by mouth daily. 90 tablet 3  . metoprolol tartrate (LOPRESSOR) 25 MG tablet TAKE 1 TABLET (25 MG TOTAL) BY MOUTH 2 (TWO) TIMES DAILY. 60 tablet 5   No current facility-administered medications on file prior to visit.     Social History   Tobacco Use  . Smoking status: Never Smoker  . Smokeless tobacco: Never Used  Substance Use Topics  . Alcohol use: No  . Drug use: No    Review of Systems  Constitutional: Negative for chills and fever.  Respiratory: Negative for cough.   Cardiovascular: Negative for chest pain and palpitations.    Gastrointestinal: Negative for nausea and vomiting.  Psychiatric/Behavioral: The patient is not nervous/anxious (improved).       Objective:    BP 120/90   Pulse 77   Temp 98 F (36.7 C) (Oral)   Resp 16   Wt 188 lb 6 oz (85.4 kg)   SpO2 98%   BMI 38.05 kg/m  BP Readings from Last 3 Encounters:  06/13/18 120/90  03/07/18 (!) 164/102  10/02/17 132/90   Wt Readings from Last 3 Encounters:  06/13/18 188 lb 6 oz (85.4 kg)  03/07/18 189 lb (85.7 kg)  09/25/17 200 lb (90.7 kg)    Physical Exam  Constitutional: She appears well-developed and well-nourished.  Eyes: Conjunctivae are normal.  Cardiovascular: Normal rate, regular rhythm, normal heart sounds and normal pulses.  Pulmonary/Chest: Effort normal and breath sounds normal. She has no wheezes. She has no rhonchi. She has no rales.  Neurological: She is alert.  Skin: Skin is warm and dry.  Psychiatric: She has a normal mood and affect. Her speech is normal and behavior is normal. Thought content normal.  Vitals reviewed.      Assessment & Plan:   Problem List Items Addressed This Visit      Cardiovascular and Mediastinum   HTN (hypertension)    Reasonably controlled today.  Based on her age, is reasonable to have a goal less than 150/80. We will continue to monitor; I  did not make any changes  today.        Respiratory   OSA (obstructive sleep apnea) - Primary    Compliant with cipap        Other   Iron deficiency anemia    Resolved at this time. Will follow      Anxiety    Pleased has improved. Will follow          I have discontinued Paralee Cancel. Adriano's SE-TAN PLUS. I am also having her maintain her amLODipine and metoprolol tartrate.   No orders of the defined types were placed in this encounter.   Return precautions given.   Risks, benefits, and alternatives of the medications and treatment plan prescribed today were discussed, and patient expressed understanding.   Education regarding  symptom management and diagnosis given to patient on AVS.  Continue to follow with Burnard Hawthorne, FNP for routine health maintenance.   Marjory Lies and I agreed with plan.   Mable Paris, FNP

## 2018-06-13 NOTE — Assessment & Plan Note (Signed)
Pleased has improved. Will follow

## 2018-06-13 NOTE — Patient Instructions (Addendum)
For now, you do not need b12 or iron.   We will look to see notes from endocrine when you see them in July  You need mammogram and bone density-we will work to schedule these for you.

## 2018-06-22 DIAGNOSIS — E210 Primary hyperparathyroidism: Secondary | ICD-10-CM | POA: Diagnosis not present

## 2018-06-25 DIAGNOSIS — E210 Primary hyperparathyroidism: Secondary | ICD-10-CM | POA: Diagnosis not present

## 2018-07-04 ENCOUNTER — Other Ambulatory Visit: Payer: Self-pay | Admitting: Family

## 2018-07-04 DIAGNOSIS — D509 Iron deficiency anemia, unspecified: Secondary | ICD-10-CM

## 2018-07-06 NOTE — Telephone Encounter (Signed)
Pleae advise to refill PCP last note states patient is not iron deficient.

## 2018-07-09 NOTE — Telephone Encounter (Signed)
Call pt  I declined refill of her Se Tan Plus.   Advise she takes regular OTC multivitamin  She is not iron deficient therefore I would advise to focus on diet in iron rich foods versus supplementation

## 2018-07-10 NOTE — Telephone Encounter (Signed)
Left message to return call 

## 2018-07-11 ENCOUNTER — Other Ambulatory Visit: Payer: Self-pay | Admitting: Family

## 2018-07-11 NOTE — Telephone Encounter (Signed)
Copied from [place] [id number]. Topic: Quick Communication - Rx Refill/Question >> Jul 11, 2018 12:41 PM Oliver Pila B wrote: Medication: FeFum-FePo-FA-B Cmp-C-Zn-Mn-Cu (SE-TAN PLUS) 162-115.2-1 MG CAPS [[id number]]  DISCONTINUED   Has the patient contacted their pharmacy? Yes.   (Agent: If no, request that the patient contact the pharmacy for the refill.) (Agent: If yes, when and what did the pharmacy advise?)  [facility]with phone number [facility] [place] Please be advised that RX refills may take up to 3 business days. We ask that you follow-up with your pharmacy.

## 2018-07-12 ENCOUNTER — Telehealth: Payer: Self-pay | Admitting: Family

## 2018-07-12 NOTE — Telephone Encounter (Unsigned)
Copied from [place] [id number]. Topic: Quick Communication - Office Called Patient >> Jul 12, 2018  5:38 PM Neva Seat wrote: Pt returned missed call.  Please call pt back if needed.

## 2018-07-12 NOTE — Telephone Encounter (Addendum)
Patient called, left VM to return call to the office to discuss her medication refill request.  SE-TAN PLUS refill Last OV:06/13/18 Last refill:2017 HFG:[id number] [id number] [place], [place] [address] [phone] (Phone) [phone] (Fax)

## 2018-07-13 NOTE — Telephone Encounter (Signed)
Patient returned call

## 2018-07-13 NOTE — Telephone Encounter (Signed)
Patient advised of below and verbalized understanding.  

## 2018-07-13 NOTE — Telephone Encounter (Signed)
Tried calling patient , was disconnected from phone call.

## 2018-07-13 NOTE — Telephone Encounter (Signed)
See rx response for documentation

## 2018-07-17 ENCOUNTER — Other Ambulatory Visit: Payer: Medicare Other

## 2018-07-20 ENCOUNTER — Other Ambulatory Visit: Payer: Self-pay

## 2018-08-01 DIAGNOSIS — E210 Primary hyperparathyroidism: Secondary | ICD-10-CM | POA: Diagnosis not present

## 2018-08-08 DIAGNOSIS — E210 Primary hyperparathyroidism: Secondary | ICD-10-CM | POA: Diagnosis not present

## 2018-08-13 ENCOUNTER — Ambulatory Visit
Admission: RE | Admit: 2018-08-13 | Discharge: 2018-08-13 | Disposition: A | Discharge status: Home / Self Care | Payer: Medicare Other | Source: Ambulatory Visit | Attending: Family | Admitting: Family

## 2018-08-13 DIAGNOSIS — Z1231 Encounter for screening mammogram for malignant neoplasm of breast: Secondary | ICD-10-CM | POA: Diagnosis not present

## 2018-08-13 DIAGNOSIS — M810 Age-related osteoporosis without current pathological fracture: Secondary | ICD-10-CM | POA: Diagnosis not present

## 2018-08-13 DIAGNOSIS — Z780 Asymptomatic menopausal state: Secondary | ICD-10-CM | POA: Insufficient documentation

## 2018-08-13 DIAGNOSIS — Z1239 Encounter for other screening for malignant neoplasm of breast: Secondary | ICD-10-CM

## 2018-08-13 DIAGNOSIS — Z13820 Encounter for screening for osteoporosis: Secondary | ICD-10-CM | POA: Diagnosis not present

## 2018-08-24 ENCOUNTER — Encounter: Payer: Self-pay | Admitting: Family

## 2018-08-24 DIAGNOSIS — M810 Age-related osteoporosis without current pathological fracture: Secondary | ICD-10-CM | POA: Insufficient documentation

## 2018-09-03 DIAGNOSIS — E210 Primary hyperparathyroidism: Secondary | ICD-10-CM | POA: Diagnosis not present

## 2018-09-03 DIAGNOSIS — M810 Age-related osteoporosis without current pathological fracture: Secondary | ICD-10-CM | POA: Diagnosis not present

## 2018-09-03 DIAGNOSIS — E041 Nontoxic single thyroid nodule: Secondary | ICD-10-CM | POA: Diagnosis not present

## 2018-09-19 DIAGNOSIS — Z79899 Other long term (current) drug therapy: Secondary | ICD-10-CM | POA: Diagnosis not present

## 2018-09-19 DIAGNOSIS — E213 Hyperparathyroidism, unspecified: Secondary | ICD-10-CM | POA: Diagnosis not present

## 2018-09-24 ENCOUNTER — Inpatient Hospital Stay: Payer: Medicare Other

## 2018-09-24 ENCOUNTER — Inpatient Hospital Stay: Payer: Medicare Other | Admitting: Internal Medicine

## 2018-09-24 ENCOUNTER — Other Ambulatory Visit: Payer: Self-pay | Admitting: *Deleted

## 2018-09-24 DIAGNOSIS — D509 Iron deficiency anemia, unspecified: Secondary | ICD-10-CM

## 2018-09-24 NOTE — Progress Notes (Deleted)
[facility] OFFICE PROGRESS NOTE  Patient Care Team: Burnard Hawthorne, FNP as PCP - General (Family Medicine) Dallas Schimke, MD as Consulting Physician (Internal Medicine)  Cancer Staging No matching staging information was found for the patient.    No history exists.   # IDA- ? Etiology Chronic Iron PO BID.  #     INTERVAL HISTORY:  Melanie Bird 77 y.o.  female pleasant patient above history of Anemia likely iron deficiency of unclear etiology- is here for follow-up.   Patient has chronic skin papules- for which she follows up with dermatology. She states that this are secondary to insect bites.   Patient continues to be on by mouth iron twice a day. Denies any problems tolerating by mouth iron. No blood in stools. No nausea no vomiting no weight loss. No chest pain or shortness of breath or cough. Patient has chronic arthritis. Complains of difficulty walking because of arthritis- right knee. Otherwise denies any constipation. Denies any dizziness. No falls. Denies any alcohol abuse.  REVIEW OF SYSTEMS:  A complete 10 point review of system is done which is negative except mentioned above/history of present illness.   PAST MEDICAL HISTORY :  Past Medical History:  Diagnosis Date  . Anemia   . GERD (gastroesophageal reflux disease)   . Hypertension     PAST SURGICAL HISTORY :  No past surgical history on file.  FAMILY HISTORY :   Family History  Problem Relation Age of Onset  . Diabetes Father   . Hypertension Sister   . Breast cancer Neg Hx     SOCIAL HISTORY:   Social History   Tobacco Use  . Smoking status: Never Smoker  . Smokeless tobacco: Never Used  Substance Use Topics  . Alcohol use: No  . Drug use: No    ALLERGIES:  has No Known Allergies.  MEDICATIONS:  Current Outpatient Medications  Medication Sig Dispense Refill  . amLODipine (NORVASC) 2.5 MG tablet Take 1 tablet (2.5 mg total) by mouth daily. 90 tablet 3  . metoprolol  tartrate (LOPRESSOR) 25 MG tablet TAKE 1 TABLET (25 MG TOTAL) BY MOUTH 2 (TWO) TIMES DAILY. 60 tablet 5  . omeprazole (PRILOSEC) 10 MG capsule Take 10 mg by mouth daily.     No current facility-administered medications for this visit.     PHYSICAL EXAMINATION:  There were no vitals taken for this visit.  There were no vitals filed for this visit.  GENERAL: Well-nourished well-developed; Alert, no distress and comfortable.  Alone. Walks with a cane. EYES: no pallor or icterus OROPHARYNX: no thrush or ulceration; good dentition  NECK: supple, no masses felt LYMPH:  no palpable lymphadenopathy in the cervical, axillary or inguinal regions LUNGS: clear to auscultation and  No wheeze or crackles HEART/CVS: regular rate & rhythm and no murmurs; No lower extremity edema ABDOMEN:abdomen soft, non-tender and normal bowel sounds Musculoskeletal:no cyanosis of digits and no clubbing  PSYCH: alert & oriented x 3 with fluent speech NEURO: no focal motor/sensory deficits SKIN:  Multiple raised papules- in various stages of healing.  LABORATORY DATA:  I have reviewed the data as listed    Component Value Date/Time   NA 138 12/14/2017 1209   K 4.0 12/14/2017 1209   CL 105 12/14/2017 1209   CO2 23 12/14/2017 1209   GLUCOSE 157 (H) 12/14/2017 1209   BUN 17 12/14/2017 1209   CREATININE 0.79 12/14/2017 1209   CALCIUM 10.2 03/07/2018 1518   PROT  7.3 12/14/2017 1209   ALBUMIN 3.8 12/14/2017 1209   AST 49 (H) 12/14/2017 1209   ALT 47 12/14/2017 1209   ALKPHOS 111 12/14/2017 1209   BILITOT 0.5 12/14/2017 1209   GFRNONAA >60 12/14/2017 1209   GFRAA >60 12/14/2017 1209    No results found for: SPEP, UPEP  Lab Results  Component Value Date   WBC 10.5 03/07/2018   NEUTROABS 7.9 (H) 03/07/2018   HGB 15.2 (H) 03/07/2018   HCT 44.8 03/07/2018   MCV 93.3 03/07/2018   PLT 275.0 03/07/2018      Chemistry      Component Value Date/Time   NA 138 12/14/2017 1209   K 4.0 12/14/2017 1209    CL 105 12/14/2017 1209   CO2 23 12/14/2017 1209   BUN 17 12/14/2017 1209   CREATININE 0.79 12/14/2017 1209      Component Value Date/Time   CALCIUM 10.2 03/07/2018 1518   ALKPHOS 111 12/14/2017 1209   AST 49 (H) 12/14/2017 1209   ALT 47 12/14/2017 1209   BILITOT 0.5 12/14/2017 1209       RADIOGRAPHIC STUDIES: I have personally reviewed the radiological images as listed and agreed with the findings in the report. No results found.   ASSESSMENT & PLAN:  No problem-specific Assessment & Plan notes found for this encounter.   No orders of the defined types were placed in this encounter.  All questions were answered. The patient knows to call the clinic with any problems, questions or concerns.      Cammie Sickle, MD 09/24/2018 8:24 AM

## 2018-09-25 DIAGNOSIS — E041 Nontoxic single thyroid nodule: Secondary | ICD-10-CM | POA: Diagnosis not present

## 2018-09-25 DIAGNOSIS — D351 Benign neoplasm of parathyroid gland: Secondary | ICD-10-CM | POA: Diagnosis not present

## 2018-09-25 DIAGNOSIS — D369 Benign neoplasm, unspecified site: Secondary | ICD-10-CM | POA: Diagnosis not present

## 2018-09-25 DIAGNOSIS — E210 Primary hyperparathyroidism: Secondary | ICD-10-CM | POA: Diagnosis not present

## 2018-09-25 DIAGNOSIS — R5383 Other fatigue: Secondary | ICD-10-CM | POA: Diagnosis not present

## 2018-09-25 DIAGNOSIS — E213 Hyperparathyroidism, unspecified: Secondary | ICD-10-CM | POA: Diagnosis not present

## 2018-09-25 DIAGNOSIS — I10 Essential (primary) hypertension: Secondary | ICD-10-CM | POA: Diagnosis not present

## 2018-09-26 ENCOUNTER — Ambulatory Visit: Payer: Medicare Other | Admitting: Family

## 2018-10-01 DIAGNOSIS — Z7983 Long term (current) use of bisphosphonates: Secondary | ICD-10-CM | POA: Diagnosis not present

## 2018-10-01 DIAGNOSIS — G4730 Sleep apnea, unspecified: Secondary | ICD-10-CM | POA: Diagnosis not present

## 2018-10-01 DIAGNOSIS — E210 Primary hyperparathyroidism: Secondary | ICD-10-CM | POA: Diagnosis not present

## 2018-10-01 DIAGNOSIS — M810 Age-related osteoporosis without current pathological fracture: Secondary | ICD-10-CM | POA: Diagnosis not present

## 2018-10-01 DIAGNOSIS — D351 Benign neoplasm of parathyroid gland: Secondary | ICD-10-CM | POA: Diagnosis not present

## 2018-10-01 DIAGNOSIS — Z79899 Other long term (current) drug therapy: Secondary | ICD-10-CM | POA: Diagnosis not present

## 2018-10-01 DIAGNOSIS — I10 Essential (primary) hypertension: Secondary | ICD-10-CM | POA: Diagnosis not present

## 2018-10-05 ENCOUNTER — Ambulatory Visit: Payer: Medicare Other | Admitting: Internal Medicine

## 2018-10-05 ENCOUNTER — Other Ambulatory Visit: Payer: Medicare Other

## 2018-10-23 ENCOUNTER — Ambulatory Visit: Payer: Medicare Other

## 2018-10-23 DIAGNOSIS — Z09 Encounter for follow-up examination after completed treatment for conditions other than malignant neoplasm: Secondary | ICD-10-CM | POA: Diagnosis not present

## 2018-10-23 DIAGNOSIS — E213 Hyperparathyroidism, unspecified: Secondary | ICD-10-CM | POA: Diagnosis not present

## 2018-10-29 ENCOUNTER — Ambulatory Visit (INDEPENDENT_AMBULATORY_CARE_PROVIDER_SITE_OTHER): Payer: Medicare Other | Admitting: Family

## 2018-10-29 ENCOUNTER — Encounter: Payer: Self-pay | Admitting: Family

## 2018-10-29 ENCOUNTER — Ambulatory Visit (INDEPENDENT_AMBULATORY_CARE_PROVIDER_SITE_OTHER): Payer: Medicare Other

## 2018-10-29 VITALS — BP 140/80 | HR 102 | Temp 98.0°F | Resp 16 | Ht 59.0 in | Wt 196.0 lb

## 2018-10-29 DIAGNOSIS — R062 Wheezing: Secondary | ICD-10-CM

## 2018-10-29 DIAGNOSIS — I10 Essential (primary) hypertension: Secondary | ICD-10-CM

## 2018-10-29 DIAGNOSIS — Y92009 Unspecified place in unspecified non-institutional (private) residence as the place of occurrence of the external cause: Secondary | ICD-10-CM

## 2018-10-29 DIAGNOSIS — R5383 Other fatigue: Secondary | ICD-10-CM | POA: Diagnosis not present

## 2018-10-29 DIAGNOSIS — M810 Age-related osteoporosis without current pathological fracture: Secondary | ICD-10-CM

## 2018-10-29 DIAGNOSIS — G4733 Obstructive sleep apnea (adult) (pediatric): Secondary | ICD-10-CM

## 2018-10-29 DIAGNOSIS — F324 Major depressive disorder, single episode, in partial remission: Secondary | ICD-10-CM | POA: Diagnosis not present

## 2018-10-29 DIAGNOSIS — W19XXXD Unspecified fall, subsequent encounter: Secondary | ICD-10-CM

## 2018-10-29 DIAGNOSIS — F339 Major depressive disorder, recurrent, unspecified: Secondary | ICD-10-CM

## 2018-10-29 DIAGNOSIS — E210 Primary hyperparathyroidism: Secondary | ICD-10-CM | POA: Diagnosis not present

## 2018-10-29 DIAGNOSIS — K449 Diaphragmatic hernia without obstruction or gangrene: Secondary | ICD-10-CM | POA: Diagnosis not present

## 2018-10-29 MED ORDER — ALBUTEROL SULFATE HFA 108 (90 BASE) MCG/ACT IN AERS: 2.0000 | INHALATION_SPRAY | Freq: Four times a day (QID) | RESPIRATORY_TRACT | 2 refills | Status: DC | PRN

## 2018-10-29 MED ORDER — TRAZODONE HCL 50 MG PO TABS: 25.0000 mg | ORAL_TABLET | Freq: Every evening | ORAL | 3 refills | Status: DC | PRN

## 2018-10-29 NOTE — Assessment & Plan Note (Signed)
Suspect multifactorial as discussed with patient and daughter at length today.  Reassured as walking SaO2 stayed above 95%.  Suspect deconditioning, weight  Gain,  noncompliance with CPAP, depression are contributory.  Pending extensive lab evaluation for other etiologies.  Advised very close follow-up within the month.  We will also start trial of low-dose trazodone to aid with sleep, depression.

## 2018-10-29 NOTE — Assessment & Plan Note (Signed)
Not compliant with CPAP.  Discussed at length risk for stroke, heart attack with noncompliance.  Emphasized the importance of wearing CPAP machine.  Patient and daughter verbalized understanding of this.

## 2018-10-29 NOTE — Assessment & Plan Note (Signed)
Compliant with Fosamax.  Continue regimen.

## 2018-10-29 NOTE — Progress Notes (Signed)
Subjective:    Patient ID: CALEAH TORTORELLI, female    DOB: 06/16/1941, 77 y.o.   MRN: [id number]  CC: EARLENA WERST is a 77 y.o. female who presents today for follow up.   HPI: Complains of being tired x 2 years, unchanged.Accompanied by daughter whom she lives with.   Feels depressed. Not sleeping well. Trouble falling asleep. Doesn't feel restored after sleep. Not being able to take of self like she had in the past and she is frustrated.   Daughter states moves from bed to couch. Not doing anything around the house.   Not exercising. Feels SOB with short distances, thinks weight gain has worsened. Never been to cardiology. NO Cp.      Fell 8 months ago   Complains of yellow congestion x one week. No fever Tried mucinex with some relief. No sob today. No leg swelling. Wheezing when walks from house to car 'for years.'  No CP. No sinus pain, nasal congestion. Never smoker. Never had to use an inhaler. No seasonal allergies.   OSA-not wearing cipap.    Osteoporosis- on fosamax.   Recently had a parathyroidectomy.  Mammogram UTD Dr Maudie Mercury 10/2018- advised calcium screening periodically HISTORY:  Past Medical History:  Diagnosis Date  . Anemia   . GERD (gastroesophageal reflux disease)   . Hypertension    History reviewed. No pertinent surgical history. Family History  Problem Relation Age of Onset  . Diabetes Father   . Hypertension Sister   . Breast cancer Neg Hx     Allergies: Patient has no known allergies. Current Outpatient Medications on File Prior to Visit  Medication Sig Dispense Refill  . alendronate (FOSAMAX) 70 MG tablet Take by mouth.    Marland Kitchen amLODipine (NORVASC) 2.5 MG tablet Take 1 tablet (2.5 mg total) by mouth daily. 90 tablet 3  . FeFum-FePo-FA-B Cmp-C-Zn-Mn-Cu (SE-TAN PLUS) 162-115.2-1 MG CAPS Take by mouth.    . metoprolol tartrate (LOPRESSOR) 25 MG tablet TAKE 1 TABLET (25 MG TOTAL) BY MOUTH 2 (TWO) TIMES DAILY. 60 tablet 5  . omeprazole (PRILOSEC)  10 MG capsule Take 10 mg by mouth daily.    . Vitamin D, Ergocalciferol, (DRISDOL) 1.25 MG (50000 UT) CAPS capsule TAKE 1 CAPSULE (50,000 UNITS TOTAL) BY MOUTH EVERY 7 (SEVEN) DAYS.     No current facility-administered medications on file prior to visit.     Social History   Tobacco Use  . Smoking status: Never Smoker  . Smokeless tobacco: Never Used  Substance Use Topics  . Alcohol use: No  . Drug use: No    Review of Systems  Constitutional: Negative for chills and fever.  HENT: Positive for congestion.   Respiratory: Positive for shortness of breath and wheezing. Negative for cough.   Cardiovascular: Negative for chest pain and palpitations.  Gastrointestinal: Negative for nausea and vomiting.      Objective:    BP 140/80 (BP Location: Left Arm, Patient Position: Sitting, Cuff Size: Large)   Pulse (!) 102   Temp 98 F (36.7 C) (Oral)   Resp 16   Ht 4\' 11"  (1.499 m)   Wt 196 lb (88.9 kg)   SpO2 95% Comment: with walking  BMI 39.59 kg/m  BP Readings from Last 3 Encounters:  10/29/18 140/80  06/13/18 120/90  03/07/18 (!) 164/102   Wt Readings from Last 3 Encounters:  10/29/18 196 lb (88.9 kg)  06/13/18 188 lb 6 oz (85.4 kg)  03/07/18 189 lb (85.7 kg)  Physical Exam  Constitutional: She appears well-developed and well-nourished.  HENT:  Head: Normocephalic and atraumatic.  Right Ear: Hearing, tympanic membrane, external ear and ear canal normal. No drainage, swelling or tenderness. No foreign bodies. Tympanic membrane is not erythematous and not bulging. No middle ear effusion. No decreased hearing is noted.  Left Ear: Hearing, tympanic membrane, external ear and ear canal normal. No drainage, swelling or tenderness. No foreign bodies. Tympanic membrane is not erythematous and not bulging.  No middle ear effusion. No decreased hearing is noted.  Nose: Nose normal. No rhinorrhea. Right sinus exhibits no maxillary sinus tenderness and no frontal sinus tenderness.  Left sinus exhibits no maxillary sinus tenderness and no frontal sinus tenderness.  Mouth/Throat: Uvula is midline, oropharynx is clear and moist and mucous membranes are normal. No oropharyngeal exudate, posterior oropharyngeal edema, posterior oropharyngeal erythema or tonsillar abscesses.  Eyes: Conjunctivae are normal.  Cardiovascular: Regular rhythm, normal heart sounds and normal pulses.  Pulmonary/Chest: Effort normal and breath sounds normal. She has no wheezes. She has no rhonchi. She has no rales.  Lymphadenopathy:       Head (right side): No submental, no submandibular, no tonsillar, no preauricular, no posterior auricular and no occipital adenopathy present.       Head (left side): No submental, no submandibular, no tonsillar, no preauricular, no posterior auricular and no occipital adenopathy present.    She has no cervical adenopathy.  Neurological: She is alert.  Skin: Skin is warm and dry.  Psychiatric: She has a normal mood and affect. Her speech is normal and behavior is normal. Thought content normal.  Vitals reviewed.      Assessment & Plan:   Problem List Items Addressed This Visit      Cardiovascular and Mediastinum   HTN (hypertension)    Blood pressure is reasonable for her age.  No changes made today.  Will follow        Respiratory   OSA (obstructive sleep apnea)    Not compliant with CPAP.  Discussed at length risk for stroke, heart attack with noncompliance.  Emphasized the importance of wearing CPAP machine.  Patient and daughter verbalized understanding of this.        Endocrine   Primary hyperparathyroidism ([place])    We will continue to follow calcium periodically.        Musculoskeletal and Integument   Osteoporosis    Compliant with Fosamax.  Continue regimen.      Relevant Medications   Vitamin D, Ergocalciferol, (DRISDOL) 1.25 MG (50000 UT) CAPS capsule   alendronate (FOSAMAX) 70 MG tablet     Other   Depression, recurrent (HCC)    Relevant Medications   traZODone (DESYREL) 50 MG tablet   Fatigue - Primary    Suspect multifactorial as discussed with patient and daughter at length today.  Reassured as walking SaO2 stayed above 95%.  Suspect deconditioning, weight  Gain,  noncompliance with CPAP, depression are contributory.  Pending extensive lab evaluation for other etiologies.  Advised very close follow-up within the month.  We will also start trial of low-dose trazodone to aid with sleep, depression.      Relevant Medications   traZODone (DESYREL) 50 MG tablet   Other Relevant Orders   CBC with Differential/Platelet   Comprehensive metabolic panel   Hemoglobin A1c   TSH   VITAMIN D 25 Hydroxy (Vit-D Deficiency, Fractures)   B12 and Folate Panel   Fall at home, subsequent encounter    [place] 8  Months ago. Declines home health consult with PT, nursing. Will discuss again at f/u appointments.       Wheezing    No acute respiratory distress.  No adventitious lung sounds on exam today.  Pending chest x-ray to look for cardiomegaly, acute process.  Prn albuterol given.  Close follow-up in 1 month.      Relevant Medications   albuterol (PROVENTIL HFA;VENTOLIN HFA) 108 (90 Base) MCG/ACT inhaler   Other Relevant Orders   DG Chest 2 View   Depression, major, single episode, in partial remission ([place])   Relevant Medications   traZODone (DESYREL) 50 MG tablet       I am having Paralee Cancel. Lister start on traZODone and albuterol. I am also having her maintain her amLODipine, metoprolol tartrate, omeprazole, Vitamin D (Ergocalciferol), alendronate, and SE-TAN PLUS.   Meds ordered this encounter  Medications  . traZODone (DESYREL) 50 MG tablet    Sig: Take 0.5-1 tablets (25-50 mg total) by mouth at bedtime as needed for sleep.    Dispense:  30 tablet    Refill:  3    Order Specific Question:   Supervising Provider    Answer:   Deborra Medina L [[id number]]  . albuterol (PROVENTIL HFA;VENTOLIN HFA) 108 (90 Base) MCG/ACT  inhaler    Sig: Inhale 2 puffs into the lungs every 6 (six) hours as needed for wheezing or shortness of breath.    Dispense:  1 Inhaler    Refill:  2    Order Specific Question:   Supervising Provider    Answer:   Crecencio Mc [[id number]]    Return precautions given.   Risks, benefits, and alternatives of the medications and treatment plan prescribed today were discussed, and patient expressed understanding.   Education regarding symptom management and diagnosis given to patient on AVS.  Continue to follow with Burnard Hawthorne, FNP for routine health maintenance.   Marjory Lies and I agreed with plan.   Mable Paris, FNP

## 2018-10-29 NOTE — Patient Instructions (Addendum)
Start cpap again.   Trial of trazodone at bedtime.   Albuterol inhaler as prescribed  Close follow up

## 2018-10-29 NOTE — Assessment & Plan Note (Signed)
We will continue to follow calcium periodically.

## 2018-10-29 NOTE — Assessment & Plan Note (Signed)
Blood pressure is reasonable for her age.  No changes made today.  Will follow

## 2018-10-29 NOTE — Assessment & Plan Note (Addendum)
No acute respiratory distress.  No adventitious lung sounds on exam today.  Pending chest x-ray to look for cardiomegaly, acute process.  Prn albuterol given.  Close follow-up in 1 month.

## 2018-10-29 NOTE — Assessment & Plan Note (Addendum)
Fell 8  Months ago. Declines home health consult with PT, nursing. Will discuss again at f/u appointments.

## 2018-10-30 LAB — CBC WITH DIFFERENTIAL/PLATELET
BASOS PCT: 0.5 % (ref 0.0–3.0)
Basophils Absolute: 0.1 10*3/uL (ref 0.0–0.1)
EOS ABS: 0.1 10*3/uL (ref 0.0–0.7)
EOS PCT: 0.5 % (ref 0.0–5.0)
HEMATOCRIT: 37.8 % (ref 36.0–46.0)
HEMOGLOBIN: 12.1 g/dL (ref 12.0–15.0)
LYMPHS PCT: 10.1 % — AB (ref 12.0–46.0)
Lymphs Abs: 1.2 10*3/uL (ref 0.7–4.0)
MCHC: 31.9 g/dL (ref 30.0–36.0)
MCV: 87.3 fl (ref 78.0–100.0)
Monocytes Absolute: 0.7 10*3/uL (ref 0.1–1.0)
Monocytes Relative: 5.6 % (ref 3.0–12.0)
Neutro Abs: 9.9 10*3/uL — ABNORMAL HIGH (ref 1.4–7.7)
Neutrophils Relative %: 83.3 % — ABNORMAL HIGH (ref 43.0–77.0)
Platelets: 357 10*3/uL (ref 150.0–400.0)
RBC: 4.34 Mil/uL (ref 3.87–5.11)
RDW: 14.7 % (ref 11.5–15.5)
WBC: 11.9 10*3/uL — ABNORMAL HIGH (ref 4.0–10.5)

## 2018-10-30 LAB — COMPREHENSIVE METABOLIC PANEL
ALBUMIN: 3.9 g/dL (ref 3.5–5.2)
ALK PHOS: 88 U/L (ref 39–117)
ALT: 33 U/L (ref 0–35)
AST: 31 U/L (ref 0–37)
BILIRUBIN TOTAL: 0.3 mg/dL (ref 0.2–1.2)
BUN: 11 mg/dL (ref 6–23)
CALCIUM: 9.4 mg/dL (ref 8.4–10.5)
CO2: 26 mEq/L (ref 19–32)
CREATININE: 0.72 mg/dL (ref 0.40–1.20)
Chloride: 105 mEq/L (ref 96–112)
GFR: 82.88 mL/min (ref >60.00)
Glucose, Bld: 208 mg/dL — ABNORMAL HIGH (ref 70–99)
Potassium: 5.1 mEq/L (ref 3.5–5.1)
Sodium: 142 mEq/L (ref 135–145)
TOTAL PROTEIN: 7 g/dL (ref 6.0–8.3)

## 2018-10-30 LAB — TSH: TSH: 2.56 u[IU]/mL (ref 0.35–4.50)

## 2018-10-30 LAB — HEMOGLOBIN A1C: Hgb A1c MFr Bld: 8.1 % — ABNORMAL HIGH (ref 4.6–6.5)

## 2018-10-30 LAB — B12 AND FOLATE PANEL
Folate: >23.9 ng/mL (ref >5.9)
Vitamin B-12: 606 pg/mL (ref 211–911)

## 2018-10-30 LAB — VITAMIN D 25 HYDROXY (VIT D DEFICIENCY, FRACTURES): VITD: 29.17 ng/mL — ABNORMAL LOW (ref 30.00–100.00)

## 2018-10-31 ENCOUNTER — Other Ambulatory Visit: Payer: Self-pay | Admitting: Family

## 2018-10-31 DIAGNOSIS — E119 Type 2 diabetes mellitus without complications: Secondary | ICD-10-CM | POA: Insufficient documentation

## 2018-10-31 MED ORDER — METFORMIN HCL ER 500 MG PO TB24: ORAL_TABLET | ORAL | 3 refills | Status: DC

## 2018-11-12 ENCOUNTER — Telehealth: Payer: Self-pay | Admitting: Family

## 2018-11-12 ENCOUNTER — Other Ambulatory Visit: Payer: Self-pay | Admitting: Family

## 2018-11-12 NOTE — Telephone Encounter (Signed)
Lopressor refill sent to pharmacy at 12:27 PM today by CMA

## 2018-11-12 NOTE — Telephone Encounter (Signed)
Copied from [place] [id number]. Topic: Quick Communication - Rx Refill/Question >> Nov 12, 2018 11:04 AM Cecelia Byars, NT wrote: Medication: metoprolol tartrate (LOPRESSOR) 25 MG tablet   Has the patient contacted their pharmacy? yes  (Agent: If no, request that the patient contact the pharmacy for the refill. (Agent: If yes, when and what did the pharmacy advise?) Patient advised to call the office for refills  Preferred Pharmacy (with phone number or street name [id number] - [place], [place] [address] [phone] (Phone) [phone] (Fax)    Agent: Please be advised that RX refills may take up to 3 business days. We ask that you follow-up with your pharmacy.

## 2018-11-20 ENCOUNTER — Other Ambulatory Visit: Payer: Self-pay | Admitting: Family

## 2018-11-20 DIAGNOSIS — F324 Major depressive disorder, single episode, in partial remission: Secondary | ICD-10-CM

## 2018-11-20 DIAGNOSIS — R5383 Other fatigue: Secondary | ICD-10-CM

## 2018-11-28 ENCOUNTER — Encounter: Payer: Self-pay | Admitting: Family

## 2018-11-28 ENCOUNTER — Ambulatory Visit (INDEPENDENT_AMBULATORY_CARE_PROVIDER_SITE_OTHER): Payer: Medicare Other | Admitting: Family

## 2018-11-28 VITALS — BP 140/80 | HR 81 | Temp 97.7°F | Wt 194.4 lb

## 2018-11-28 DIAGNOSIS — M25561 Pain in right knee: Secondary | ICD-10-CM

## 2018-11-28 DIAGNOSIS — G8929 Other chronic pain: Secondary | ICD-10-CM | POA: Insufficient documentation

## 2018-11-28 DIAGNOSIS — F324 Major depressive disorder, single episode, in partial remission: Secondary | ICD-10-CM | POA: Diagnosis not present

## 2018-11-28 DIAGNOSIS — I10 Essential (primary) hypertension: Secondary | ICD-10-CM | POA: Diagnosis not present

## 2018-11-28 DIAGNOSIS — G4733 Obstructive sleep apnea (adult) (pediatric): Secondary | ICD-10-CM

## 2018-11-28 DIAGNOSIS — R5382 Chronic fatigue, unspecified: Secondary | ICD-10-CM | POA: Diagnosis not present

## 2018-11-28 DIAGNOSIS — Z23 Encounter for immunization: Secondary | ICD-10-CM

## 2018-11-28 MED ORDER — AMLODIPINE BESYLATE 5 MG PO TABS: 5.0000 mg | ORAL_TABLET | Freq: Every day | ORAL | 1 refills | Status: DC

## 2018-11-28 MED ORDER — MELOXICAM 7.5 MG PO TABS: 7.5000 mg | ORAL_TABLET | Freq: Every day | ORAL | 0 refills | Status: DC | PRN

## 2018-11-28 NOTE — Patient Instructions (Addendum)
Take trazodone 50mg  every night. We can increase at follow up.   Please increase her amlodipine to 5 mg once per day.  I have prescribed meloxicam or Mobic for your knee pain.  Please use medication sparingly as it is a prescription strength anti-inflammatory.  Take it when pain is moderate to severe.  Mobic is in the same drug class as Aleve, ibuprofen.  You must take this medication with food and do not take other anti-inflammatories with this medication.   Today we discussed referrals, orders. orthopedics   I have placed these orders in the system for you.  Please be sure to give [place] a call if you have not heard from our office regarding this. We should hear from [place] within ONE week with information regarding your appointment. If not, please let me know immediately.      This is  Dr. Lupita Dawn  example of a  "Low GI"  Diet:  It will allow you to lose 4 to 8  lbs  per month if you follow it carefully.  Your goal with exercise is a minimum of 30 minutes of aerobic exercise 5 days per week (Walking does not count once it becomes easy!)    All of the foods can be found at grocery stores and in bulk at [employer].  The Atkins protein bars and shakes are available in more varieties at [employer], [employer] and [place].     7 AM Breakfast:  Choose from the following:  Low carbohydrate Protein  Shakes (I recommend the  Premier Protein chocolate shakes,  EAS AdvantEdge "Carb Control" shakes  Or the Atkins shakes all are under 3 net carbs)     a scrambled egg/bacon/cheese burrito made with [place]'s "carb balance" whole wheat tortilla  (about 10 net carbs )  Regulatory affairs officer (basically a quiche without the pastry crust) that is eaten cold and very convenient way to get your eggs.  8 carbs)  If you make your own protein shakes, avoid bananas and pineapple,  And use low carb greek yogurt or original /unsweetened almond or soy milk    Avoid cereal and bananas, oatmeal and cream of  wheat and grits. They are loaded with carbohydrates!   10 AM: high protein snack:  Protein bar by Atkins (the snack size, under 200 cal, usually < 6 net carbs).    A stick of cheese:  Around 1 carb,  100 cal     Dannon Light n Fit [place] Yogurt  (80 cal, 8 carbs)  Other so called "protein bars" and Greek yogurts tend to be loaded with carbohydrates.  Remember, in food advertising, the word "energy" is synonymous for " carbohydrate."  Lunch:   A Sandwich using the bread choices listed, Can use any  Eggs,  lunchmeat, grilled meat or canned tuna), avocado, regular mayo/mustard  and cheese.  A Salad using blue cheese, ranch,  Goddess or vinagrette,  Avoid taco shells, croutons or "confetti" and no "candied nuts" but regular nuts OK.   No pretzels, nabs  or chips.  Pickles and miniature sweet peppers are a good low carb alternative that provide a "crunch"  The bread is the only source of carbohydrate in a sandwich and  can be decreased by trying some of the attached alternatives to traditional loaf bread   Avoid "Low fat dressings, as well as Barry Brunner and [employer] dressings They are loaded with sugar!   3 PM/ Mid day  Snack:  Consider  1 ounce of  almonds, walnuts, pistachios, pecans, peanuts,  Macadamia nuts or a nut medley.  Avoid "granola and granola bars "  Mixed nuts are ok in moderation as long as there are no raisins,  cranberries or dried fruit.   KIND bars are OK if you get the low glycemic index variety   Try the prosciutto/mozzarella cheese sticks by [employer]  In deli /backery section   High protein      6 PM  Dinner:     Meat/fowl/fish with a green salad, and either broccoli, cauliflower, green beans, spinach, brussel sprouts or  Lima beans. DO NOT BREAD THE PROTEIN!!      There is a low carb pasta by [employer]'s that is acceptable and tastes great: only 5 digestible carbs/serving.( All grocery stores but BJs carry it ) Several ready made meals are available low carb:    Try Michel Angelo's chicken piccata or chicken or eggplant parm over low carb pasta.(Lowes and BJs)   Marjory Lies Sanchez's "Carnitas" (pulled pork, no sauce,  0 carbs) or his beef pot roast to make a dinner burrito (at [employer]J's)  Pesto over low carb pasta (bj's sells a good quality pesto in the center refrigerated section of the deli   Try satueeing  Cheral Marker with mushroooms as a good side   [employer] makes a mashed cauliflower that tastes like mashed potatoes  Whole wheat pasta is still full of digestible carbs and  Not as low in glycemic index as Dreamfield's.   Brown rice is still rice,  So skip the rice and noodles if you eat [place] or [place] (or at least limit to 1/2 cup)  9 PM snack :   Breyer's "low carb" fudgsicle or  ice cream bar (Carb Smart line), or  Weight Watcher's ice cream bar , or another "no sugar added" ice cream;  a serving of fresh berries/cherries with whipped cream   Cheese or DANNON'S LlGHT N FIT GREEK YOGURT  8 ounces of [place] unsweetened almond/cococunut milk    Treat yourself to a parfait made with whipped cream blueberiies, walnuts and vanilla greek yogurt  Avoid bananas, pineapple, grapes  and watermelon on a regular basis because they are high in sugar.  THINK OF THEM AS DESSERT  Remember that snack Substitutions should be less than 10 NET carbs per serving and meals < 20 carbs. Remember to subtract fiber grams to get the "net carbs."  @TULLOBREADPACKAGE @

## 2018-11-28 NOTE — Assessment & Plan Note (Signed)
Little elevated today, we agreed to increase amlodipine to 5 mg.  Will follow

## 2018-11-28 NOTE — Assessment & Plan Note (Signed)
Sleep improved however patient's not been taking trazodone daily.  No real change in depression.  Advised her to start taking daily.  Close follow-up

## 2018-11-28 NOTE — Assessment & Plan Note (Signed)
Somewhat improved.  Suspect depression, poor sleep, lack of exercise contributory.  Discussed with patient that she seen cardiology for years ago.  Does not appear she ever had a stress test.  Advised that I would recommend     cardiology consult.  Patient and daughter politely declined at this time as they suspect fatigue is related to weight gain.  Advised to stay vigilant for any new or worsening symptoms.

## 2018-11-28 NOTE — Assessment & Plan Note (Signed)
Continues to be noncompliant with CPAP machine.  Advised this would help fatigue.  Counseled on risks of untreated sleep apnea.

## 2018-11-28 NOTE — Progress Notes (Signed)
Subjective:    Patient ID: Melanie Bird, female    DOB: 25-Oct-1941, 77 y.o.   MRN: [id number]  CC: Melanie Bird is a 77 y.o. female who presents today for follow up.   HPI: Fatigue has improved.  Accompanied with daughter today.  Complains of chronic right knee pain-worsening.  No swelling, fever.  Inhibits ability to exercise.  Had a steroid injection in the past, and she would like to do this again  .  No exercise.  Walks with cane. No CP on exertion. No giving out.  Use a topical salve with some relief.  Doesn't work in yard like used too. Eats sweets when tired.  No history of GI bleed  OSA- not wearing cipap machine.   Depression- started trazodone, taking prn. Helps with sleep.   DM- on metformin.   HTN-compliant with medication.  No chest pain.  Does not check blood pressure at home  2015 Dr Acie Fredrickson, SOB.  HISTORY:  Past Medical History:  Diagnosis Date  . Anemia   . GERD (gastroesophageal reflux disease)   . Hypertension    History reviewed. No pertinent surgical history. Family History  Problem Relation Age of Onset  . Diabetes Father   . Hypertension Sister   . Breast cancer Neg Hx     Allergies: Patient has no known allergies. Current Outpatient Medications on File Prior to Visit  Medication Sig Dispense Refill  . albuterol (PROVENTIL HFA;VENTOLIN HFA) 108 (90 Base) MCG/ACT inhaler Inhale 2 puffs into the lungs every 6 (six) hours as needed for wheezing or shortness of breath. 1 Inhaler 2  . alendronate (FOSAMAX) 70 MG tablet Take by mouth.    . FeFum-FePo-FA-B Cmp-C-Zn-Mn-Cu (SE-TAN PLUS) 162-115.2-1 MG CAPS Take by mouth.    . metFORMIN (GLUCOPHAGE XR) 500 MG 24 hr tablet Start 500mg  PO qpm. 90 tablet 3  . metoprolol tartrate (LOPRESSOR) 25 MG tablet TAKE 1 TABLET BY MOUTH TWICE A DAY 180 tablet 1  . omeprazole (PRILOSEC) 10 MG capsule Take 10 mg by mouth daily.    . traZODone (DESYREL) 50 MG tablet TAKE 0.5-1 TABLETS (25-50 MG TOTAL) BY MOUTH AT BEDTIME  AS NEEDED FOR SLEEP. 90 tablet 3  . Vitamin D, Ergocalciferol, (DRISDOL) 1.25 MG (50000 UT) CAPS capsule TAKE 1 CAPSULE (50,000 UNITS TOTAL) BY MOUTH EVERY 7 (SEVEN) DAYS.     No current facility-administered medications on file prior to visit.     Social History   Tobacco Use  . Smoking status: Never Smoker  . Smokeless tobacco: Never Used  Substance Use Topics  . Alcohol use: No  . Drug use: No    Review of Systems  Constitutional: Positive for fatigue. Negative for chills and fever.  Respiratory: Negative for cough.   Cardiovascular: Negative for chest pain and palpitations.  Gastrointestinal: Negative for nausea and vomiting.  Musculoskeletal: Positive for arthralgias.  Psychiatric/Behavioral: Negative for sleep disturbance. The patient is not nervous/anxious.       Objective:    BP 140/80   Pulse 81   Temp 97.7 F (36.5 C)   Wt 194 lb 6.4 oz (88.2 kg)   SpO2 96%   BMI 39.26 kg/m  BP Readings from Last 3 Encounters:  11/28/18 140/80  10/29/18 140/80  06/13/18 120/90   Wt Readings from Last 3 Encounters:  11/28/18 194 lb 6.4 oz (88.2 kg)  10/29/18 196 lb (88.9 kg)  06/13/18 188 lb 6 oz (85.4 kg)    Physical Exam  Constitutional: She  appears well-developed and well-nourished.  Eyes: Conjunctivae are normal.  Cardiovascular: Normal rate, regular rhythm, normal heart sounds and normal pulses.  Pulmonary/Chest: Effort normal and breath sounds normal. She has no wheezes. She has no rhonchi. She has no rales.  Neurological: She is alert.  Skin: Skin is warm and dry.  Psychiatric: She has a normal mood and affect. Her speech is normal and behavior is normal. Thought content normal.  Vitals reviewed.      Assessment & Plan:   Problem List Items Addressed This Visit      Cardiovascular and Mediastinum   HTN (hypertension)    Little elevated today, we agreed to increase amlodipine to 5 mg.  Will follow      Relevant Medications   amLODipine (NORVASC) 5 MG  tablet     Respiratory   OSA (obstructive sleep apnea)    Continues to be noncompliant with CPAP machine.  Advised this would help fatigue.  Counseled on risks of untreated sleep apnea.        Other   Fatigue - Primary    Somewhat improved.  Suspect depression, poor sleep, lack of exercise contributory.  Discussed with patient that she seen cardiology for years ago.  Does not appear she ever had a stress test.  Advised that I would recommend     cardiology consult.  Patient and daughter politely declined at this time as they suspect fatigue is related to weight gain.  Advised to stay vigilant for any new or worsening symptoms.      Depression, major, single episode, in partial remission ([place])    Sleep improved however patient's not been taking trazodone daily.  No real change in depression.  Advised her to start taking daily.  Close follow-up      Chronic pain of right knee    Chronic.  I have given 30 tabs of meloxicam to be used sparingly.  Pending referral to orthopedics.  Will follow      Relevant Medications   meloxicam (MOBIC) 7.5 MG tablet   Other Relevant Orders   Ambulatory referral to Orthopedic Surgery    Other Visit Diagnoses    Encounter for immunization       Relevant Orders   Flu vaccine HIGH DOSE PF (Completed)       I have changed Paralee Cancel. Rightmyer's amLODipine. I am also having her start on meloxicam. Additionally, I am having her maintain her omeprazole, Vitamin D (Ergocalciferol), alendronate, SE-TAN PLUS, albuterol, metFORMIN, metoprolol tartrate, and traZODone.   Meds ordered this encounter  Medications  . amLODipine (NORVASC) 5 MG tablet    Sig: Take 1 tablet (5 mg total) by mouth daily.    Dispense:  90 tablet    Refill:  1    Order Specific Question:   Supervising Provider    Answer:   Deborra Medina L [[id number]]  . meloxicam (MOBIC) 7.5 MG tablet    Sig: Take 1 tablet (7.5 mg total) by mouth daily as needed for pain (severe pain).    Dispense:  30  tablet    Refill:  0    Order Specific Question:   Supervising Provider    Answer:   Crecencio Mc [[id number]]    Return precautions given.   Risks, benefits, and alternatives of the medications and treatment plan prescribed today were discussed, and patient expressed understanding.   Education regarding symptom management and diagnosis given to patient on AVS.  Continue to follow with Vidal Schwalbe Yvetta Coder, FNP for  routine health maintenance.   Marjory Lies and I agreed with plan.   Mable Paris, FNP

## 2018-11-28 NOTE — Assessment & Plan Note (Signed)
Chronic.  I have given 30 tabs of meloxicam to be used sparingly.  Pending referral to orthopedics.  Will follow

## 2018-12-06 ENCOUNTER — Other Ambulatory Visit: Payer: Self-pay | Admitting: Family Medicine

## 2018-12-07 NOTE — Telephone Encounter (Signed)
Call patient, she had been on what we consider the megadose vitamin D 50,000 units. Based on her level of vitamin D in November I would recommend the below.  Please advise patient to stop 50000 unit dose.  Vitamin D is slightly low. Please start cholecalciferol 800 units daily for next 3 to 4 months. You may find this over the counter in the drug store. Please call to make an appointment for 3 to 4 months out so we can recheck. Also, please ensure you are following a diet high in calcium -- research shows better outcomes with dietary sources including kale, yogurt, broccolii, cheese, okra, almonds- to name a few.

## 2018-12-14 NOTE — Telephone Encounter (Signed)
LMTCB

## 2018-12-20 ENCOUNTER — Other Ambulatory Visit: Payer: Self-pay | Admitting: Family

## 2018-12-20 DIAGNOSIS — R5383 Other fatigue: Secondary | ICD-10-CM

## 2018-12-20 DIAGNOSIS — F324 Major depressive disorder, single episode, in partial remission: Secondary | ICD-10-CM

## 2018-12-20 DIAGNOSIS — M25561 Pain in right knee: Secondary | ICD-10-CM

## 2018-12-20 DIAGNOSIS — G8929 Other chronic pain: Secondary | ICD-10-CM

## 2018-12-23 NOTE — Telephone Encounter (Signed)
Call patient I refilled her meloxicam and her trazodone as requested. Please ensure to educate patient that I really prescribed the meloxicam to be used as needed, based on the refill, it appears to be using it every day.  Has she gotten appointment yet with orthopedics?     Melissa, please let me know status of ortho appointment. Thank you!

## 2018-12-24 NOTE — Telephone Encounter (Signed)
Referral was sent to [facility] ortho. They have called her and lvm asking her to rtc. No appointment has been scheduled yet.

## 2018-12-24 NOTE — Telephone Encounter (Unsigned)
Copied from [place] [id number]. Topic: General - Other >> Dec 24, 2018  8:47 AM Nils Flack wrote: Reason for CRM: pt called, she says that she already had a bottle of  trazadone.  She says there was a bottle that was pushed to the back of the cabinet ans she is sorry that she made a mistake and bothered [place].   She does not need current refill, she has a bottle left.

## 2019-01-04 DIAGNOSIS — M1712 Unilateral primary osteoarthritis, left knee: Secondary | ICD-10-CM | POA: Diagnosis not present

## 2019-01-04 DIAGNOSIS — G8929 Other chronic pain: Secondary | ICD-10-CM | POA: Diagnosis not present

## 2019-01-04 DIAGNOSIS — M1711 Unilateral primary osteoarthritis, right knee: Secondary | ICD-10-CM | POA: Diagnosis not present

## 2019-01-04 DIAGNOSIS — M76891 Other specified enthesopathies of right lower limb, excluding foot: Secondary | ICD-10-CM | POA: Diagnosis not present

## 2019-01-04 DIAGNOSIS — M25561 Pain in right knee: Secondary | ICD-10-CM | POA: Diagnosis not present

## 2019-01-15 DIAGNOSIS — H401131 Primary open-angle glaucoma, bilateral, mild stage: Secondary | ICD-10-CM | POA: Diagnosis not present

## 2019-01-17 ENCOUNTER — Other Ambulatory Visit: Payer: Self-pay | Admitting: Family

## 2019-01-17 DIAGNOSIS — G8929 Other chronic pain: Secondary | ICD-10-CM

## 2019-01-17 DIAGNOSIS — M25561 Pain in right knee: Principal | ICD-10-CM

## 2019-02-12 DIAGNOSIS — H401131 Primary open-angle glaucoma, bilateral, mild stage: Secondary | ICD-10-CM | POA: Diagnosis not present

## 2019-02-12 DIAGNOSIS — H2513 Age-related nuclear cataract, bilateral: Secondary | ICD-10-CM | POA: Diagnosis not present

## 2019-02-14 ENCOUNTER — Other Ambulatory Visit: Payer: Self-pay | Admitting: Family

## 2019-02-14 DIAGNOSIS — M25561 Pain in right knee: Principal | ICD-10-CM

## 2019-02-14 DIAGNOSIS — G8929 Other chronic pain: Secondary | ICD-10-CM

## 2019-02-19 DIAGNOSIS — H401132 Primary open-angle glaucoma, bilateral, moderate stage: Secondary | ICD-10-CM | POA: Diagnosis not present

## 2019-02-19 DIAGNOSIS — H2513 Age-related nuclear cataract, bilateral: Secondary | ICD-10-CM | POA: Diagnosis not present

## 2019-02-19 DIAGNOSIS — H2512 Age-related nuclear cataract, left eye: Secondary | ICD-10-CM | POA: Diagnosis not present

## 2019-02-19 DIAGNOSIS — H25013 Cortical age-related cataract, bilateral: Secondary | ICD-10-CM | POA: Diagnosis not present

## 2019-02-19 DIAGNOSIS — H25043 Posterior subcapsular polar age-related cataract, bilateral: Secondary | ICD-10-CM | POA: Diagnosis not present

## 2019-02-19 DIAGNOSIS — H18413 Arcus senilis, bilateral: Secondary | ICD-10-CM | POA: Diagnosis not present

## 2019-02-27 ENCOUNTER — Encounter: Payer: Self-pay | Admitting: Family

## 2019-02-27 ENCOUNTER — Other Ambulatory Visit: Payer: Self-pay

## 2019-02-27 ENCOUNTER — Ambulatory Visit (INDEPENDENT_AMBULATORY_CARE_PROVIDER_SITE_OTHER): Payer: Medicare Other | Admitting: Family

## 2019-02-27 VITALS — BP 142/88 | HR 82 | Temp 98.1°F | Wt 186.8 lb

## 2019-02-27 DIAGNOSIS — G4733 Obstructive sleep apnea (adult) (pediatric): Secondary | ICD-10-CM | POA: Diagnosis not present

## 2019-02-27 DIAGNOSIS — F324 Major depressive disorder, single episode, in partial remission: Secondary | ICD-10-CM

## 2019-02-27 DIAGNOSIS — F419 Anxiety disorder, unspecified: Secondary | ICD-10-CM

## 2019-02-27 DIAGNOSIS — E119 Type 2 diabetes mellitus without complications: Secondary | ICD-10-CM

## 2019-02-27 DIAGNOSIS — I10 Essential (primary) hypertension: Secondary | ICD-10-CM

## 2019-02-27 MED ORDER — TRAZODONE HCL 50 MG PO TABS: 100.0000 mg | ORAL_TABLET | Freq: Every day | ORAL | 1 refills | Status: DC

## 2019-02-27 NOTE — Patient Instructions (Addendum)
Please wear cipap machine EVERY NIGHT.   Trial of trazodone 100mg  every night.   Let me know if you need anything

## 2019-02-27 NOTE — Assessment & Plan Note (Signed)
Slightly elevated today, will monitor at home and let me know if persistent

## 2019-02-27 NOTE — Progress Notes (Signed)
Subjective:    Patient ID: Melanie Bird, female    DOB: March 11, 1941, 78 y.o.   MRN: [id number]  CC: Melanie Bird is a 78 y.o. female who presents today for follow up.   HPI: Accompanied by daughter.   Feels stressed, crying more often. Has to have BL cataract surgery , feels overwhelmed by doctors appointments.  Some stress between herself and her daughter.  Sleeping well taking trazodone 1 tablet every night. Good appetite.   Right knee pain- mobic daily with 'a lot of help.' ; has seen orthopedics 12/2018 and had an injection.   HTN-compliant medication.  No chest pain, shortness of breath  OSA- not wearing cipap.   DM- on metformin.    h/o parathyroidectomy.  HISTORY:  Past Medical History:  Diagnosis Date   Anemia    GERD (gastroesophageal reflux disease)    Hypertension    History reviewed. No pertinent surgical history. Family History  Problem Relation Age of Onset   Diabetes Father    Hypertension Sister    Breast cancer Neg Hx     Allergies: Patient has no known allergies. Current Outpatient Medications on File Prior to Visit  Medication Sig Dispense Refill   albuterol (PROVENTIL HFA;VENTOLIN HFA) 108 (90 Base) MCG/ACT inhaler Inhale 2 puffs into the lungs every 6 (six) hours as needed for wheezing or shortness of breath. 1 Inhaler 2   alendronate (FOSAMAX) 70 MG tablet Take by mouth.     amLODipine (NORVASC) 5 MG tablet Take 1 tablet (5 mg total) by mouth daily. 90 tablet 1   FeFum-FePo-FA-B Cmp-C-Zn-Mn-Cu (SE-TAN PLUS) 162-115.2-1 MG CAPS Take by mouth.     latanoprost (XALATAN) 0.005 % ophthalmic solution Place 1 drop into both eyes at bedtime.     meloxicam (MOBIC) 7.5 MG tablet TAKE 1 TABLET (7.5 MG TOTAL) BY MOUTH DAILY AS NEEDED FOR PAIN (SEVERE PAIN). 30 tablet 0   metFORMIN (GLUCOPHAGE XR) 500 MG 24 hr tablet Start 500mg  PO qpm. 90 tablet 3   metoprolol tartrate (LOPRESSOR) 25 MG tablet TAKE 1 TABLET BY MOUTH TWICE A DAY 180 tablet 1     omeprazole (PRILOSEC) 10 MG capsule Take 10 mg by mouth daily.     No current facility-administered medications on file prior to visit.     Social History   Tobacco Use   Smoking status: Never Smoker   Smokeless tobacco: Never Used  Substance Use Topics   Alcohol use: No   Drug use: No    Review of Systems  Constitutional: Negative for chills and fever.  Respiratory: Negative for cough.   Cardiovascular: Negative for chest pain and palpitations.  Gastrointestinal: Negative for nausea and vomiting.  Psychiatric/Behavioral: Negative for sleep disturbance. The patient is nervous/anxious.       Objective:    BP (!) 142/88 (BP Location: Left Arm, Patient Position: Sitting, Cuff Size: Large)    Pulse 82    Temp 98.1 F (36.7 C)    Wt 186 lb 12.8 oz (84.7 kg)    SpO2 96%    BMI 37.73 kg/m  BP Readings from Last 3 Encounters:  02/27/19 (!) 142/88  11/28/18 140/80  10/29/18 140/80   Wt Readings from Last 3 Encounters:  02/27/19 186 lb 12.8 oz (84.7 kg)  11/28/18 194 lb 6.4 oz (88.2 kg)  10/29/18 196 lb (88.9 kg)    Physical Exam Vitals signs reviewed.  Constitutional:      Appearance: She is well-developed.  Eyes:  Conjunctiva/sclera: Conjunctivae normal.  Cardiovascular:     Rate and Rhythm: Normal rate and regular rhythm.     Pulses: Normal pulses.     Heart sounds: Normal heart sounds.  Pulmonary:     Effort: Pulmonary effort is normal.     Breath sounds: Normal breath sounds. No wheezing, rhonchi or rales.  Skin:    General: Skin is warm and dry.  Neurological:     Mental Status: She is alert.  Psychiatric:        Speech: Speech normal.        Behavior: Behavior normal.        Thought Content: Thought content normal.        Assessment & Plan:   Problem List Items Addressed This Visit      Cardiovascular and Mediastinum   HTN (hypertension)    Slightly elevated today, will monitor at home and let me know if persistent        Respiratory    OSA (obstructive sleep apnea)    Discussed risks of not treating sleep apnea, particularly as it relates to fatigue, increased risk of stroke, heart attack.  Patient agreed to start wearing CPAP again.        Endocrine   Diabetes mellitus without complication ([place])   Relevant Orders   Hemoglobin A1c     Other   Depression, major, single episode, in partial remission [facility]) - Primary    Patient appears overwhelmed, which is understandable considering everything going on.  Jointly agreed that she has been doing well on the trazodone and first step is to increase.  Follow-up in a couple months to see if symptoms have improved.  She declines counseling referral today.      Relevant Medications   traZODone (DESYREL) 50 MG tablet   Other Relevant Orders   CBC with Differential/Platelet   Comprehensive metabolic panel       I have changed Melanie Bird's traZODone. I am also having her maintain her omeprazole, alendronate, Se-Tan PLUS, albuterol, metFORMIN, metoprolol tartrate, amLODipine, meloxicam, and latanoprost.   Meds ordered this encounter  Medications   traZODone (DESYREL) 50 MG tablet    Sig: Take 2 tablets (100 mg total) by mouth at bedtime.    Dispense:  90 tablet    Refill:  1    Order Specific Question:   Supervising Provider    Answer:   Crecencio Mc [[id number]]    Return precautions given.   Risks, benefits, and alternatives of the medications and treatment plan prescribed today were discussed, and patient expressed understanding.   Education regarding symptom management and diagnosis given to patient on AVS.  Continue to follow with Melanie Hawthorne, FNP for routine health maintenance.   Melanie Bird and I agreed with plan.   Melanie Paris, FNP  I spent 25 min face to face w/ pt. Of which greater than 50% of time was spent in discussion of overall health, treatment for depression, anxiety, including counseling, pharmacological measures.

## 2019-02-27 NOTE — Assessment & Plan Note (Signed)
Discussed risks of not treating sleep apnea, particularly as it relates to fatigue, increased risk of stroke, heart attack.  Patient agreed to start wearing CPAP again.

## 2019-02-27 NOTE — Assessment & Plan Note (Signed)
Patient appears overwhelmed, which is understandable considering everything going on.  Jointly agreed that she has been doing well on the trazodone and first step is to increase.  Follow-up in a couple months to see if symptoms have improved.  She declines counseling referral today.

## 2019-02-28 LAB — CBC WITH DIFFERENTIAL/PLATELET
Basophils Absolute: 0.1 10*3/uL (ref 0.0–0.1)
Basophils Relative: 0.8 % (ref 0.0–3.0)
Eosinophils Absolute: 0.1 10*3/uL (ref 0.0–0.7)
Eosinophils Relative: 0.5 % (ref 0.0–5.0)
HCT: 44.4 % (ref 36.0–46.0)
Hemoglobin: 14.5 g/dL (ref 12.0–15.0)
Lymphocytes Relative: 17.8 % (ref 12.0–46.0)
Lymphs Abs: 2.1 10*3/uL (ref 0.7–4.0)
MCHC: 32.6 g/dL (ref 30.0–36.0)
MCV: 84.8 fl (ref 78.0–100.0)
Monocytes Absolute: 0.9 10*3/uL (ref 0.1–1.0)
Monocytes Relative: 8 % (ref 3.0–12.0)
Neutro Abs: 8.5 10*3/uL — ABNORMAL HIGH (ref 1.4–7.7)
Neutrophils Relative %: 72.9 % (ref 43.0–77.0)
Platelets: 314 10*3/uL (ref 150.0–400.0)
RBC: 5.23 Mil/uL — ABNORMAL HIGH (ref 3.87–5.11)
RDW: 18.2 % — AB (ref 11.5–15.5)
WBC: 11.7 10*3/uL — ABNORMAL HIGH (ref 4.0–10.5)

## 2019-02-28 LAB — COMPREHENSIVE METABOLIC PANEL
ALT: 17 U/L (ref 0–35)
AST: 21 U/L (ref 0–37)
Albumin: 4.3 g/dL (ref 3.5–5.2)
Alkaline Phosphatase: 77 U/L (ref 39–117)
BUN: 12 mg/dL (ref 6–23)
CHLORIDE: 105 meq/L (ref 96–112)
CO2: 25 mEq/L (ref 19–32)
Calcium: 9.7 mg/dL (ref 8.4–10.5)
Creatinine, Ser: 0.84 mg/dL (ref 0.40–1.20)
GFR: 65.21 mL/min (ref >60.00)
GLUCOSE: 109 mg/dL — AB (ref 70–99)
Potassium: 4 mEq/L (ref 3.5–5.1)
Sodium: 143 mEq/L (ref 135–145)
Total Bilirubin: 0.4 mg/dL (ref 0.2–1.2)
Total Protein: 7.4 g/dL (ref 6.0–8.3)

## 2019-02-28 LAB — HEMOGLOBIN A1C: Hgb A1c MFr Bld: 7.2 % — ABNORMAL HIGH (ref 4.6–6.5)

## 2019-03-04 ENCOUNTER — Other Ambulatory Visit: Payer: Self-pay | Admitting: Family

## 2019-03-04 DIAGNOSIS — D729 Disorder of white blood cells, unspecified: Secondary | ICD-10-CM

## 2019-03-04 NOTE — Progress Notes (Signed)
Tried to call patient again & LM to call back.

## 2019-03-14 ENCOUNTER — Other Ambulatory Visit: Payer: Self-pay | Admitting: Family

## 2019-03-14 DIAGNOSIS — G8929 Other chronic pain: Secondary | ICD-10-CM

## 2019-03-14 DIAGNOSIS — M25561 Pain in right knee: Principal | ICD-10-CM

## 2019-04-14 ENCOUNTER — Other Ambulatory Visit: Payer: Self-pay | Admitting: Family

## 2019-04-14 DIAGNOSIS — G8929 Other chronic pain: Secondary | ICD-10-CM

## 2019-04-14 DIAGNOSIS — M25561 Pain in right knee: Secondary | ICD-10-CM

## 2019-04-14 DIAGNOSIS — R7989 Other specified abnormal findings of blood chemistry: Secondary | ICD-10-CM

## 2019-04-15 MED ORDER — DICLOFENAC SODIUM 1 % TD GEL: 4.0000 g | Freq: Four times a day (QID) | TRANSDERMAL | 3 refills | Status: DC

## 2019-04-15 NOTE — Telephone Encounter (Signed)
Call pt  I to follow-up after her cataract surgery earlier this  Month.   I have placed hematology referral  In regards to mobic for her knee pain; does she feel she would benefit from orthopedic referral as well?  Sometimes injections can be very helpful.    Additionally make sure that she understands I will prescribed her meloxicam as a as needed versus daily medication.  Meloxicam can affect her kidney function as well as called GI ulcers; having more control if she used for more as needed and severe pain days  I have also sent in a gel form of an NSAID, Voltaren gel.  I like for her to try this as well in hopes that she could use the meloxicam less   Advise ICE ICE ICE

## 2019-04-15 NOTE — Telephone Encounter (Signed)
Last OV 02/27/2019  Last refilled 03/14/2019 disp 30 with no refills   Next OV 05/01/2019  Sent to PCP for approval

## 2019-04-26 IMAGING — MG MM DIGITAL SCREENING BILAT W/ TOMO W/ CAD
8 series · 8 of 24 positions shown · non-contrast
Comparison: None.

CLINICAL DATA: Screening.

EXAM:
DIGITAL SCREENING BILATERAL MAMMOGRAM WITH TOMO AND CAD

[R CC synth-2D]
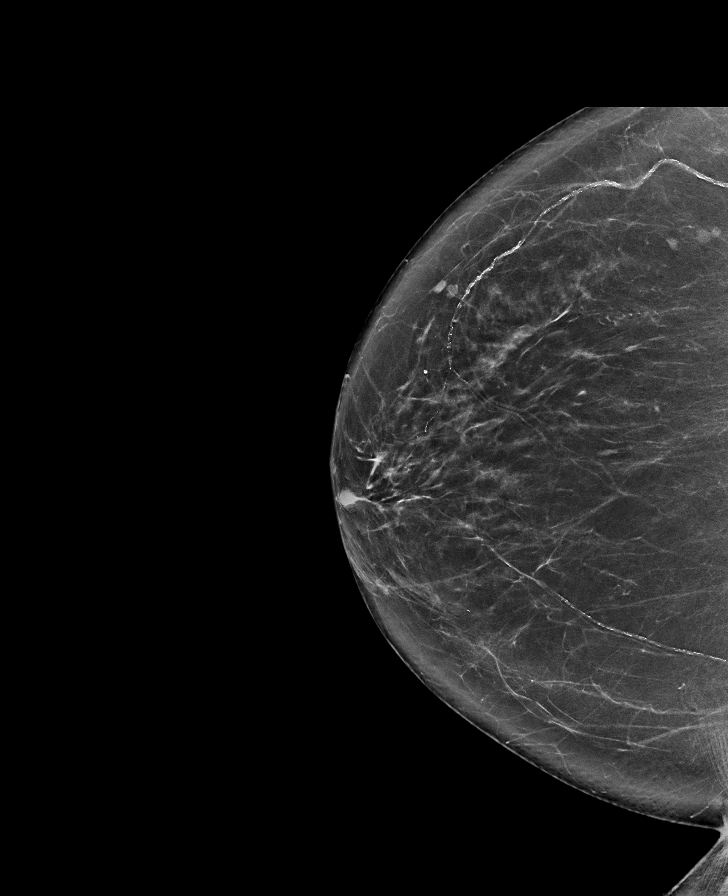

[R MLO synth-2D]
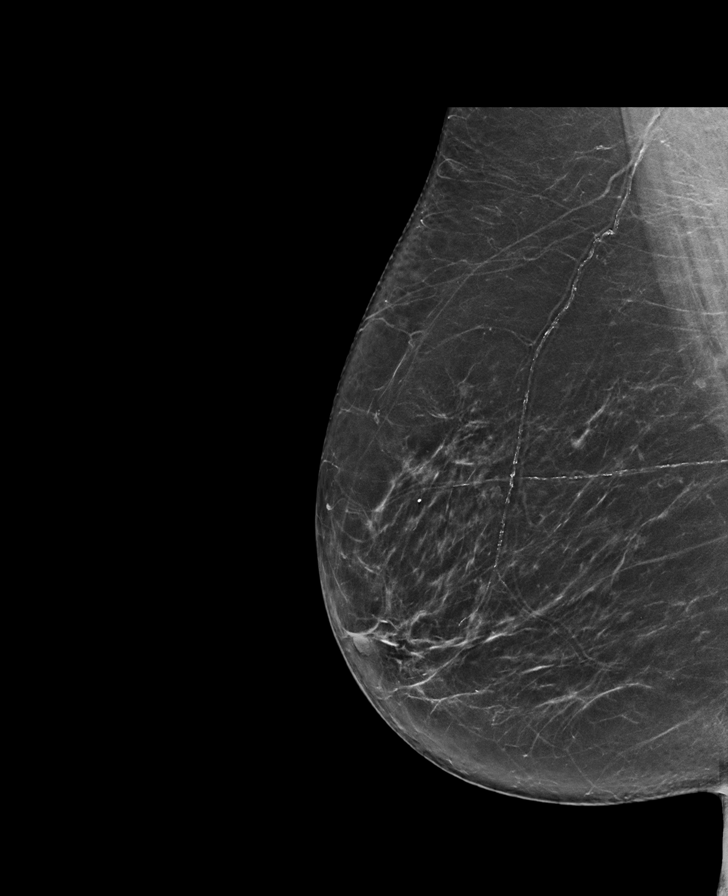

[L CC synth-2D]
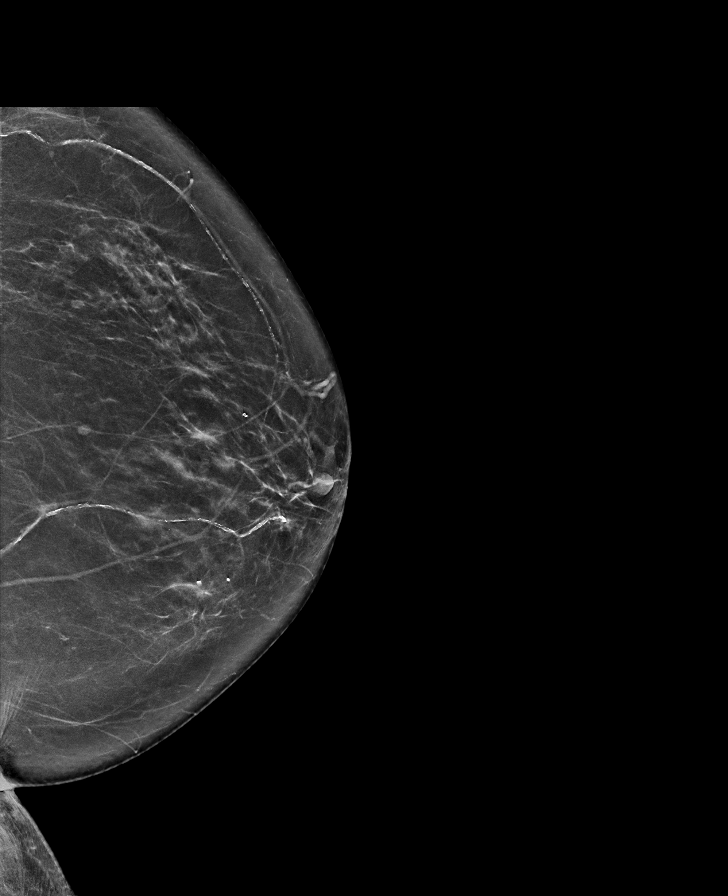

[L MLO synth-2D]
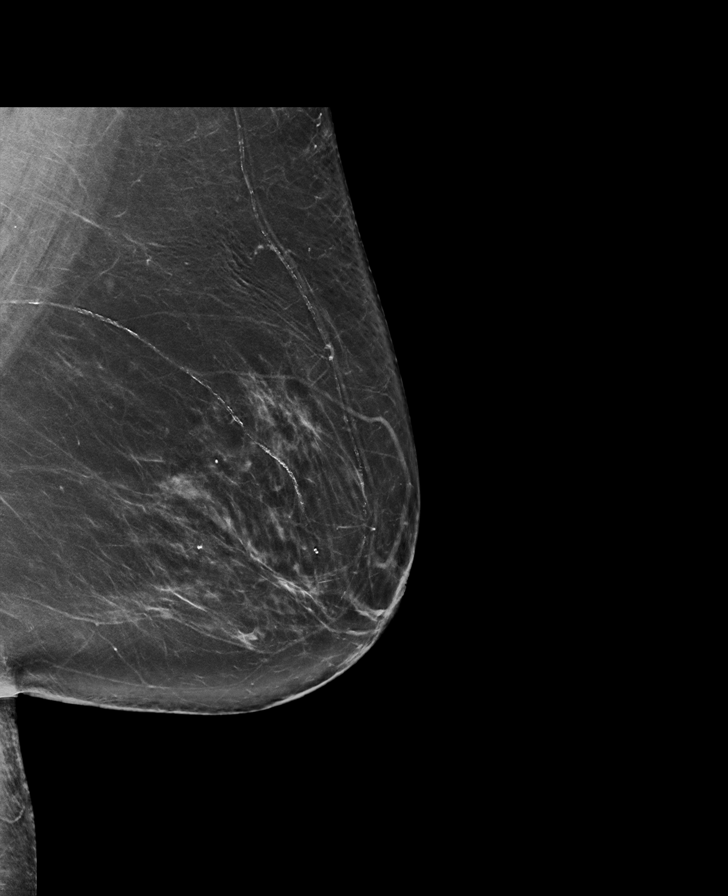

[R CC tomo · tomo slice 40/79.0]
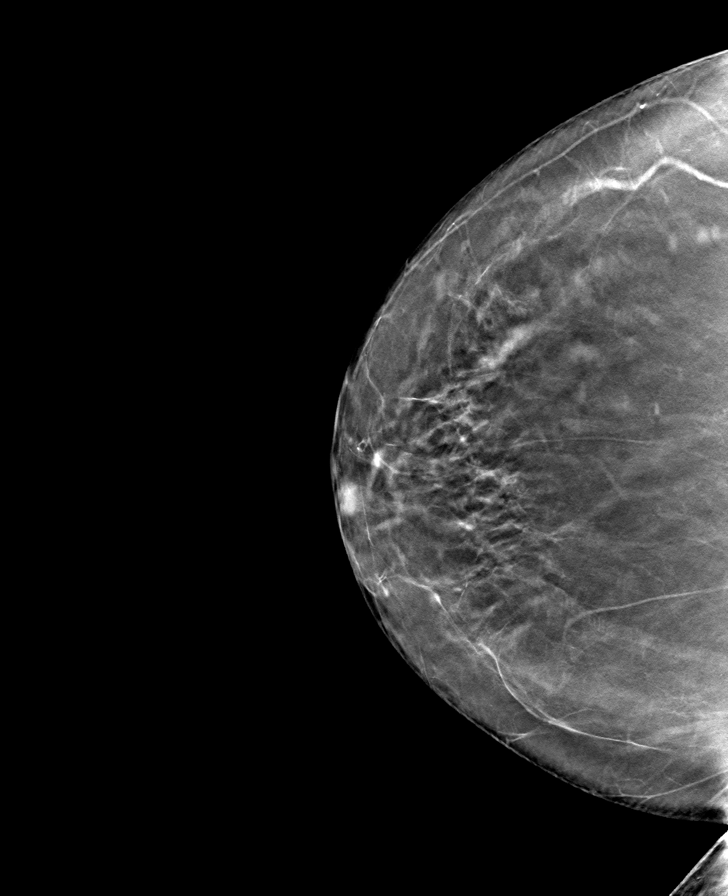

[L MLO tomo · tomo slice 42/83.0]
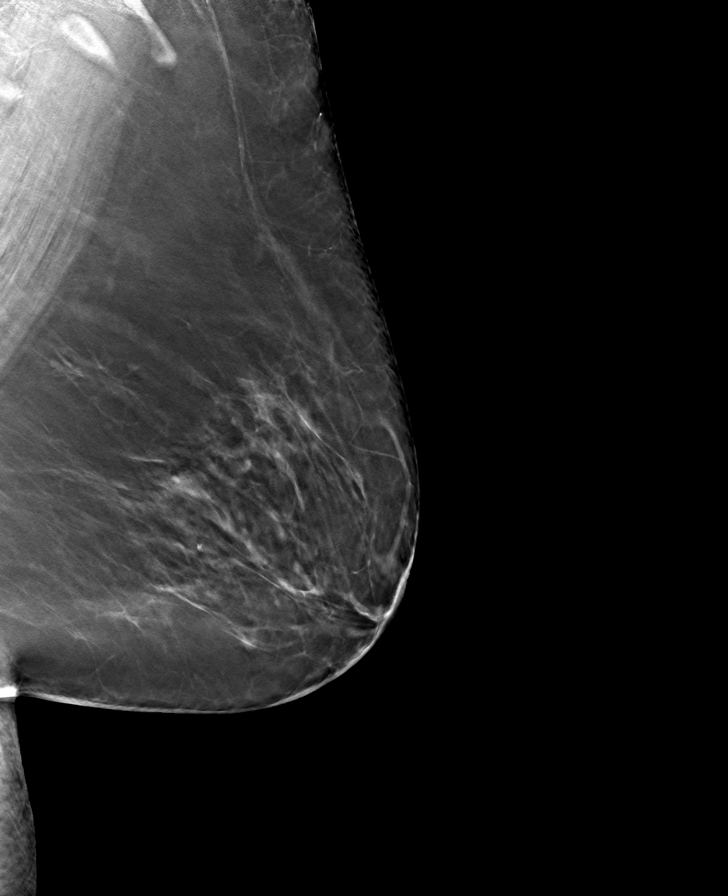

[L CC tomo · tomo slice 36/71.0]
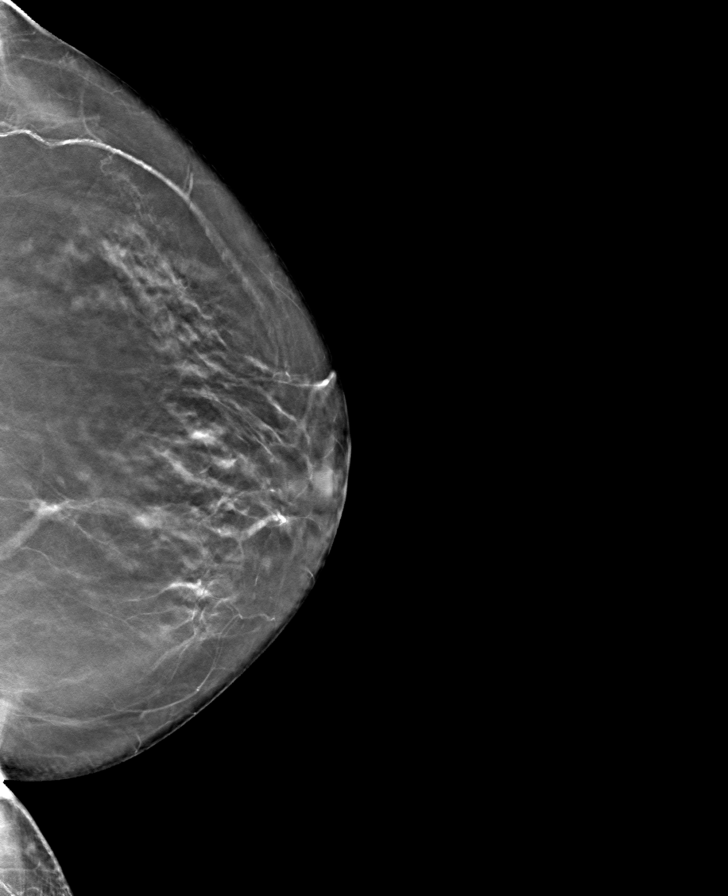

[R MLO tomo · tomo slice 39/77.0]
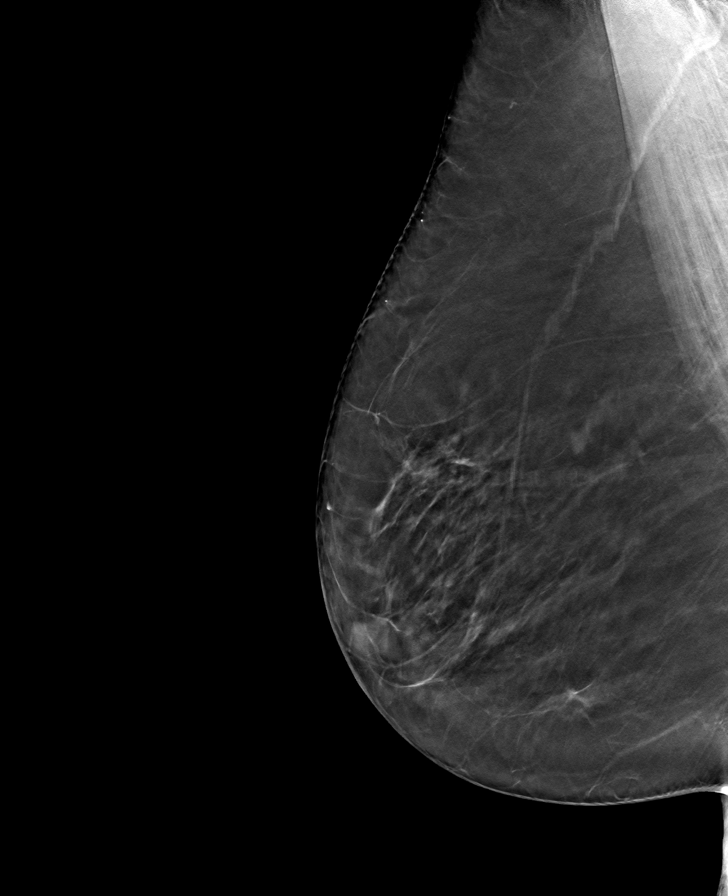

[8 of 24 positions shown; findings below may reference images not displayed]

ACR Breast Density Category b: There are scattered areas of
fibroglandular density.
FINDINGS: There are no findings suspicious for malignancy. Images were
processed with CAD.
IMPRESSION: No mammographic evidence of malignancy. A result letter of this
screening mammogram will be mailed directly to the patient.

RECOMMENDATION:
Screening mammogram in one year. (Code:Y5-G-EJ6)

BI-RADS CATEGORY  1: Negative.

## 2019-05-01 ENCOUNTER — Ambulatory Visit (INDEPENDENT_AMBULATORY_CARE_PROVIDER_SITE_OTHER): Payer: Medicare Other | Admitting: Family

## 2019-05-01 ENCOUNTER — Other Ambulatory Visit: Payer: Self-pay

## 2019-05-01 ENCOUNTER — Encounter: Payer: Self-pay | Admitting: Family

## 2019-05-01 ENCOUNTER — Other Ambulatory Visit: Payer: Self-pay | Admitting: Family

## 2019-05-01 DIAGNOSIS — I10 Essential (primary) hypertension: Secondary | ICD-10-CM | POA: Diagnosis not present

## 2019-05-01 DIAGNOSIS — G4733 Obstructive sleep apnea (adult) (pediatric): Secondary | ICD-10-CM | POA: Diagnosis not present

## 2019-05-01 DIAGNOSIS — D72829 Elevated white blood cell count, unspecified: Secondary | ICD-10-CM

## 2019-05-01 DIAGNOSIS — F324 Major depressive disorder, single episode, in partial remission: Secondary | ICD-10-CM

## 2019-05-01 NOTE — Assessment & Plan Note (Signed)
Improved on trazodone. Will continue.

## 2019-05-01 NOTE — Progress Notes (Signed)
This visit type was conducted due to national recommendations for restrictions regarding the COVID-19 pandemic (e.g. social distancing).  This format is felt to be most appropriate for this patient at this time.  All issues noted in this document were discussed and addressed.  No physical exam was performed (except for noted visual exam findings with Video Visits). Virtual Visit via Video Note  I connected with@  on 05/01/19 at  1:30 PM EDT by a video enabled telemedicine application and verified that I am speaking with the correct person using two identifiers.  Location patient: home Location provider:work Persons participating in the virtual visit: patient, provider  I discussed the limitations of evaluation and management by telemedicine and the availability of in person appointments. The patient expressed understanding and agreed to proceed.  Interactive audio and video telecommunications were attempted between this provider and patient, however failed, due to patient having technical difficulties or patient did not have access to video capability.  We continued and completed visit with audio only.    HPI:  Has been staying home. Daughter has been bringing groceries.  Feels well today. No new complaints  Depression- feels much better on trazodone. Feels happy. No si/hi.   Elevated WBCs- had deferred heme/onc referral 2 months ago.   Insomnia- sleeping well on increased trazodone , 100mg .   Right knee pain- Improved. takes mobic with relief, using daily. Takes tramadol 50mg  prn when knee pain is worse.   HTN- compliant with medications. Doesn't recall BP readings. No CP, SOB.   OSA- using intermittently cipap machine now  DM- compliant with metformin   ROS: See pertinent positives and negatives per HPI.  Past Medical History:  Diagnosis Date  . Anemia   . GERD (gastroesophageal reflux disease)   . Hypertension     History reviewed. No pertinent surgical  history.  Family History  Problem Relation Age of Onset  . Diabetes Father   . Hypertension Sister   . Breast cancer Neg Hx     SOCIAL HX: never smoker   Current Outpatient Medications:  .  albuterol (PROVENTIL HFA;VENTOLIN HFA) 108 (90 Base) MCG/ACT inhaler, Inhale 2 puffs into the lungs every 6 (six) hours as needed for wheezing or shortness of breath., Disp: 1 Inhaler, Rfl: 2 .  alendronate (FOSAMAX) 70 MG tablet, Take by mouth., Disp: , Rfl:  .  amLODipine (NORVASC) 5 MG tablet, Take 1 tablet (5 mg total) by mouth daily., Disp: 90 tablet, Rfl: 1 .  diclofenac sodium (VOLTAREN) 1 % GEL, Apply 4 g topically 4 (four) times daily., Disp: 1 Tube, Rfl: 3 .  FeFum-FePo-FA-B Cmp-C-Zn-Mn-Cu (SE-TAN PLUS) 162-115.2-1 MG CAPS, Take by mouth., Disp: , Rfl:  .  latanoprost (XALATAN) 0.005 % ophthalmic solution, Place 1 drop into both eyes at bedtime., Disp: , Rfl:  .  meloxicam (MOBIC) 7.5 MG tablet, TAKE 1 TABLET (7.5 MG TOTAL) BY MOUTH DAILY AS NEEDED FOR PAIN (SEVERE PAIN)., Disp: 30 tablet, Rfl: 1 .  metFORMIN (GLUCOPHAGE XR) 500 MG 24 hr tablet, Start 500mg  PO qpm., Disp: 90 tablet, Rfl: 3 .  metoprolol tartrate (LOPRESSOR) 25 MG tablet, TAKE 1 TABLET BY MOUTH TWICE A DAY, Disp: 180 tablet, Rfl: 1 .  omeprazole (PRILOSEC) 10 MG capsule, Take 10 mg by mouth daily., Disp: , Rfl:  .  traMADol (ULTRAM) 50 MG tablet, Take 50 mg by mouth daily as needed for moderate pain., Disp: , Rfl:  .  traZODone (DESYREL) 50 MG tablet, Take 2 tablets (100 mg total)  by mouth at bedtime., Disp: 90 tablet, Rfl: 1   ASSESSMENT AND PLAN:  Discussed the following assessment and plan:  Essential hypertension  OSA (obstructive sleep apnea)  Leukocytosis, unspecified type  Depression, major, single episode, in partial remission ([place])  Problem List Items Addressed This Visit      Cardiovascular and Mediastinum   HTN (hypertension)    BP Readings from Last 3 Encounters:  02/27/19 (!) 142/88  11/28/18  140/80  10/29/18 140/80   No recent readings. Patient will send me readings from home.         Respiratory   OSA (obstructive sleep apnea)    Advised again of risks of untreated OSA. She verbalized understanding.         Other   Depression, major, single episode, in partial remission ([place])    Improved on trazodone. Will continue.       Elevated WBC count    Persistent. Declines referral to hematology or repeat CBC. Will follow.            I discussed the assessment and treatment plan with the patient. The patient was provided an opportunity to ask questions and all were answered. The patient agreed with the plan and demonstrated an understanding of the instructions.   The patient was advised to call back or seek an in-person evaluation if the symptoms worsen or if the condition fails to improve as anticipated.   Mable Paris, FNP   I spent 25 min non face to face w/ pt.

## 2019-05-01 NOTE — Assessment & Plan Note (Signed)
Advised again of risks of untreated OSA. She verbalized understanding.

## 2019-05-01 NOTE — Assessment & Plan Note (Addendum)
BP Readings from Last 3 Encounters:  02/27/19 (!) 142/88  11/28/18 140/80  10/29/18 140/80   No recent readings. Patient will send me readings from home.

## 2019-05-01 NOTE — Patient Instructions (Addendum)
Such a pleasure speaking with you  Again, important for you to use Cipap machine EVERY NIGHT as we discussed.  Monitor blood pressure,  Goal is less than 120/80, based on newest guidelines; if persistently higher, please make sooner follow up appointment so we can recheck you blood pressure and manage medications  Please consider a referral to hematology as your white blood cells remain elevated.   Let me know how you are doing.   Stay safe!

## 2019-05-01 NOTE — Assessment & Plan Note (Addendum)
Persistent. Declines referral to hematology or repeat CBC. Will follow.

## 2019-05-02 NOTE — Progress Notes (Signed)
AVS mailed

## 2019-05-06 ENCOUNTER — Other Ambulatory Visit: Payer: Self-pay | Admitting: Family

## 2019-05-06 ENCOUNTER — Telehealth: Payer: Self-pay | Admitting: Family

## 2019-05-06 DIAGNOSIS — D729 Disorder of white blood cells, unspecified: Secondary | ICD-10-CM

## 2019-05-06 NOTE — Telephone Encounter (Signed)
Call pt  Advise her I placed ref to hematology for elevated wbc  Let us know she does not need to call in regards to this; likely will be virtual  Make one month f/u for BP Advise her to take a couple times a week at home, goal 130/80

## 2019-05-06 NOTE — Telephone Encounter (Signed)
I called patient & LM that mucinex was okay for her to take. I asked that she call back if she has any further questions.

## 2019-05-06 NOTE — Telephone Encounter (Signed)
Call pt Advise plain mucinex okay

## 2019-05-06 NOTE — Progress Notes (Signed)
Patient sitting BP 130/90 pulse could not give, Patient is willing to see hematology  For WBC . Patient prefers not to have an appointment since they are calling for rain.

## 2019-05-06 NOTE — Progress Notes (Signed)
Called Pt today No answer, left a VM. Will try back later.

## 2019-05-06 NOTE — Telephone Encounter (Signed)
LMTCB to inform patient on below.

## 2019-05-07 ENCOUNTER — Telehealth: Payer: Self-pay | Admitting: Lab

## 2019-05-07 NOTE — Telephone Encounter (Signed)
Called Pt  No answer left a VM to call office.  Was calling Pt to get BP readings also to tell her that NP Mable Paris would like her to consider a referral to Hematology in regards to her abnormal labs.  Will try back later.

## 2019-05-07 NOTE — Telephone Encounter (Signed)
-----   Message from Burnard Hawthorne, [place] sent at 05/01/2019  2:05 PM EDT ----- Call pt  Would you call pt and get her to BP ( ensure resting for 10 mins, feet on the floor etc).  Advise again that she consider hematology referral ( discussed today 05/01/19) in regards to abnormal labs.

## 2019-05-08 NOTE — Telephone Encounter (Signed)
Called Pt on Monday and Tuesday and could not reach her. Called Pt and spoke with her today about her BP readings Pt stated her BP was 130/90 for Monday, Tuesday, and Wednesday. I asked her was she sure it was the same all 3 days and she stated yes.  Noted the above, Geneva Barrero. np In prior note, she was agreeable to hematology and has been placed.

## 2019-05-08 NOTE — Progress Notes (Signed)
Called Pt on Monday and Tuesday and could not reach her. Called Pt and spoke with her today about her BP readings Pt stated her BP was 130/90 for Monday, Tuesday, and Wednesday. I asked her was she sure it was the same all 3 days and she stated yes.

## 2019-05-15 ENCOUNTER — Telehealth: Payer: Self-pay | Admitting: Internal Medicine

## 2019-05-16 ENCOUNTER — Inpatient Hospital Stay: Payer: Medicare Other | Admitting: Internal Medicine

## 2019-05-24 ENCOUNTER — Other Ambulatory Visit: Payer: Self-pay | Admitting: Family

## 2019-05-24 DIAGNOSIS — I10 Essential (primary) hypertension: Secondary | ICD-10-CM

## 2019-05-27 ENCOUNTER — Other Ambulatory Visit: Payer: Self-pay

## 2019-05-28 ENCOUNTER — Telehealth: Payer: Self-pay | Admitting: Internal Medicine

## 2019-05-28 ENCOUNTER — Inpatient Hospital Stay: Payer: Medicare Other | Admitting: Internal Medicine

## 2019-05-28 NOTE — Progress Notes (Unsigned)
I connected with Marjory Lies on 05/28/19 at  1:00 PM EDT by {Blank single:19197::"video enabled telemedicine visit","telephone visit"} and verified that I am speaking with the correct person using two identifiers.  I discussed the limitations, risks, security and privacy concerns of performing an evaluation and management service by telemedicine and the availability of in-person appointments. I also discussed with the patient that there may be a patient responsible charge related to this service. The patient expressed understanding and agreed to proceed.    Other persons participating in the visit and their role in the encounter: ***  Patient's location: ***  Provider's location: ***    No history exists.     Chief Complaint: ***    History of present illness:Melanie Bird 78 y.o.  female with history of   Observation/objective:  Assessment and plan: No problem-specific Assessment & Plan notes found for this encounter.    Follow-up instructions:  I discussed the assessment and treatment plan with the patient.  The patient was provided an opportunity to ask questions and all were answered.  The patient agreed with the plan and demonstrated understanding of instructions.  The patient was advised to call back or seek an in person evaluation if the symptoms worsen or if the condition fails to improve as anticipated.  I provided *** minutes of {Blank single:19197::"face-to-face video visit time","non face-to-face telephone visit time"} during this encounter, and > 50% was spent counseling as documented under my assessment & plan.   Dr. Charlaine Dalton [facility] at [facility] 05/28/2019 12:14 PM

## 2019-05-28 NOTE — Telephone Encounter (Signed)
Pt cannot be reached for her appts today.   Colette- please re-schedule Virtual visit with me to 1 month from now. Thx

## 2019-06-07 ENCOUNTER — Ambulatory Visit: Payer: Medicare Other | Admitting: Family

## 2019-06-13 ENCOUNTER — Other Ambulatory Visit: Payer: Self-pay | Admitting: Family

## 2019-06-13 DIAGNOSIS — M25561 Pain in right knee: Secondary | ICD-10-CM

## 2019-06-13 DIAGNOSIS — G8929 Other chronic pain: Secondary | ICD-10-CM

## 2019-06-25 ENCOUNTER — Inpatient Hospital Stay: Payer: Medicare Other | Attending: Internal Medicine | Admitting: Internal Medicine

## 2019-06-25 DIAGNOSIS — D509 Iron deficiency anemia, unspecified: Secondary | ICD-10-CM | POA: Diagnosis not present

## 2019-06-25 NOTE — Progress Notes (Signed)
Patient verified using two identifiers for virtual visit via telephone today.    

## 2019-06-25 NOTE — Progress Notes (Signed)
I connected with Melanie Bird on 06/25/19 at  1:30 PM EDT by telephone visit and verified that I am speaking with the correct person using two identifiers.  I discussed the limitations, risks, security and privacy concerns of performing an evaluation and management service by telemedicine and the availability of in-person appointments. I also discussed with the patient that there may be a patient responsible charge related to this service. The patient expressed understanding and agreed to proceed.    Other persons participating in the visit and their role in the encounter: RN/medical reconciliation Patient's location: Home Provider's location: Office  Oncology History   No history exists.     Chief Complaint: Iron deficient anemia   History of present illness:Melanie Bird 78 y.o.  female with history of iron deficient anemia is here for follow-up.  Patient appetite is good.  No weight loss but denies any blood in stools or black or stools.  No blood in urine.  She continues to been p.o. iron once a day.  She is awaiting to have cataract surgery.   Observation/objective: See below  Assessment and plan: Iron deficiency anemia due to chronic blood loss # Chronic-unclear etiology question. No obvious GI blood loss.  Hemoglobin is 13.  Stable for the last 4 years.  Continue p.o. iron.  # Elevated Calcium- 10.8 ? Etiology primary hyperparathyroidism.  #Regarding follow-up patient has had stable hemoglobin for the last 4 years.  No concerns for any worsening anemia.  Recommend follow-up with PCP;  in follow-up with [place] only as needed.  Patient agreement.  #  DISPOSITION:  #  Follow up as needed- Dr.B  Cc; Ms. Vidal Schwalbe    Follow-up instructions:  I discussed the assessment and treatment plan with the patient.  The patient was provided an opportunity to ask questions and all were answered.  The patient agreed with the plan and demonstrated understanding of instructions.  The  patient was advised to call back or seek an in person evaluation if the symptoms worsen or if the condition fails to improve as anticipated.  I provided 12 minutes of non face-to-face telephone visit time during this encounter, and > 50% was spent counseling as documented under my assessment & plan.  Dr. Charlaine Dalton [place] at [facility] 06/25/2019 1:49 PM

## 2019-06-25 NOTE — Assessment & Plan Note (Addendum)
#   Chronic-unclear etiology question. No obvious GI blood loss.  Hemoglobin is 13.  Stable for the last 4 years.  Continue p.o. iron.  # Elevated Calcium- 10.8 ? Etiology primary hyperparathyroidism.  #Regarding follow-up patient has had stable hemoglobin for the last 4 years.  No concerns for any worsening anemia.  Recommend follow-up with PCP;  in follow-up with [place] only as needed.  Patient agreement.  #  DISPOSITION:  #  Follow up as needed- Dr.B  Cc; Ms. Vidal Schwalbe

## 2019-06-26 ENCOUNTER — Encounter: Payer: Self-pay | Admitting: Family

## 2019-06-26 ENCOUNTER — Ambulatory Visit (INDEPENDENT_AMBULATORY_CARE_PROVIDER_SITE_OTHER): Payer: Medicare Other | Admitting: Family

## 2019-06-26 ENCOUNTER — Other Ambulatory Visit: Payer: Self-pay

## 2019-06-26 DIAGNOSIS — M810 Age-related osteoporosis without current pathological fracture: Secondary | ICD-10-CM | POA: Diagnosis not present

## 2019-06-26 DIAGNOSIS — F324 Major depressive disorder, single episode, in partial remission: Secondary | ICD-10-CM | POA: Diagnosis not present

## 2019-06-26 DIAGNOSIS — I10 Essential (primary) hypertension: Secondary | ICD-10-CM

## 2019-06-26 DIAGNOSIS — E210 Primary hyperparathyroidism: Secondary | ICD-10-CM | POA: Diagnosis not present

## 2019-06-26 DIAGNOSIS — Z1239 Encounter for other screening for malignant neoplasm of breast: Secondary | ICD-10-CM | POA: Diagnosis not present

## 2019-06-26 DIAGNOSIS — E119 Type 2 diabetes mellitus without complications: Secondary | ICD-10-CM

## 2019-06-26 NOTE — Assessment & Plan Note (Signed)
Pending follow up with endocrine as well. Continue fosamax.

## 2019-06-26 NOTE — Assessment & Plan Note (Signed)
Lost to follow up to Dr Gabriel Carina.

## 2019-06-26 NOTE — Assessment & Plan Note (Addendum)
BP Readings from Last 3 Encounters:  02/27/19 (!) 142/88  11/28/18 140/80  10/29/18 140/80   Somewhat elevated in prior visits. However values at home are better than in office. Will continue current regimen for now.

## 2019-06-26 NOTE — Progress Notes (Signed)
Verbal consent for services obtained from patient prior to services given.  Location of call:  provider at work patient at home  Names of all persons present for services: Mable Paris, NP  Chief complaint:  Doing well today. No complaints.    Insomnia and depression- Feels well on regimen. sleeping better and taking trazodone 100mg .   IDA- has seen Dr Rogue Bussing yesterday. Felt stable. Continue iron.  Advised f/u pcp.  HTN- at home last month 130/90. Compliant with medication. No CP.   Osteoporosis- on fosamax. No problems with swallowing. Last dexa 2019.   History, background, results pertinent:   H/o parathyroid surgery 09/2018.   A/P/next steps:  Problem List Items Addressed This Visit      Cardiovascular and Mediastinum   HTN (hypertension)    BP Readings from Last 3 Encounters:  02/27/19 (!) 142/88  11/28/18 140/80  10/29/18 140/80   Somewhat elevated in prior visits. However values at home are better than in office. Will continue current regimen for now.       Relevant Orders   Comprehensive metabolic panel     Endocrine   Primary hyperparathyroidism ([place])    Lost to follow up to Dr Gabriel Carina.       Diabetes mellitus without complication ([place]) - Primary    Pending a1c, urine.       Relevant Orders   Hemoglobin A1c   Microalbumin / creatinine urine ratio     Musculoskeletal and Integument   Osteoporosis    Pending follow up with endocrine as well. Continue fosamax.         Other   Depression, major, single episode, in partial remission ([place])    Stable. Continue regimen.        Other Visit Diagnoses    Screening for breast cancer       Relevant Orders   MM 3D SCREEN BREAST BILATERAL      I spent 25 min  discussing plan of care over the phone.

## 2019-06-26 NOTE — Patient Instructions (Signed)
Labs  We placed a referral for mammogram this year. I asked that you call one the below locations and schedule this when it is convenient for you.   Options for Melanie Bird  [place], [place]   We will make follow up appointment as you need to see Dr Gabriel Carina after surgery last year.   Stay safe!

## 2019-06-26 NOTE — Assessment & Plan Note (Signed)
Stable. Continue regimen. 

## 2019-06-26 NOTE — Assessment & Plan Note (Signed)
Pending a1c, urine.

## 2019-06-27 ENCOUNTER — Encounter: Payer: Self-pay | Admitting: Family

## 2019-06-28 ENCOUNTER — Telehealth: Payer: Self-pay | Admitting: Family

## 2019-06-28 NOTE — Telephone Encounter (Signed)
Okay Please make ONE hour appt for them

## 2019-06-28 NOTE — Telephone Encounter (Signed)
Call daughter to get more information  You may schedule follow up for patient and we can discuss formal memory evaluation or referral to neurology for evaluation

## 2019-06-28 NOTE — Telephone Encounter (Signed)
Pt daughter called and stated that her mother flipped out on her and her brother  last night. Is having memory issue  Please contact pt daughter Lanelle Bal or son Berneta Sages at [phone]

## 2019-07-01 NOTE — Telephone Encounter (Signed)
Left message for patient to return call back. PEC may give information.  

## 2019-07-03 ENCOUNTER — Telehealth: Payer: Self-pay | Admitting: Family

## 2019-07-03 NOTE — Telephone Encounter (Signed)
Pt calling with concerns regarding letter received to schedule mammogram. Asking "Is something wrong, I don't remember this discussed."   Reviewed letter with patient to her satisfaction, routine mammogram as due.  Pt also questioning who scheduled appt for her 07/08/2019 and what it is for, stating "I'm having problems with my son and daughter. I did not provide any information regarding upcoming appt., just stated follow up appt. Pt with multiple questions.  Pt requests [phone]

## 2019-07-04 ENCOUNTER — Other Ambulatory Visit: Payer: Self-pay

## 2019-07-08 ENCOUNTER — Other Ambulatory Visit: Payer: Self-pay

## 2019-07-08 ENCOUNTER — Telehealth: Payer: Self-pay | Admitting: Family

## 2019-07-08 ENCOUNTER — Encounter: Payer: Self-pay | Admitting: Family

## 2019-07-08 ENCOUNTER — Ambulatory Visit (INDEPENDENT_AMBULATORY_CARE_PROVIDER_SITE_OTHER): Payer: Medicare Other | Admitting: Family

## 2019-07-08 DIAGNOSIS — F324 Major depressive disorder, single episode, in partial remission: Secondary | ICD-10-CM | POA: Diagnosis not present

## 2019-07-08 DIAGNOSIS — I10 Essential (primary) hypertension: Secondary | ICD-10-CM

## 2019-07-08 NOTE — Telephone Encounter (Signed)
Call pt  I respect she declines safe check to ensure she is safe at home.  Please emphasize that I just want to ensure that she is safe  If she ever feels someone in her home or ringing her door bell; please call the police so they can come to property.   Please ask if she would have a virtual f/u next week so I can see how she is.

## 2019-07-08 NOTE — Patient Instructions (Addendum)
I want you to be safe.  I would like to evaluate in the emergency room to ensure no infectious etiology playing a role in your symptoms.    If you change your mind, please let me know.

## 2019-07-08 NOTE — Telephone Encounter (Signed)
Melanie Bird,   I spoke with Dr Nicolasa Ducking ( psychiatry) and I am concerned that she may have an infection such as urinary. She declined treatment today  Can we call 911 to check on patient at home? I would like to see if she would voluntarily go to ED for further evaluation.   Other option is safe check from police to ensure she is safe.   Please keep me informed

## 2019-07-08 NOTE — Telephone Encounter (Signed)
Patient has appointment in office today 07/08/19.

## 2019-07-08 NOTE — Progress Notes (Signed)
Subjective:    Patient ID: Melanie Bird, female    DOB: 08-14-1941, 78 y.o.   MRN: [id number]  CC: Melanie Bird is a 78 y.o. female who presents today for follow up.   HPI: Accompanied by daughter today  No new concerns today and feels well.   Her daughter shares concerns with mother hearing her speak in an upstairs bedroom over several months, unchanged. Daughter states she has moved out a couple of weeks ago.   Patient lives alone in home. She does her grocery shopping and last week took her taxes to her CPA to be filed. She dresses herself. She pays her bills and states hasnt gotten behind in her financials.   She doesn't have concerns of urinary tract infection. No dysuria, urinary frequency, hematuria, foul smell of urine.   She does feel there is a neighbor who has been ringing her door bell and not following a 'no trespassing sign.'  She describes the noise in daughters room as perhaps conversation. She doesn't feel that her phone conversations are being listened too. She has h/o bilateral hearing loss. No tinnitus, ear pain.   She doesn't have visual hallucinations.  She doesn't feel depressed or anxious. She does feel frustrated by her children at this time. She has no h/o dementia, bipolar or schizophrenia in family or personal history.   Depression- feels well on trazodone. Sleeping very well.  No thoughts of hurting herself or anyone else. Feels very close to the [place] and has very strong faith. She states there is an unloaded gun in the home however she doesn't know how to even 'shoot a gun' and the bullets are stored separately.   HTN- compliant with medication. Denies exertional chest pain or pressure, numbness or tingling radiating to left arm or jaw, palpitations, dizziness, frequent headaches, changes in vision, or shortness of breath.   Dm- compliant with metformin    HISTORY:  Past Medical History:  Diagnosis Date  . Anemia   . GERD (gastroesophageal reflux  disease)   . Hypertension    Past Surgical History:  Procedure Laterality Date  . PARATHYROIDECTOMY     09/2018   Family History  Problem Relation Age of Onset  . Diabetes Father   . Hypertension Sister   . Breast cancer Neg Hx     Allergies: Patient has no known allergies. Current Outpatient Medications on File Prior to Visit  Medication Sig Dispense Refill  . albuterol (PROVENTIL HFA;VENTOLIN HFA) 108 (90 Base) MCG/ACT inhaler Inhale 2 puffs into the lungs every 6 (six) hours as needed for wheezing or shortness of breath. 1 Inhaler 2  . alendronate (FOSAMAX) 70 MG tablet Take by mouth.    Marland Kitchen amLODipine (NORVASC) 5 MG tablet TAKE 1 TABLET BY MOUTH EVERY DAY 90 tablet 1  . diclofenac sodium (VOLTAREN) 1 % GEL Apply 4 g topically 4 (four) times daily. 1 Tube 3  . FeFum-FePo-FA-B Cmp-C-Zn-Mn-Cu (SE-TAN PLUS) 162-115.2-1 MG CAPS Take by mouth.    . latanoprost (XALATAN) 0.005 % ophthalmic solution Place 1 drop into both eyes at bedtime.    . meloxicam (MOBIC) 7.5 MG tablet TAKE 1 TABLET (7.5 MG TOTAL) BY MOUTH DAILY AS NEEDED FOR PAIN (SEVERE PAIN). 30 tablet 1  . metFORMIN (GLUCOPHAGE XR) 500 MG 24 hr tablet Start 500mg  PO qpm. 90 tablet 3  . metoprolol tartrate (LOPRESSOR) 25 MG tablet TAKE 1 TABLET BY MOUTH TWICE A DAY 180 tablet 1  . omeprazole (PRILOSEC) 10 MG capsule  Take 10 mg by mouth daily.    . traMADol (ULTRAM) 50 MG tablet Take 50 mg by mouth daily as needed for moderate pain.    . traZODone (DESYREL) 50 MG tablet TAKE 2 TABLETS (100 MG TOTAL) BY MOUTH AT BEDTIME. 180 tablet 1   No current facility-administered medications on file prior to visit.     Social History   Tobacco Use  . Smoking status: Never Smoker  . Smokeless tobacco: Never Used  Substance Use Topics  . Alcohol use: No  . Drug use: No    Review of Systems  Constitutional: Negative for chills and fever.  HENT: Positive for hearing loss.   Eyes: Negative for visual disturbance.  Respiratory:  Negative for cough.   Cardiovascular: Negative for chest pain and palpitations.  Gastrointestinal: Negative for nausea and vomiting.  Genitourinary: Negative for difficulty urinating, dysuria and urgency.  Neurological: Negative for headaches.  Psychiatric/Behavioral: Negative for self-injury, sleep disturbance and suicidal ideas.      Objective:    BP 122/88   Pulse 62   Temp 98.5 F (36.9 C)   Wt 175 lb (79.4 kg)   SpO2 95%   BMI 35.35 kg/m  BP Readings from Last 3 Encounters:  07/08/19 122/88  02/27/19 (!) 142/88  11/28/18 140/80   Wt Readings from Last 3 Encounters:  07/08/19 175 lb (79.4 kg)  02/27/19 186 lb 12.8 oz (84.7 kg)  11/28/18 194 lb 6.4 oz (88.2 kg)    Physical Exam Vitals signs reviewed.  Constitutional:      Appearance: She is well-developed.  Eyes:     Conjunctiva/sclera: Conjunctivae normal.  Cardiovascular:     Rate and Rhythm: Normal rate and regular rhythm.     Pulses: Normal pulses.     Heart sounds: Normal heart sounds.  Pulmonary:     Effort: Pulmonary effort is normal.     Breath sounds: Normal breath sounds. No wheezing, rhonchi or rales.  Skin:    General: Skin is warm and dry.  Neurological:     Mental Status: She is alert.     Comments: Well dressed. Clean.  Alert and oriented x 3.     Psychiatric:        Attention and Perception: She does not perceive auditory or visual hallucinations.        Mood and Affect: Affect is labile.        Speech: Speech normal.        Behavior: Behavior normal.        Thought Content: Thought content normal.     Comments: Expressed frustratation towards daughter and being in a 'bad relationship'.         Assessment & Plan:   Problem List Items Addressed This Visit      Cardiovascular and Mediastinum   HTN (hypertension)    Well controlled. Continue current regimen.         Other   Depression, major, single episode, in partial remission ([place])    Overall seems controlled. Advised to  continue trazodone.  Patient exibited executive function today and appears well groomed, oriented. She did express anger with her daughter but never verbally or physically threatened her in my presence.  She has h/o hearing loss and advised patient to have MRI brain, auditory evaluation, urine studies today in the setting of discussion of hearing her daughter or another electronic upstairs. She is adament that she feels well and has no health concerns today. She doesn't feel her behaviors  are interfering with her life nor does she exhibit unsafe behavior at this time.  She declines having any tests done in our office. I advised her to go to the emergency department for prompt evaluation ( MRI, urine) however patient declines and understands the risk of not seeking an evaluation.  I even advised a psychiatric evaluation to ensure that depression is not playing a role. She declines as well.  She was clear that she would call me if she had any concerns and wanted to move forward with further testing. I advised that we would continue to follow up with her and call to check on her. I also advised that she call the police if she believes someone to ring her doorbell or certainly hears any sounds inside so they can check the property.  At this time, I do not feel patient is unsafe nor were her daughters concerns acute to suggest delirium though with patient age nonetheless advised evaluation. Her symptoms appear to be over several months, and the patient-daughter relationship did appear stressed.  I will continue to emphasize that I would like to further evaluate patient.          I am having Puako Knarr maintain her omeprazole, alendronate, Se-Tan PLUS, albuterol, metFORMIN, latanoprost, diclofenac sodium, traMADol, traZODone, metoprolol tartrate, amLODipine, and meloxicam.   No orders of the defined types were placed in this encounter.   Return precautions given.   Risks, benefits, and  alternatives of the medications and treatment plan prescribed today were discussed, and patient expressed understanding.   Education regarding symptom management and diagnosis given to patient on AVS.  Continue to follow with Burnard Hawthorne, FNP for routine health maintenance.   Marjory Lies and I agreed with plan.   Mable Paris, FNP

## 2019-07-09 NOTE — Progress Notes (Signed)
I spoke with pharmacist at [facility] & patient's metformin was not recalled. They did have any of the lot numbers or brand that was recalled.

## 2019-07-12 ENCOUNTER — Encounter: Payer: Self-pay | Admitting: Family

## 2019-07-12 NOTE — Assessment & Plan Note (Signed)
Well-controlled.  Continue current regimen. 

## 2019-07-12 NOTE — Telephone Encounter (Signed)
Thank you sarah.   Juliann Pulse,   How can we arrange a social work evaluation? I would like patient to agree - I presume she would need to agree for them to come to her home?

## 2019-07-12 NOTE — Telephone Encounter (Signed)
I will call Social Services as discussed and have in home evaluation.

## 2019-07-12 NOTE — Assessment & Plan Note (Addendum)
Overall seems controlled. Advised to continue trazodone.  Patient exibited executive function today and appears well groomed, oriented. She did express anger with her daughter but never verbally or physically threatened her in my presence.  She has h/o hearing loss and advised patient to have MRI brain, auditory evaluation, urine studies today in the setting of discussion of hearing her daughter or another electronic upstairs. She is adament that she feels well and has no health concerns today. She doesn't feel her behaviors are interfering with her life nor does she exhibit unsafe behavior at this time.  She declines having any tests done in our office. I advised her to go to the emergency department for prompt evaluation ( MRI, urine) however patient declines and understands the risk of not seeking an evaluation.  I even advised a psychiatric evaluation to ensure that depression is not playing a role. She declines as well.  She was clear that she would call me if she had any concerns and wanted to move forward with further testing. I advised that we would continue to follow up with her and call to check on her. I also advised that she call the police if she believes someone to ring her doorbell or certainly hears any sounds inside so they can check the property.  At this time, I do not feel patient is unsafe nor were her daughters concerns acute to suggest delirium though with patient age nonetheless advised evaluation. Her symptoms appear to be over several months, and the patient-daughter relationship did appear stressed.  I will continue to emphasize that I would like to further evaluate patient.

## 2019-07-15 NOTE — Telephone Encounter (Signed)
Thank you Juliann Pulse Keep [place] posted

## 2019-07-16 NOTE — Telephone Encounter (Signed)
[place] left message for a call back to check on how initiate a well visit to patient for safety check.

## 2019-08-02 NOTE — Telephone Encounter (Signed)
Any update here on well visit check?

## 2019-08-05 DIAGNOSIS — H2512 Age-related nuclear cataract, left eye: Secondary | ICD-10-CM | POA: Diagnosis not present

## 2019-08-06 DIAGNOSIS — H2511 Age-related nuclear cataract, right eye: Secondary | ICD-10-CM | POA: Diagnosis not present

## 2019-08-07 ENCOUNTER — Telehealth: Payer: Self-pay | Admitting: Family

## 2019-08-07 NOTE — Telephone Encounter (Signed)
FYI Jessica  Please have [facility] call this patient on her return from vacation 08/08/19. She knows patient well and I would prefer her to call.   Judson Roch,   I have spoken with Dr Derrel Nip and she is happy to see patient in my absence with maternity leave if she is agreeable.   I would like Dr Derrel Nip to see her in person ( so it would have to be an Tangent appointment) to ensure patient is safe and doing well particularly after our last visit.   She needs f/u and labs for HTN, DM as well that day. If she would like to have her daughter or son come with her, that is fine too.   Perhaps schedule something for one month out- mid to late September?

## 2019-08-08 ENCOUNTER — Other Ambulatory Visit: Payer: Self-pay | Admitting: Family

## 2019-08-08 DIAGNOSIS — E119 Type 2 diabetes mellitus without complications: Secondary | ICD-10-CM

## 2019-08-08 DIAGNOSIS — G8929 Other chronic pain: Secondary | ICD-10-CM

## 2019-08-08 NOTE — Telephone Encounter (Signed)
Left  Second message for Social services office to return call.

## 2019-08-09 NOTE — Telephone Encounter (Signed)
Normal crt 02/2019.   Refilled mobic

## 2019-08-09 NOTE — Telephone Encounter (Signed)
Okay to refill Meloxicam? It was last sent in on 06/17/19 for #30 and 1 refill.  Pt is scheduled to see Dr. Derrel Nip in September for HBP.

## 2019-08-09 NOTE — Telephone Encounter (Signed)
Noted Thanks for reaching out to patient

## 2019-08-23 ENCOUNTER — Other Ambulatory Visit: Payer: Self-pay | Admitting: Family

## 2019-08-23 DIAGNOSIS — G8929 Other chronic pain: Secondary | ICD-10-CM

## 2019-08-23 DIAGNOSIS — F324 Major depressive disorder, single episode, in partial remission: Secondary | ICD-10-CM

## 2019-08-23 NOTE — Telephone Encounter (Signed)
Ok to fill 

## 2019-09-02 DIAGNOSIS — H2511 Age-related nuclear cataract, right eye: Secondary | ICD-10-CM | POA: Diagnosis not present

## 2019-09-13 ENCOUNTER — Ambulatory Visit: Payer: Medicare Other | Admitting: Internal Medicine

## 2019-09-24 ENCOUNTER — Other Ambulatory Visit: Payer: Self-pay | Admitting: Family

## 2019-09-24 DIAGNOSIS — I10 Essential (primary) hypertension: Secondary | ICD-10-CM

## 2019-10-02 DIAGNOSIS — Z23 Encounter for immunization: Secondary | ICD-10-CM | POA: Diagnosis not present

## 2019-10-09 ENCOUNTER — Other Ambulatory Visit: Payer: Self-pay | Admitting: Internal Medicine

## 2019-10-09 DIAGNOSIS — F324 Major depressive disorder, single episode, in partial remission: Secondary | ICD-10-CM

## 2019-11-27 ENCOUNTER — Telehealth: Payer: Self-pay | Admitting: Family

## 2019-11-27 ENCOUNTER — Other Ambulatory Visit: Payer: Self-pay | Admitting: Internal Medicine

## 2019-11-27 DIAGNOSIS — M1990 Unspecified osteoarthritis, unspecified site: Secondary | ICD-10-CM

## 2019-11-27 DIAGNOSIS — M069 Rheumatoid arthritis, unspecified: Secondary | ICD-10-CM

## 2019-11-27 MED ORDER — DICLOFENAC SODIUM 1 % EX GEL: 2.0000 g | Freq: Four times a day (QID) | CUTANEOUS | 2 refills | Status: DC

## 2019-11-27 NOTE — Telephone Encounter (Signed)
Medication Refill - Medication: diclofenac sodium (VOLTAREN) 1 % GEL  Has the patient contacted their pharmacy? Yes.   (Agent: If no, request that the patient contact the pharmacy for the refill.) (Agent: If yes, when and what did the pharmacy advise?)  Preferred Pharmacy (with phone number or street name): [id number] - [place], [place] [address]  Agent: Please be advised that RX refills may take up to 3 business days. We ask that you follow-up with your pharmacy.

## 2019-12-05 ENCOUNTER — Other Ambulatory Visit: Payer: Self-pay | Admitting: Family

## 2019-12-05 DIAGNOSIS — G8929 Other chronic pain: Secondary | ICD-10-CM

## 2019-12-05 DIAGNOSIS — M25561 Pain in right knee: Secondary | ICD-10-CM

## 2019-12-05 NOTE — Telephone Encounter (Signed)
Call pt Please see how she is doing sch f/u appt and labs

## 2019-12-06 NOTE — Telephone Encounter (Signed)
I called and spoke with patient & she wanted to wait until after the new year to schedule appointment. She stated that she was doing well besides having her stove, air/heat, fridge & microwave all go out this year. She also is afraid if Covid & does not like to get bc of this. I told her we could schedule a telephone visit for January & I have done so. We will schedule labs after speaking with her & coordinating with daughter.

## 2019-12-18 ENCOUNTER — Ambulatory Visit: Payer: Medicare Other | Admitting: Family

## 2020-01-13 ENCOUNTER — Encounter: Payer: Self-pay | Admitting: Family

## 2020-01-13 ENCOUNTER — Other Ambulatory Visit: Payer: Self-pay

## 2020-01-13 ENCOUNTER — Ambulatory Visit (INDEPENDENT_AMBULATORY_CARE_PROVIDER_SITE_OTHER): Payer: Medicare PPO | Admitting: Family

## 2020-01-13 DIAGNOSIS — E119 Type 2 diabetes mellitus without complications: Secondary | ICD-10-CM | POA: Diagnosis not present

## 2020-01-13 DIAGNOSIS — I10 Essential (primary) hypertension: Secondary | ICD-10-CM | POA: Diagnosis not present

## 2020-01-13 DIAGNOSIS — M810 Age-related osteoporosis without current pathological fracture: Secondary | ICD-10-CM | POA: Diagnosis not present

## 2020-01-13 DIAGNOSIS — M25561 Pain in right knee: Secondary | ICD-10-CM

## 2020-01-13 DIAGNOSIS — F324 Major depressive disorder, single episode, in partial remission: Secondary | ICD-10-CM | POA: Diagnosis not present

## 2020-01-13 DIAGNOSIS — G8929 Other chronic pain: Secondary | ICD-10-CM

## 2020-01-13 NOTE — Progress Notes (Signed)
Verbal consent for services obtained from patient prior to services given to TELEPHONE visit:   Location of call:  provider at work patient at home  Names of all persons present for services: Mable Paris, NP Chief complaint:   HTN- Compliant with medications.Denies exertional chest pain or pressure, numbness or tingling radiating to left arm or jaw, palpitations, dizziness, frequent headaches, changes in vision, or shortness of breath.   DM- compliant with medication.   Depression- doing well on trazodone. Sleeping well.   Osteoporosis- had been on fosamax. Doesn't appear on fosamax.   Using voltaren gel has been helping right knee pain. Using mobic prn. Especially when cold, rain will bother knees. If pain is bothering her at night, she will take tramadol at night.   Xray right knee showed mild degenerative disease 2018.  History, background, results pertinent:   Chronic hard of hearing  A/P/next steps:  Problem List Items Addressed This Visit      Cardiovascular and Mediastinum   HTN (hypertension)    BP Readings from Last 3 Encounters:  07/08/19 122/88  02/27/19 (!) 142/88  11/28/18 140/80   Patient unable to keep a log at home, historically she has been pretty well controlled.  She declines coming in the office due to Covid.  We will continue current regimen and she will come in for appt in 6 months        Endocrine   Diabetes mellitus without complication ([place])    Compliant with medication, again she declines coming in for routine lab work.  States she will do this and follow-up in several months due to Covid        Musculoskeletal and Integument   Osteoporosis    She does not appear to be on Fosamax anymore.  Pending making a f/u appt with Dr Gabriel Carina in regards to this.         Other   Chronic pain of right knee    Currently using tramadol as needed, meloxicam as needed and Voltaren gel more regularly.  Knee pain at baseline.  We will continue regimen for  now.  She  declines any imaging at this time      Depression, major, single episode, in partial remission [facility])    Patient very upbeat during our phone conversation today.  She is alert and oriented x4.  She is very appropriate on the phone.  We will continue trazodone.         I spent 21 min  discussing plan of care over the phone.

## 2020-01-13 NOTE — Assessment & Plan Note (Signed)
Patient very upbeat during our phone conversation today.  She is alert and oriented x4.  She is very appropriate on the phone.  We will continue trazodone.

## 2020-01-13 NOTE — Assessment & Plan Note (Signed)
Compliant with medication, again she declines coming in for routine lab work.  States she will do this and follow-up in several months due to Covid

## 2020-01-13 NOTE — Assessment & Plan Note (Signed)
She does not appear to be on Fosamax anymore.  Pending making a f/u appt with Dr Gabriel Carina in regards to this.

## 2020-01-13 NOTE — Assessment & Plan Note (Signed)
BP Readings from Last 3 Encounters:  07/08/19 122/88  02/27/19 (!) 142/88  11/28/18 140/80   Patient unable to keep a log at home, historically she has been pretty well controlled.  She declines coming in the office due to Covid.  We will continue current regimen and she will come in for appt in 6 months

## 2020-01-13 NOTE — Assessment & Plan Note (Signed)
Currently using tramadol as needed, meloxicam as needed and Voltaren gel more regularly.  Knee pain at baseline.  We will continue regimen for now.  She  declines any imaging at this time

## 2020-01-28 ENCOUNTER — Other Ambulatory Visit: Payer: Self-pay | Admitting: Family

## 2020-01-28 DIAGNOSIS — R062 Wheezing: Secondary | ICD-10-CM

## 2020-01-30 NOTE — Progress Notes (Signed)
I called and tried to reach patient on home phone but received no answer. I called daughter's cell which number is no longer correct. I called her daughters work & she was out sick. I wanted to advise that I had made appointment with  Dr. Gabriel Carina 04/06/20 @ 3:30p.

## 2020-02-05 ENCOUNTER — Other Ambulatory Visit: Payer: Self-pay | Admitting: Family

## 2020-02-06 NOTE — Progress Notes (Signed)
I spoke with patient who was unable to hear me well. I let her know that I had scheduled her to see Dr. Gabriel Carina in April. Patient was unsure why she saw this provider & I let her know that it was for her osteoporosis. Patient said that given all that was going in the world she did not wish to make any appointments at this time. I told patient that we could keep appointment scheduled since she may feel differently in the spring when weather has improved. I advised that if she did not want to see them in April & still felt the same that she could cancel appointment at that time.

## 2020-02-17 ENCOUNTER — Telehealth: Payer: Self-pay | Admitting: Family

## 2020-02-17 NOTE — Telephone Encounter (Signed)
I spoke with patient in great detail. She was very tearful & very much beside herself on what to do. She stated that so much had gone on she didn't even know where to start. She kept apologizing & saying that she sounds crazy. Patient stated once again that her daughter is leaving some types of recordings going on in her room when she is not there. They are very noisy & patient hears them constantly say " NO, NO, NO". She said that she has asked daughter repeatedly to turn these off. Daughter says that all the voices are in patient's head.   Over the weekend patient became so fearful that she called 911. Pt stated that 3 officers showed up & were talking to her daughter, Lanelle Bal on the porch & weren't really talking with her. She said that her daughter made out that she was just trying to take care of he mother. She said that they were trying to take her away someplace for for her to stay for 3 days in hand-cuffs. She said that her daughter talked them out of taking her, but she is afraid they are trying to put her away someplace.   Patient stated that her son was siding with he daughter & even recording her talking in living room. He also wanted to prove there were no voices coming from upstairs.   Patient really wanted to speak with you & asked if she did the right thing. I tried to comfort patient & tell her she did do the right thing if she feared for her life to call 911. She was agreeable to 1 hour telephone visit on Wednesday. I told her if she became afraid again & that her children or someone was trying to harm her that she should once again call 911.

## 2020-02-17 NOTE — Telephone Encounter (Signed)
Pt called and wanted to speak to Joycelyn Schmid about her kids trying to take over her medical care and she doesn't want this to happen  Call back [phone]

## 2020-02-19 ENCOUNTER — Ambulatory Visit (INDEPENDENT_AMBULATORY_CARE_PROVIDER_SITE_OTHER): Payer: Medicare PPO | Admitting: Family

## 2020-02-19 ENCOUNTER — Encounter: Payer: Self-pay | Admitting: Family

## 2020-02-19 ENCOUNTER — Telehealth: Payer: Self-pay | Admitting: Family

## 2020-02-19 DIAGNOSIS — M25561 Pain in right knee: Secondary | ICD-10-CM | POA: Diagnosis not present

## 2020-02-19 DIAGNOSIS — G8929 Other chronic pain: Secondary | ICD-10-CM

## 2020-02-19 DIAGNOSIS — I10 Essential (primary) hypertension: Secondary | ICD-10-CM

## 2020-02-19 DIAGNOSIS — R440 Auditory hallucinations: Secondary | ICD-10-CM | POA: Insufficient documentation

## 2020-02-19 DIAGNOSIS — F324 Major depressive disorder, single episode, in partial remission: Secondary | ICD-10-CM

## 2020-02-19 NOTE — Progress Notes (Signed)
Virtual Visit via TELEPHONE:   I connected with@  on 02/21/20 at  2:30 PM EST used telephone and verified that I am speaking with the correct person using two identifiers.  Location patient: home Location provider:work Persons participating in the virtual visit: patient, provider  I discussed the limitations of evaluation and management by telemedicine and the availability of in person appointments. The patient expressed understanding and agreed to proceed.   HPI: Follow up Feels well today.  Lives with daughter, Lanelle Bal. Daughter does all the shopping. Keeping up with finances.  No cp, sob.  The 'good lord is taking care of me.' She does worry about children and feels like they wont grow up. Continues to feel like she is hearing the radio, or recording, and thinks her daughter is playing this, however she is work currently today. She wants to know if this is in her head or real. She doesn't want her daughter to think she is crazy.  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 Doesn't listen to music loud. NO tinnitus, ear pain, hallucinations, HA, vision changes.   No falls . No depression.   Insomnia- Sleeping well on trazodone 100mg . No Si/hi. Feels safe at home.   HTN- BP machine is broken, so cannot to check. Doesn't want to leave with COVID. Denies exertional chest pain or pressure, numbness or tingling radiating to left arm or jaw, palpitations, dizziness, frequent headaches, changes in vision, or shortness of breath.   Right knee pain- bothers her in the cold , rain. Will take mobic and use voltaren with relief. Not sure about   Due MM, labs.    ROS: See pertinent positives and negatives per HPI.  Past Medical History:  Diagnosis Date  . Anemia   . GERD (gastroesophageal reflux disease)   . Hypertension     Past Surgical History:  Procedure Laterality Date  . PARATHYROIDECTOMY     09/2018    Family History  Problem  Relation Age of Onset  . Diabetes Father   . Hypertension Sister   . Breast cancer Neg Hx       Current Outpatient Medications:  .  diclofenac Sodium (VOLTAREN) 1 % GEL, Apply 2-4 g topically 4 (four) times daily. 2 grams upper body and 4 grams lower body, Disp: 100 g, Rfl: 2 .  FeFum-FePo-FA-B Cmp-C-Zn-Mn-Cu (SE-TAN PLUS) 162-115.2-1 MG CAPS, Take by mouth., Disp: , Rfl:  .  latanoprost (XALATAN) 0.005 % ophthalmic solution, Place 1 drop into both eyes at bedtime., Disp: , Rfl:  .  meloxicam (MOBIC) 7.5 MG tablet, TAKE 1 TABLET (7.5 MG TOTAL) BY MOUTH DAILY AS NEEDED FOR PAIN (SEVERE PAIN)., Disp: 30 tablet, Rfl: 2 .  metFORMIN (GLUCOPHAGE-XR) 500 MG 24 hr tablet, START 1 TABLET BY MOUTH DAILY EVERY EVENING, Disp: 90 tablet, Rfl: 3 .  metoprolol tartrate (LOPRESSOR) 25 MG tablet, TAKE 1 TABLET BY MOUTH TWICE A DAY, Disp: 180 tablet, Rfl: 1 .  omeprazole (PRILOSEC) 10 MG capsule, Take 10 mg by mouth daily., Disp: , Rfl:  .  traZODone (DESYREL) 50 MG tablet, TAKE 2 TABLETS (100 MG TOTAL) BY MOUTH AT BEDTIME., Disp: 180 tablet, Rfl: 1 .  VENTOLIN HFA 108 (90 Base) MCG/ACT inhaler, TAKE 2 PUFFS BY MOUTH EVERY 6 HOURS AS NEEDED FOR WHEEZE OR SHORTNESS OF BREATH, Disp: 18 g, Rfl: 2 .  amLODipine (NORVASC) 5 MG tablet, TAKE 1 TABLET BY MOUTH EVERY DAY, Disp: 90 tablet, Rfl: 1  EXAM:  VITALS per patient if applicable: Wt Readings from Last 3 Encounters:  02/19/20 170 lb (77.1 kg)  01/13/20 170 lb (77.1 kg)  07/08/19 175 lb (79.4 kg)   Temp Readings from Last 3 Encounters:  07/08/19 98.5 F (36.9 C)  02/27/19 98.1 F (36.7 C)  11/28/18 97.7 F (36.5 C)   BP Readings from Last 3 Encounters:  07/08/19 122/88  02/27/19 (!) 142/88  11/28/18 140/80   Pulse Readings from Last 3 Encounters:  07/08/19 62  02/27/19 82  11/28/18 81    GENERAL: alert, oriented, appears well and in no acute distress   PSYCH/NEURO: pleasant and cooperative, no obvious depression or anxiety, speech and  thought processing grossly intact.  ASSESSMENT AND PLAN:  Discussed the following assessment and plan:  Auditory hallucination - Plan: Ambulatory referral to Psychiatry  Essential hypertension  Chronic pain of right knee  Depression, major, single episode, in partial remission ([place]) Problem List Items Addressed This Visit      Cardiovascular and Mediastinum   HTN (hypertension)    Historically controlled; advised patient to keep a log at home so we can make changes if needed.         Other   Auditory hallucination    This does not appear acute, patient describes this symptom in the past.. As I discussed with patient, I am not sure if this is related to chronic hearing loss, or hallucination related to mental illness.  I advised the next appropriate step would be a consult with psychiatry, Dr. Nicolasa Ducking.  I have also sent her message in regards to this patient . I do not see reason to pursue acute delirium work-up at this time.    Of note, patient politely declines having mammogram or lab work done due to [employer].      Relevant Orders   Ambulatory referral to Psychiatry   Chronic pain of right knee    Chronic, unchanged.  She uses Mobic Voltaren gel as needed as needed.  Advised to take meloxicam with food and only as needed versus a daily medication      Depression, major, single episode, in partial remission [facility])    Patient denies any depression today.  No overt symptoms of depression during our phone call.  Trazodone appears quite helpful for her sleep         -we discussed possible serious and likely etiologies, options for evaluation and workup, limitations of telemedicine visit vs in person visit, treatment, treatment risks and precautions. Pt prefers to treat via telemedicine empirically rather then risking or undertaking an in person visit at this moment. Patient agrees to seek prompt in person care if worsening, new symptoms arise, or if is not improving with treatment.    I discussed the assessment and treatment plan with the patient. The patient was provided an opportunity to ask questions and all were answered. The patient agreed with the plan and demonstrated an understanding of the instructions.   The patient was advised to call back or seek an in-person evaluation if the symptoms worsen or if the condition fails to improve as anticipated.   Mable Paris, FNP

## 2020-02-19 NOTE — Patient Instructions (Addendum)
Nice to speak you to today.   I have placed a referral to psychiatry, Dr. Cephus Shelling.  She is simply fantastic.  I want you to speak with her in regards to what you are hearing.  Certainly if you feel having a hearing exam would be beneficial, we can make that the next step.  We will be calling you in regards to this referral.  Please call the office in the next week if you do not hear from [place] in regards to an appointment   Please call me with any concerns and stay safe.

## 2020-02-19 NOTE — Telephone Encounter (Signed)
Pt's son called and wanted to speak about his mother's health. I informed him b/c he wasn't on the DPR that I couldn't speak with him. He said he will have his sister call since she was on DPR.

## 2020-02-19 NOTE — Telephone Encounter (Signed)
Noted & I have made Mable Paris, FNP aware of this.

## 2020-02-20 ENCOUNTER — Telehealth: Payer: Self-pay

## 2020-02-20 NOTE — Telephone Encounter (Signed)
I called & spoke with patient's daughter. I let her know that patient should get a call about setting up an appointment with psychiatrist Dr. Nicolasa Ducking for auditory hallucinations. I explained that Joycelyn Schmid wanted to rule out anything psychological & go from there. 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

## 2020-02-20 NOTE — Telephone Encounter (Signed)
Pt daughter called to go over her mothers health and wanted to go over what was talked about yesterday at her appt

## 2020-02-20 NOTE — Telephone Encounter (Signed)
Phone note in error °

## 2020-02-21 ENCOUNTER — Other Ambulatory Visit: Payer: Self-pay | Admitting: Family

## 2020-02-21 DIAGNOSIS — I10 Essential (primary) hypertension: Secondary | ICD-10-CM

## 2020-02-21 NOTE — Assessment & Plan Note (Addendum)
Patient denies any depression today.  No overt symptoms of depression during our phone call.  Trazodone appears quite helpful for her sleep

## 2020-02-21 NOTE — Assessment & Plan Note (Signed)
Chronic, unchanged.  She uses Mobic Voltaren gel as needed as needed.  Advised to take meloxicam with food and only as needed versus a daily medication

## 2020-02-21 NOTE — Progress Notes (Signed)
Printed and mailed

## 2020-02-21 NOTE — Assessment & Plan Note (Addendum)
This does not appear acute, patient describes this symptom in the past.. As I discussed with patient, I am not sure if this is related to chronic hearing loss, or hallucination related to mental illness.  I advised the next appropriate step would be a consult with psychiatry, Dr. Nicolasa Ducking.  I have also sent her message in regards to this patient . I do not see reason to pursue acute delirium work-up at this time.    Of note, patient politely declines having mammogram or lab work done due to [employer].

## 2020-02-21 NOTE — Assessment & Plan Note (Signed)
Historically controlled; advised patient to keep a log at home so we can make changes if needed.

## 2020-02-26 ENCOUNTER — Telehealth: Payer: Self-pay | Admitting: Family

## 2020-02-26 NOTE — Telephone Encounter (Signed)
Call patient Looks like she may have denied psychiatric consult.  Can You asked for the reason for this?   When we last spoke, we both were in agreement in this regard.

## 2020-02-27 NOTE — Telephone Encounter (Signed)
I spoke with patient in great length & she denied referral for a number of reasons. She feels that she does not need referral because she IS NOT hearing things. These are recording that re bing played upstairs from her daughters room. She said that someday"s she doesn't hear them at all. She had not heard them today. Pt also feared that she would be put out of her home if she said too much. She did not want anyone locking her up & taking away her home. Pt also said that at this time she did not want to go out of her house alone. She didn't want to try parking her car someplace like [place] & someone "hit her over her head & do Lord knows what?". She said that she had been managing okay on her own. Patient went to on to say that she was sorry that when you spoke with her on the phone to you she lied & acted as if everything was okay. She said that it wasn't at the time & did not want anyone trying to lock her up or take her away. She said that both her daughter & son are trying to do this to her. She said that she the only one that could save her & her children was the good Lord at this point. She said that she was sorry for all the craziness that has transpired & that it just wasn't right.

## 2020-03-10 ENCOUNTER — Other Ambulatory Visit: Payer: Self-pay | Admitting: Family

## 2020-03-10 DIAGNOSIS — F324 Major depressive disorder, single episode, in partial remission: Secondary | ICD-10-CM

## 2020-03-10 MED ORDER — TRAZODONE HCL 50 MG PO TABS: ORAL_TABLET | ORAL | 1 refills | Status: DC

## 2020-03-10 NOTE — Addendum Note (Signed)
Addended by: Nanci Pina on: 03/10/2020 04:43 PM   Modules accepted: Orders

## 2020-03-10 NOTE — Telephone Encounter (Signed)
Pt needs a refill on traZODone (DESYREL) 50 MG tablet sent to [place]

## 2020-03-11 NOTE — Telephone Encounter (Signed)
Noted Melanie Bird, I want to follow this patient closely Can you call and sch f/u in 3-4 months?  Prefer in person if she is agreeable

## 2020-03-11 NOTE — Telephone Encounter (Signed)
Pt scheduled for 05/25/20 @ 3pm. She said this date is depending on her ride. I let her know if she doesn't have a ride we can do a telephone visit.

## 2020-03-12 NOTE — Telephone Encounter (Signed)
Medication sent in to preferred pharmacy per protocol.  03/10/20

## 2020-03-16 ENCOUNTER — Other Ambulatory Visit: Payer: Self-pay | Admitting: Family

## 2020-03-16 DIAGNOSIS — G8929 Other chronic pain: Secondary | ICD-10-CM

## 2020-05-25 ENCOUNTER — Encounter: Payer: Self-pay | Admitting: Family

## 2020-05-25 ENCOUNTER — Telehealth (INDEPENDENT_AMBULATORY_CARE_PROVIDER_SITE_OTHER): Payer: Medicare Other | Admitting: Family

## 2020-05-25 DIAGNOSIS — I10 Essential (primary) hypertension: Secondary | ICD-10-CM | POA: Diagnosis not present

## 2020-05-25 DIAGNOSIS — E119 Type 2 diabetes mellitus without complications: Secondary | ICD-10-CM | POA: Diagnosis not present

## 2020-05-25 DIAGNOSIS — M810 Age-related osteoporosis without current pathological fracture: Secondary | ICD-10-CM | POA: Diagnosis not present

## 2020-05-25 NOTE — Assessment & Plan Note (Signed)
Compliant with metformin. Overdue for urine protein, A1c, lipid panel.  Stressed the importance of this and patient was agreeable to come in the office in the next couple months

## 2020-05-25 NOTE — Assessment & Plan Note (Addendum)
Compliant with fosamax. Due dexa. She declines at this time. Will discuss again at follow up and work with her in regards to transportation via social work.

## 2020-05-25 NOTE — Assessment & Plan Note (Addendum)
Unable to assess whether under control.  Patient has declined in the office during Covid, and currently has a rental car complicated getting to office as relies on her daughter.  I stressed the importance of routine labs, blood pressure evaluation.  Patient is agreeable to having an appointment in person  in the next 2 months.

## 2020-05-25 NOTE — Progress Notes (Signed)
Verbal consent for services obtained from patient prior to services given to TELEPHONE visit:   Location of call:  provider at work patient at home  Names of all persons present for services: Mable Paris, NP and patient Chief complaint:  Follow up Doing well. No complaints today.   Stays in the house most of the time. Able to do cooking, house chores.  Lives at home. [facility] brings medications to the house.  Blood pressure machine is still broken , hasnt been able to take blood pressure.   Has also had trouble with cars with one car in the shop; daughter lives with her.  They are renting a car currently.  Sleeping well.   HTN- compliant with metoprolol and amlodipine. No CP, SOB.   Right leg pain- chronic. Takes mobic prn.   DM- compliant with metformin. Not on a cholesterol medication  GERD-no epigastric pain, Burning or regurgitation. Compliant with omeprazole.   Osteoporosis- compliant with fosamax. No trouble swallowing. Due dexa.  No falls.   Very overdue labs, in office visit for blood pressure.   History, background, results pertinent:    A/P/next steps: Problem List Items Addressed This Visit      Cardiovascular and Mediastinum   HTN (hypertension)    Unable to assess whether under control.  Patient has declined in the office during Covid, and currently has a rental car complicated getting to office as relies on her daughter.  I stressed the importance of routine labs, blood pressure evaluation.  Patient is agreeable to having an appointment in person  in the next 2 months.        Endocrine   Diabetes mellitus without complication ([place])    Compliant with metformin. Overdue for urine protein, A1c, lipid panel.  Stressed the importance of this and patient was agreeable to come in the office in the next couple months        Musculoskeletal and Integument   Osteoporosis    Compliant with fosamax. Due dexa. She declines at this time. Will discuss again at follow  up and work with her in regards to transportation via social work.       Relevant Medications   alendronate (FOSAMAX) 70 MG tablet       I spent 15 min  discussing plan of care over the phone.

## 2020-05-26 ENCOUNTER — Telehealth: Payer: Self-pay | Admitting: Family

## 2020-05-26 NOTE — Telephone Encounter (Signed)
Call patient After our telephone call yesterday, I wanted to speak with patient about a referral to help with regards to transportation in regards to make an appointments etc so that she does not have to rely solely on  daughter.  Would she like a referral for social work to help navigate this piece ?  If so, please place referral to [facility] referral (that REF 2100 order) and put transportation issues in notes of referral order

## 2020-05-26 NOTE — Telephone Encounter (Signed)
I called and spoke with the patient and asked if she would like the referral for transportation and she stated no because if her daughter can't take her, the son is her back up and it has been working so far but in the future should she needed this she would let us know.  Gwendolynn Merkey,cma

## 2020-05-29 ENCOUNTER — Other Ambulatory Visit: Payer: Self-pay | Admitting: Family

## 2020-05-29 DIAGNOSIS — G8929 Other chronic pain: Secondary | ICD-10-CM

## 2020-07-02 ENCOUNTER — Telehealth: Payer: Self-pay | Admitting: Family

## 2020-07-02 DIAGNOSIS — R440 Auditory hallucinations: Secondary | ICD-10-CM

## 2020-07-02 NOTE — Telephone Encounter (Signed)
Pt called today and said that she is hearing voice in her head and she is not talking

## 2020-07-02 NOTE — Telephone Encounter (Signed)
Patient is hering voices again. She would like to have a test done that can help eliminate this issues. Informed her that she declined referral to Dr Nicolasa Ducking. She stated she is ok with referral and wants these oditory hallucinations to stop. Patients call back number is [phone].

## 2020-07-07 NOTE — Telephone Encounter (Signed)
Call pt I have placed referral to psychiatry Please advise her to please take appointment once we have it scheduled  Does she want to sch a follow up here as well? I advise that she come in person so we can check BP and do labs.

## 2020-07-07 NOTE — Telephone Encounter (Signed)
Spoke with pt to let her know that the referral has been placed and that she needs to come in to the office for her appt on Friday. Pt gave a verbal understanding.

## 2020-07-07 NOTE — Telephone Encounter (Signed)
Pt called returning your call 

## 2020-07-07 NOTE — Telephone Encounter (Signed)
LMTCB

## 2020-07-07 NOTE — Addendum Note (Signed)
Addended by: Burnard Hawthorne on: 07/07/2020 09:18 AM   Modules accepted: Orders

## 2020-07-10 ENCOUNTER — Ambulatory Visit: Payer: Medicare Other | Admitting: Family

## 2020-07-16 ENCOUNTER — Ambulatory Visit (INDEPENDENT_AMBULATORY_CARE_PROVIDER_SITE_OTHER): Payer: Medicare Other

## 2020-07-16 VITALS — Ht 59.02 in | Wt 160.0 lb

## 2020-07-16 DIAGNOSIS — Z0000 Encounter for general adult medical examination without abnormal findings: Secondary | ICD-10-CM | POA: Diagnosis not present

## 2020-07-16 NOTE — Progress Notes (Addendum)
Subjective:   Melanie Bird is a 79 y.o. female who presents for Medicare Annual (Subsequent) preventive examination.  Review of Systems    No ROS.  Medicare Wellness Virtual Visit.   Cardiac Risk Factors include: advanced age (>13men, >88 women);diabetes mellitus;hypertension     Objective:    Today's Vitals   07/16/20 1305  Weight: 160 lb (72.6 kg)  Height: 4' 11.02" (1.499 m)   Body mass index is 32.29 kg/m.  Advanced Directives 07/16/2020 09/25/2017 09/22/2017 11/16/2016 01/08/2016 11/10/2015  Does Patient Have a Medical Advance Directive? Yes Yes No Yes No;Yes Yes  Type of Paramedic of [place];Living will [place];Living will - [place];Living will Living will;Healthcare Power of Attorney Living will  Does patient want to make changes to medical advance directive? No - Patient declined - - - - No - Patient declined  Copy of [place] in Chart? No - copy requested No - copy requested - No - copy requested No - copy requested No - copy requested  Would patient like information on creating a medical advance directive? - - No - Patient declined - - -    Current Medications (verified) Outpatient Encounter Medications as of 07/16/2020  Medication Sig  . alendronate (FOSAMAX) 70 MG tablet   . amLODipine (NORVASC) 5 MG tablet TAKE 1 TABLET BY MOUTH EVERY DAY  . brimonidine (ALPHAGAN) 0.2 % ophthalmic solution   . diclofenac Sodium (VOLTAREN) 1 % GEL Apply 2-4 g topically 4 (four) times daily. 2 grams upper body and 4 grams lower body  . FeFum-FePo-FA-B Cmp-C-Zn-Mn-Cu (SE-TAN PLUS) 162-115.2-1 MG CAPS Take by mouth.  . latanoprost (XALATAN) 0.005 % ophthalmic solution Place 1 drop into both eyes at bedtime.  . meloxicam (MOBIC) 7.5 MG tablet TAKE 1 TABLET (7.5 MG TOTAL) BY MOUTH DAILY AS NEEDED FOR PAIN (SEVERE PAIN).  Marland Kitchen metFORMIN (GLUCOPHAGE-XR) 500 MG 24 hr tablet START 1 TABLET BY MOUTH DAILY  EVERY EVENING  . metoprolol tartrate (LOPRESSOR) 25 MG tablet TAKE 1 TABLET BY MOUTH TWICE A DAY  . omeprazole (PRILOSEC) 10 MG capsule Take 10 mg by mouth daily.  Marland Kitchen SIMBRINZA 1-0.2 % SUSP   . traZODone (DESYREL) 50 MG tablet TAKE 2 TABLETS (100 MG TOTAL) BY MOUTH AT BEDTIME.  . VENTOLIN HFA 108 (90 Base) MCG/ACT inhaler TAKE 2 PUFFS BY MOUTH EVERY 6 HOURS AS NEEDED FOR WHEEZE OR SHORTNESS OF BREATH   No facility-administered encounter medications on file as of 07/16/2020.    Allergies (verified) Patient has no known allergies.   History: Past Medical History:  Diagnosis Date  . Anemia   . GERD (gastroesophageal reflux disease)   . Hypertension    Past Surgical History:  Procedure Laterality Date  . PARATHYROIDECTOMY     09/2018   Family History  Problem Relation Age of Onset  . Diabetes Father   . Hypertension Sister   . Breast cancer Neg Hx    Social History   Socioeconomic History  . Marital status: Married    Spouse name: Not on file  . Number of children: Not on file  . Years of education: Not on file  . Highest education level: Not on file  Occupational History  . Not on file  Tobacco Use  . Smoking status: Never Smoker  . Smokeless tobacco: Never Used  Substance and Sexual Activity  . Alcohol use: No  . Drug use: No  . Sexual activity: Not Currently  Other Topics Concern  . Not on file  Social History Narrative   Melanie Bird Lives with her.    Social Determinants of Health   Financial Resource Strain: Low Risk   . Difficulty of Paying Living Expenses: Not hard at all  Food Insecurity: No Food Insecurity  . Worried About Charity fundraiser in the Last Year: Never true  . Ran Out of Food in the Last Year: Never true  Transportation Needs: No Transportation Needs  . Lack of Transportation (Medical): No  . Lack of Transportation (Non-Medical): No  Physical Activity:   . Days of Exercise per Week:   . Minutes of Exercise per Session:   Stress:  No Stress Concern Present  . Feeling of Stress : Not at all  Social Connections:   . Frequency of Communication with Friends and Family:   . Frequency of Social Gatherings with Friends and Family:   . Attends Religious Services:   . Active Member of Clubs or Organizations:   . Attends Archivist Meetings:   Marland Kitchen Marital Status:     Tobacco Counseling Counseling given: Not Answered   Clinical Intake:  Pre-visit preparation completed: Yes        Diabetes: Yes (Followed by pcp)  How often do you need to have someone help you when you read instructions, pamphlets, or other written materials from your doctor or pharmacy?: 1 - Never  Interpreter Needed?: No      Activities of Daily Living In your present state of health, do you have any difficulty performing the following activities: 07/16/2020  Hearing? N  Vision? N  Difficulty concentrating or making decisions? N  Walking or climbing stairs? N  Dressing or bathing? N  Doing errands, shopping? Y  Comment She does not Physiological scientist and eating ? N  Using the Toilet? N  In the past six months, have you accidently leaked urine? N  Do you have problems with loss of bowel control? N  Managing your Medications? N  Managing your Finances? N  Housekeeping or managing your Housekeeping? N  Some recent data might be hidden    Patient Care Team: Burnard Hawthorne, FNP as PCP - General (Family Medicine) Dallas Schimke, MD as Consulting Physician (Internal Medicine)  Indicate any recent Medical Services you may have received from other than [facility] providers in the past year (date may be approximate).     Assessment:   This is a routine wellness examination for Melanie Bird.  I connected with Melanie Bird today by telephone and verified that I am speaking with the correct person using two identifiers. Location patient: home Location provider: work Persons participating in the virtual visit: patient, Marine scientist.    I  discussed the limitations, risks, security and privacy concerns of performing an evaluation and management service by telephone and the availability of in person appointments. The patient expressed understanding and verbally consented to this telephonic visit.    Interactive audio and video telecommunications were attempted between this provider and patient, however failed, due to patient having technical difficulties OR patient did not have access to video capability.  We continued and completed visit with audio only.  Some vital signs may be absent or patient reported.   Hearing/Vision screen  Hearing Screening   125Hz  250Hz  500Hz  1000Hz  2000Hz  3000Hz  4000Hz  6000Hz  8000Hz   Right ear:           Left ear:           Comments:  Patient notes difficulty hearing with some conversational tones. She does not wear hearing aids. Audiology deferred per patient preference.   Vision Screening Comments: Wears corrective lenses Cataract extraction, bilateral Glaucoma; drops in use Visual acuity not assessed, virtual visit.  They have seen their ophthalmologist in the last 12 months.     Dietary issues and exercise activities discussed:  Healthy diet  Good water intake  Goals    . Follow up with Primary Care Provider     As needed      Depression Screen PHQ 2/9 Scores 07/16/2020 05/25/2020 01/13/2020 05/01/2019 11/16/2016  PHQ - 2 Score 0 0 0 1 0  PHQ- 9 Score - - 0 1 -    Fall Risk Fall Risk  07/16/2020 05/25/2020 01/13/2020 05/01/2019 07/20/2018  Falls in the past year? 0 0 0 0 Yes  Comment - - - - Emmi Telephone Survey: data to providers prior to load  Number falls in past yr: 0 0 0 0 1  Comment - - - - Emmi Telephone Survey Actual Response = 1  Injury with Fall? 0 0 0 0 No  Follow up Falls evaluation completed Falls evaluation completed Falls evaluation completed Falls evaluation completed -   Handrails in use when climbing stairs? Yes  Home free of loose throw rugs in walkways, pet beds,  electrical cords, etc? Yes  Adequate lighting in your home to reduce risk of falls? Yes   ASSISTIVE DEVICES UTILIZED TO PREVENT FALLS:  Life alert? No  Use of a cane, walker or w/c? Yes  Grab bars in the bathroom? No  Shower chair or bench in shower? No  Elevated toilet seat or a handicapped toilet? No   TIMED UP AND GO:  Was the test performed? No .   Cognitive Function: Patient is alert and oriented x3.  She loves to read daily.   MMSE - Mini Mental State Exam 11/16/2016  Orientation to time 5  Orientation to Place 5  Registration 3  Attention/ Calculation 0  Recall 3  Language- name 2 objects 0  Language- repeat 1  Language- follow 3 step command 3  Language- read & follow direction 0  Write a sentence 0  Copy design 0  Total score 20     6CIT Screen 07/16/2020  What Year? 0 points  What month? 0 points  What time? 0 points  Count back from 20 0 points  Months in reverse 4 points   Immunizations Immunization History  Administered Date(s) Administered  . Influenza, High Dose Seasonal PF 09/21/2016, 09/08/2017, 11/28/2018, 10/02/2019  . Influenza,inj,Quad PF,6+ Mos 10/26/2015  . Influenza-Unspecified 08/19/2014  . PFIZER SARS-COV-2 Vaccination 01/20/2020, 02/10/2020  . Pneumococcal Conjugate-13 02/08/2017  . Pneumococcal Polysaccharide-23 09/12/2006  . Td 12/19/2001  . Zoster 09/12/2006   Health Maintenance Health Maintenance  Topic Date Due  . FOOT EXAM  Never done  . URINE MICROALBUMIN  Never done  . HEMOGLOBIN A1C  08/30/2019  . TETANUS/TDAP  12/18/2021 (Originally 12/20/2011)  . INFLUENZA VACCINE  07/19/2020  . OPHTHALMOLOGY EXAM  01/02/2021  . DEXA SCAN  Completed  . COVID-19 Vaccine  Completed  . PNA vac Low Risk Adult  Completed   Dental Screening: Recommended annual dental exams for proper oral hygiene.  Community Resource Referral / Chronic Care Management: CRR required this visit?  No   CCM required this visit?  No      Plan:   Keep  all routine maintenance appointments.   Follow up 07/20/20 @ 11:30  I have personally reviewed and noted the following in the patient's chart:   . Medical and social history . Use of alcohol, tobacco or illicit drugs  . Current medications and supplements . Functional ability and status . Nutritional status . Physical activity . Advanced directives . List of other physicians . Hospitalizations, surgeries, and ER visits in previous 12 months . Vitals . Screenings to include cognitive, depression, and falls . Referrals and appointments  In addition, I have reviewed and discussed with patient certain preventive protocols, quality metrics, and best practice recommendations. A written personalized care plan for preventive services as well as general preventive health recommendations were provided to patient via mail.     Varney Biles, LPN   [id number]      Agree with plan. Mable Paris, NP

## 2020-07-16 NOTE — Patient Instructions (Addendum)
Melanie Bird , Thank you for taking time to come for your Medicare Wellness Visit. I appreciate your ongoing commitment to your health goals. Please review the following plan we discussed and let me know if I can assist you in the future.   These are the goals we discussed: Goals    . Follow up with Primary Care Provider     As needed       This is a list of the screening recommended for you and due dates:  Health Maintenance  Topic Date Due  . Complete foot exam   Never done  . Urine Protein Check  Never done  . Hemoglobin A1C  08/30/2019  . Tetanus Vaccine  12/18/2021*  . Flu Shot  07/19/2020  . Eye exam for diabetics  01/02/2021  . DEXA scan (bone density measurement)  Completed  . COVID-19 Vaccine  Completed  . Pneumonia vaccines  Completed  *Topic was postponed. The date shown is not the original due date.    Immunizations Immunization History  Administered Date(s) Administered  . Influenza, High Dose Seasonal PF 09/21/2016, 09/08/2017, 11/28/2018  . Influenza,inj,Quad PF,6+ Mos 10/26/2015  . Influenza-Unspecified 08/19/2014  . Pneumococcal Conjugate-13 02/08/2017  . Pneumococcal Polysaccharide-23 09/12/2006  . Td 12/19/2001  . Zoster 09/12/2006   Keep all routine maintenance appointments.   Follow up 07/20/20 @ 11:30  Advanced directives: End of life planning; Advance aging; Advanced directives discussed.  Copy of current HCPOA/Living Will requested.    Conditions/risks identified: none new  Follow up in one year for your annual wellness visit.   Preventive Care 54 Years and Older, Female Preventive care refers to lifestyle choices and visits with your health care provider that can promote health and wellness. What does preventive care include?  A yearly physical exam. This is also called an annual well check.  Dental exams once or twice a year.  Routine eye exams. Ask your health care provider how often you should have your eyes checked.  Personal lifestyle  choices, including:  Daily care of your teeth and gums.  Regular physical activity.  Eating a healthy diet.  Avoiding tobacco and drug use.  Limiting alcohol use.  Practicing safe sex.  Taking low-dose aspirin every day.  Taking vitamin and mineral supplements as recommended by your health care provider. What happens during an annual well check? The services and screenings done by your health care provider during your annual well check will depend on your age, overall health, lifestyle risk factors, and family history of disease. Counseling  Your health care provider may ask you questions about your:  Alcohol use.  Tobacco use.  Drug use.  Emotional well-being.  Home and relationship well-being.  Sexual activity.  Eating habits.  History of falls.  Memory and ability to understand (cognition).  Work and work Statistician.  Reproductive health. Screening  You may have the following tests or measurements:  Height, weight, and BMI.  Blood pressure.  Lipid and cholesterol levels. These may be checked every 5 years, or more frequently if you are over 56 years old.  Skin check.  Lung cancer screening. You may have this screening every year starting at age 60 if you have a 30-pack-year history of smoking and currently smoke or have quit within the past 15 years.  Fecal occult blood test (FOBT) of the stool. You may have this test every year starting at age 94.  Flexible sigmoidoscopy or colonoscopy. You may have a sigmoidoscopy every 5 years or a  colonoscopy every 10 years starting at age 51.  Hepatitis C blood test.  Hepatitis B blood test.  Sexually transmitted disease (STD) testing.  Diabetes screening. This is done by checking your blood sugar (glucose) after you have not eaten for a while (fasting). You may have this done every 1-3 years.  Bone density scan. This is done to screen for osteoporosis. You may have this done starting at age  70.  Mammogram. This may be done every 1-2 years. Talk to your health care provider about how often you should have regular mammograms. Talk with your health care provider about your test results, treatment options, and if necessary, the need for more tests. Vaccines  Your health care provider may recommend certain vaccines, such as:  Influenza vaccine. This is recommended every year.  Tetanus, diphtheria, and acellular pertussis (Tdap, Td) vaccine. You may need a Td booster every 10 years.  Zoster vaccine. You may need this after age 56.  Pneumococcal 13-valent conjugate (PCV13) vaccine. One dose is recommended after age 83.  Pneumococcal polysaccharide (PPSV23) vaccine. One dose is recommended after age 46. Talk to your health care provider about which screenings and vaccines you need and how often you need them. This information is not intended to replace advice given to you by your health care provider. Make sure you discuss any questions you have with your health care provider. Document Released: 01/01/2016 Document Revised: 08/24/2016 Document Reviewed: 10/06/2015 Elsevier Interactive Patient Education  2017 [place] Prevention in the Home Falls can cause injuries. They can happen to people of all ages. There are many things you can do to make your home safe and to help prevent falls. What can I do on the outside of my home?  Regularly fix the edges of walkways and driveways and fix any cracks.  Remove anything that might make you trip as you walk through a door, such as a raised step or threshold.  Trim any bushes or trees on the path to your home.  Use bright outdoor lighting.  Clear any walking paths of anything that might make someone trip, such as rocks or tools.  Regularly check to see if handrails are loose or broken. Make sure that both sides of any steps have handrails.  Any raised decks and porches should have guardrails on the edges.  Have any  leaves, snow, or ice cleared regularly.  Use sand or salt on walking paths during winter.  Clean up any spills in your garage right away. This includes oil or grease spills. What can I do in the bathroom?  Use night lights.  Install grab bars by the toilet and in the tub and shower. Do not use towel bars as grab bars.  Use non-skid mats or decals in the tub or shower.  If you need to sit down in the shower, use a plastic, non-slip stool.  Keep the floor dry. Clean up any water that spills on the floor as soon as it happens.  Remove soap buildup in the tub or shower regularly.  Attach bath mats securely with double-sided non-slip rug tape.  Do not have throw rugs and other things on the floor that can make you trip. What can I do in the bedroom?  Use night lights.  Make sure that you have a light by your bed that is easy to reach.  Do not use any sheets or blankets that are too big for your bed. They should not hang down onto the  floor.  Have a firm chair that has side arms. You can use this for support while you get dressed.  Do not have throw rugs and other things on the floor that can make you trip. What can I do in the kitchen?  Clean up any spills right away.  Avoid walking on wet floors.  Keep items that you use a lot in easy-to-reach places.  If you need to reach something above you, use a strong step stool that has a grab bar.  Keep electrical cords out of the way.  Do not use floor polish or wax that makes floors slippery. If you must use wax, use non-skid floor wax.  Do not have throw rugs and other things on the floor that can make you trip. What can I do with my stairs?  Do not leave any items on the stairs.  Make sure that there are handrails on both sides of the stairs and use them. Fix handrails that are broken or loose. Make sure that handrails are as long as the stairways.  Check any carpeting to make sure that it is firmly attached to the stairs.  Fix any carpet that is loose or worn.  Avoid having throw rugs at the top or bottom of the stairs. If you do have throw rugs, attach them to the floor with carpet tape.  Make sure that you have a light switch at the top of the stairs and the bottom of the stairs. If you do not have them, ask someone to add them for you. What else can I do to help prevent falls?  Wear shoes that:  Do not have high heels.  Have rubber bottoms.  Are comfortable and fit you well.  Are closed at the toe. Do not wear sandals.  If you use a stepladder:  Make sure that it is fully opened. Do not climb a closed stepladder.  Make sure that both sides of the stepladder are locked into place.  Ask someone to hold it for you, if possible.  Clearly mark and make sure that you can see:  Any grab bars or handrails.  First and last steps.  Where the edge of each step is.  Use tools that help you move around (mobility aids) if they are needed. These include:  Canes.  Walkers.  Scooters.  Crutches.  Turn on the lights when you go into a dark area. Replace any light bulbs as soon as they burn out.  Set up your furniture so you have a clear path. Avoid moving your furniture around.  If any of your floors are uneven, fix them.  If there are any pets around you, be aware of where they are.  Review your medicines with your doctor. Some medicines can make you feel dizzy. This can increase your chance of falling. Ask your doctor what other things that you can do to help prevent falls. This information is not intended to replace advice given to you by your health care provider. Make sure you discuss any questions you have with your health care provider. Document Released: 10/01/2009 Document Revised: 05/12/2016 Document Reviewed: 01/09/2015 Elsevier Interactive Patient Education  2017 [employer].

## 2020-07-20 ENCOUNTER — Telehealth: Payer: Self-pay

## 2020-07-20 ENCOUNTER — Ambulatory Visit (INDEPENDENT_AMBULATORY_CARE_PROVIDER_SITE_OTHER): Payer: Medicare Other | Admitting: Family

## 2020-07-20 ENCOUNTER — Other Ambulatory Visit: Payer: Self-pay

## 2020-07-20 ENCOUNTER — Other Ambulatory Visit (INDEPENDENT_AMBULATORY_CARE_PROVIDER_SITE_OTHER): Payer: Medicare Other

## 2020-07-20 ENCOUNTER — Encounter: Payer: Self-pay | Admitting: Family

## 2020-07-20 VITALS — BP 136/72 | Temp 98.1°F | Ht 59.02 in | Wt 177.0 lb

## 2020-07-20 DIAGNOSIS — R440 Auditory hallucinations: Secondary | ICD-10-CM | POA: Diagnosis not present

## 2020-07-20 DIAGNOSIS — D619 Aplastic anemia, unspecified: Secondary | ICD-10-CM

## 2020-07-20 DIAGNOSIS — I10 Essential (primary) hypertension: Secondary | ICD-10-CM

## 2020-07-20 DIAGNOSIS — M810 Age-related osteoporosis without current pathological fracture: Secondary | ICD-10-CM | POA: Diagnosis not present

## 2020-07-20 LAB — COMPREHENSIVE METABOLIC PANEL
ALT: 7 U/L (ref 0–35)
AST: 11 U/L (ref 0–37)
Albumin: 3.8 g/dL (ref 3.5–5.2)
Alkaline Phosphatase: 75 U/L (ref 39–117)
BUN: 16 mg/dL (ref 6–23)
CO2: 21 mEq/L (ref 19–32)
Calcium: 8.6 mg/dL (ref 8.4–10.5)
Chloride: 105 mEq/L (ref 96–112)
Creatinine, Ser: 0.94 mg/dL (ref 0.40–1.20)
GFR: 57.08 mL/min — ABNORMAL LOW (ref >60.00)
Glucose, Bld: 126 mg/dL — ABNORMAL HIGH (ref 70–99)
Potassium: 4.6 mEq/L (ref 3.5–5.1)
Sodium: 139 mEq/L (ref 135–145)
Total Bilirubin: 0.3 mg/dL (ref 0.2–1.2)
Total Protein: 6.5 g/dL (ref 6.0–8.3)

## 2020-07-20 LAB — CBC WITH DIFFERENTIAL/PLATELET
Basophils Absolute: 0.1 10*3/uL (ref 0.0–0.1)
Basophils Relative: 0.9 % (ref 0.0–3.0)
Eosinophils Absolute: 0 10*3/uL (ref 0.0–0.7)
Eosinophils Relative: 0.3 % (ref 0.0–5.0)
HCT: 24.4 % — ABNORMAL LOW (ref 36.0–46.0)
Hemoglobin: 7.4 g/dL — CL (ref 12.0–15.0)
Lymphocytes Relative: 8.3 % — ABNORMAL LOW (ref 12.0–46.0)
Lymphs Abs: 1 10*3/uL (ref 0.7–4.0)
MCHC: 30.3 g/dL (ref 30.0–36.0)
MCV: 78.4 fl (ref 78.0–100.0)
Monocytes Absolute: 0.8 10*3/uL (ref 0.1–1.0)
Monocytes Relative: 6.9 % (ref 3.0–12.0)
Neutro Abs: 10.3 10*3/uL — ABNORMAL HIGH (ref 1.4–7.7)
Neutrophils Relative %: 83.6 % — ABNORMAL HIGH (ref 43.0–77.0)
Platelets: 515 10*3/uL — ABNORMAL HIGH (ref 150.0–400.0)
RBC: 3.12 Mil/uL — ABNORMAL LOW (ref 3.87–5.11)
RDW: 23.7 % — ABNORMAL HIGH (ref 11.5–15.5)
WBC: 12.3 10*3/uL — ABNORMAL HIGH (ref 4.0–10.5)

## 2020-07-20 LAB — HEPATIC FUNCTION PANEL
ALT: 8 U/L (ref 0–35)
AST: 11 U/L (ref 0–37)
Albumin: 3.9 g/dL (ref 3.5–5.2)
Alkaline Phosphatase: 75 U/L (ref 39–117)
Bilirubin, Direct: 0.1 mg/dL (ref 0.0–0.3)
Total Bilirubin: 0.3 mg/dL (ref 0.2–1.2)
Total Protein: 6.6 g/dL (ref 6.0–8.3)

## 2020-07-20 LAB — TSH: TSH: 4.66 u[IU]/mL — ABNORMAL HIGH (ref 0.35–4.50)

## 2020-07-20 LAB — LIPID PANEL
Cholesterol: 185 mg/dL (ref 0–200)
HDL: 73.4 mg/dL (ref >39.00)
LDL Cholesterol: 88 mg/dL (ref 0–99)
NonHDL: 111.26
Total CHOL/HDL Ratio: 3
Triglycerides: 117 mg/dL (ref 0.0–149.0)
VLDL: 23.4 mg/dL (ref 0.0–40.0)

## 2020-07-20 LAB — VITAMIN D 25 HYDROXY (VIT D DEFICIENCY, FRACTURES): VITD: 30.09 ng/mL (ref 30.00–100.00)

## 2020-07-20 LAB — HEMOGLOBIN A1C: Hgb A1c MFr Bld: 7.2 % — ABNORMAL HIGH (ref 4.6–6.5)

## 2020-07-20 NOTE — Assessment & Plan Note (Signed)
Stable, continue regimen. 

## 2020-07-20 NOTE — Assessment & Plan Note (Signed)
Chronic, unchanged.  Reassured by conversation with patient today, she was alert and oriented, articulate.  Well dressed and appropriately groomed.  During our conversation, she had a very hard time understanding me as both had masks on.  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  Benign HEENT, neurologic exam today.  Pending thorough lab evaluation, MRI brain and also consult the ear nose and throat.  I also referred patient for psychiatric evaluation as in the past she has showed more irritability.  She was not that way during her visit today.  On occasion per daughter, she can be agitated or irritable at home.  Also suspect this could be related to frustration of not being able to hear very well. Close follow up.

## 2020-07-20 NOTE — Patient Instructions (Addendum)
Referral to ear nose and throat, MRI of the brain. Let us know if you dont hear back within a week in regards to an appointment being scheduled.   Please call  and schedule your 3D mammogram, bone density scan as discussed.   [place]  E[place], [place]

## 2020-07-20 NOTE — Telephone Encounter (Signed)
CRITICAL VALUE STICKER  CRITICAL VALUE: Hemoglobin 7.4  RECEIVER (on-site recipient of call): [place] NOTIFIED: 07/20/20;3:50  MESSENGER (representative from lab):Kenney Houseman  MD NOTIFIED: Vidal Schwalbe  TIME OF NOTIFICATION: 3:55 pm   RESPONSE:

## 2020-07-20 NOTE — Progress Notes (Signed)
Subjective:    Patient ID: Melanie Bird, female    DOB: Jan 22, 1941, 79 y.o.   MRN: [id number]  CC: Melanie Bird is a 79 y.o. female who presents today for follow up.   HPI: Continue to hear voices at home for years, this symptoms comes and goes.  Daughter accompanies her today.  Patient describes sounds like a 'tape player' hearing daughter's voice saying 'no, no, no.'  She has not been able to identify any exacerbating features for this.  Daughter states she can 'get to hollaring' if upset with her.  This comes and goes. she states her mother is not forgetful.  She doesn't report unsafe behavior.   Lives with daughter, one dog.   Has very strong faith.   No depression. Sleeping well.   No tinnitus, HA, vision changes.  No falls. Using cane.  Chronic hearing loss.   Regular bowel movements. No dysuria.   Sleeping well.  Stays inside the house while daughter is at work.  Manages finances. Daughter does grocery shopping.   HTN- compliant with amlodipine, metoprolol.   Osteoporosis - compliant with fosamax.  No trouble swallowing   HISTORY:  Past Medical History:  Diagnosis Date  . Anemia   . GERD (gastroesophageal reflux disease)   . Hypertension    Past Surgical History:  Procedure Laterality Date  . PARATHYROIDECTOMY     09/2018   Family History  Problem Relation Age of Onset  . Diabetes Father   . Hypertension Sister   . Breast cancer Neg Hx     Allergies: Patient has no known allergies. Current Outpatient Medications on File Prior to Visit  Medication Sig Dispense Refill  . alendronate (FOSAMAX) 70 MG tablet     . amLODipine (NORVASC) 5 MG tablet TAKE 1 TABLET BY MOUTH EVERY DAY 90 tablet 1  . brimonidine (ALPHAGAN) 0.2 % ophthalmic solution     . diclofenac Sodium (VOLTAREN) 1 % GEL Apply 2-4 g topically 4 (four) times daily. 2 grams upper body and 4 grams lower body 100 g 2  . FeFum-FePo-FA-B Cmp-C-Zn-Mn-Cu (SE-TAN PLUS) 162-115.2-1 MG CAPS Take by  mouth.    . latanoprost (XALATAN) 0.005 % ophthalmic solution Place 1 drop into both eyes at bedtime.    . meloxicam (MOBIC) 7.5 MG tablet TAKE 1 TABLET (7.5 MG TOTAL) BY MOUTH DAILY AS NEEDED FOR PAIN (SEVERE PAIN). 30 tablet 2  . metFORMIN (GLUCOPHAGE-XR) 500 MG 24 hr tablet START 1 TABLET BY MOUTH DAILY EVERY EVENING 90 tablet 3  . metoprolol tartrate (LOPRESSOR) 25 MG tablet TAKE 1 TABLET BY MOUTH TWICE A DAY 180 tablet 1  . omeprazole (PRILOSEC) 10 MG capsule Take 10 mg by mouth daily.    Marland Kitchen SIMBRINZA 1-0.2 % SUSP     . traZODone (DESYREL) 50 MG tablet TAKE 2 TABLETS (100 MG TOTAL) BY MOUTH AT BEDTIME. 180 tablet 1  . VENTOLIN HFA 108 (90 Base) MCG/ACT inhaler TAKE 2 PUFFS BY MOUTH EVERY 6 HOURS AS NEEDED FOR WHEEZE OR SHORTNESS OF BREATH 18 g 2   No current facility-administered medications on file prior to visit.    Social History   Tobacco Use  . Smoking status: Never Smoker  . Smokeless tobacco: Never Used  Substance Use Topics  . Alcohol use: No  . Drug use: No    Review of Systems  Constitutional: Negative for chills and fever.  HENT: Positive for hearing loss.   Eyes: Negative for visual disturbance.  Respiratory: Negative for  cough.   Cardiovascular: Negative for chest pain and palpitations.  Gastrointestinal: Negative for nausea and vomiting.  Neurological: Negative for dizziness and headaches.  Psychiatric/Behavioral: Positive for agitation and hallucinations. Negative for confusion and sleep disturbance.      Objective:    BP 136/72 (BP Location: Left Arm, Patient Position: Sitting)   Temp 98.1 F (36.7 C)   Ht 4' 11.02" (1.499 m)   Wt 177 lb (80.3 kg)   BMI 35.73 kg/m  BP Readings from Last 3 Encounters:  07/20/20 136/72  07/08/19 122/88  02/27/19 (!) 142/88   Wt Readings from Last 3 Encounters:  07/20/20 177 lb (80.3 kg)  07/16/20 160 lb (72.6 kg)  05/25/20 160 lb (72.6 kg)    Physical Exam Vitals reviewed.  Constitutional:      Appearance:  She is well-developed.  HENT:     Head: Normocephalic and atraumatic.     Right Ear: Hearing, tympanic membrane, ear canal and external ear normal. No swelling or tenderness. No middle ear effusion. Tympanic membrane is not erythematous or bulging.     Left Ear: Hearing, tympanic membrane, ear canal and external ear normal. No swelling or tenderness.  No middle ear effusion. Tympanic membrane is not erythematous or bulging.     Nose: No rhinorrhea.     Right Sinus: No maxillary sinus tenderness or frontal sinus tenderness.     Left Sinus: No maxillary sinus tenderness or frontal sinus tenderness.     Mouth/Throat:     Pharynx: Uvula midline. No posterior oropharyngeal erythema or uvula swelling.  Eyes:     General: Lids are normal. Lids are everted, no foreign bodies appreciated.     Conjunctiva/sclera: Conjunctivae normal.     Pupils: Pupils are equal, round, and reactive to light.     Comments: Fundus normal bilaterally.   Cardiovascular:     Rate and Rhythm: Normal rate and regular rhythm.     Pulses: Normal pulses.     Heart sounds: Normal heart sounds.  Pulmonary:     Effort: Pulmonary effort is normal.     Breath sounds: Normal breath sounds. No wheezing, rhonchi or rales.  Lymphadenopathy:     Head:     Right side of head: No submental, submandibular, tonsillar, preauricular, posterior auricular or occipital adenopathy.     Left side of head: No submental, submandibular, tonsillar, preauricular, posterior auricular or occipital adenopathy.     Cervical: No cervical adenopathy.     Right cervical: No superficial, deep or posterior cervical adenopathy.    Left cervical: No superficial, deep or posterior cervical adenopathy.  Skin:    General: Skin is warm and dry.  Neurological:     Mental Status: She is alert.     Cranial Nerves: No cranial nerve deficit.     Sensory: No sensory deficit.     Deep Tendon Reflexes:     Reflex Scores:      Bicep reflexes are 2+ on the right  side and 2+ on the left side.      Patellar reflexes are 2+ on the right side and 2+ on the left side.    Comments: Grip equal and strong bilateral upper extremities. Gait strong and steady. Able to perform rapid alternating movement without difficulty.   Psychiatric:        Speech: Speech normal.        Behavior: Behavior normal.        Thought Content: Thought content normal.  Assessment & Plan:   Problem List Items Addressed This Visit      Cardiovascular and Mediastinum   HTN (hypertension)    Stable, continue regimen      Relevant Orders   TSH (Completed)   CBC with Differential/Platelet (Completed)   Comprehensive metabolic panel (Completed)   Hemoglobin A1c (Completed)   Lipid panel (Completed)   VITAMIN D 25 Hydroxy (Vit-D Deficiency, Fractures) (Completed)     Musculoskeletal and Integument   Osteoporosis    Tolerating medication well, will continue.  Pending bone density      Relevant Orders   MM 3D SCREEN BREAST BILATERAL   DG Bone Density     Other   Auditory hallucination    Chronic, unchanged.  Reassured by conversation with patient today, she was alert and oriented, articulate.  Well dressed and appropriately groomed.  During our conversation, she had a very hard time understanding me as both had masks on.  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  Benign HEENT, neurologic exam today.  Pending thorough lab evaluation, MRI brain and also consult the ear nose and throat.  I also referred patient for psychiatric evaluation as in the past she has showed more irritability.  She was not that way during her visit today.  On occasion per daughter, she can be agitated or irritable at home.  Also suspect this could be related to frustration of not being able to hear very well. Close follow up.       Relevant Orders   Ambulatory referral to ENT   MR Brain W Wo Contrast      Other Visit Diagnoses    Aplastic anemia ([place])    -  Primary   Relevant Orders   B12 and Folate Panel   IBC + Ferritin   Lactate Dehydrogenase (LDH)   Haptoglobin   Hepatic function panel   Reticulocytes   anemia       Relevant Orders   B12 and Folate Panel   IBC + Ferritin   Lactate Dehydrogenase (LDH)   Haptoglobin   Hepatic function panel   Reticulocytes     Of note, patient will schedule bone density, mammogram.  I am having Paralee Cancel. Andy maintain her omeprazole, Se-Tan PLUS, latanoprost, metFORMIN, diclofenac Sodium, Ventolin HFA, metoprolol tartrate, amLODipine, traZODone, Simbrinza, brimonidine, alendronate, and meloxicam.   No orders of the defined types were placed in this encounter.   Return precautions given.   Risks, benefits, and alternatives of the medications and treatment plan prescribed today were discussed, and patient expressed understanding.   Education regarding symptom management and diagnosis given to patient on AVS.  Continue to follow with Burnard Hawthorne, FNP for routine health maintenance.   Marjory Lies and I agreed with plan.   Mable Paris, FNP

## 2020-07-20 NOTE — Assessment & Plan Note (Signed)
Tolerating medication well, will continue.  Pending bone density

## 2020-07-21 ENCOUNTER — Other Ambulatory Visit: Payer: Self-pay | Admitting: Internal Medicine

## 2020-07-21 ENCOUNTER — Telehealth: Payer: Self-pay | Admitting: Internal Medicine

## 2020-07-21 ENCOUNTER — Telehealth: Payer: Self-pay | Admitting: Family

## 2020-07-21 ENCOUNTER — Other Ambulatory Visit: Payer: Self-pay | Admitting: Family

## 2020-07-21 DIAGNOSIS — D509 Iron deficiency anemia, unspecified: Secondary | ICD-10-CM

## 2020-07-21 DIAGNOSIS — D500 Iron deficiency anemia secondary to blood loss (chronic): Secondary | ICD-10-CM

## 2020-07-21 DIAGNOSIS — D619 Aplastic anemia, unspecified: Secondary | ICD-10-CM

## 2020-07-21 LAB — IBC + FERRITIN
Ferritin: 3.3 ng/mL — ABNORMAL LOW (ref 10.0–291.0)
Iron: 69 ug/dL (ref 42–145)
Saturation Ratios: 14.1 % — ABNORMAL LOW (ref 20.0–50.0)
Transferrin: 350 mg/dL (ref 212.0–360.0)

## 2020-07-21 LAB — RETICULOCYTES
ABS Retic: 64050 cells/uL (ref 20000–8000)
Retic Ct Pct: 2.1 %

## 2020-07-21 LAB — HAPTOGLOBIN: Haptoglobin: 284 mg/dL — ABNORMAL HIGH (ref 43–212)

## 2020-07-21 LAB — B12 AND FOLATE PANEL
Folate: >24.8 ng/mL (ref >5.9)
Vitamin B-12: 371 pg/mL (ref 211–911)

## 2020-07-21 LAB — LACTATE DEHYDROGENASE: LDH: 194 U/L (ref 120–250)

## 2020-07-21 NOTE — Telephone Encounter (Signed)
Pt needs appt ASAP  # MD- labs- cbc/HOLD tube; IV Venofer- GB

## 2020-07-21 NOTE — Telephone Encounter (Signed)
Dr. Crissie Reese attempted to arrange for an apt. Pt stated that she was "too busy to come to any appointments and had multiple things going on at this time. I will call you back if I want to come and when I'm ready to schedule."

## 2020-07-21 NOTE — Telephone Encounter (Signed)
Call pt She is severely anemic and will need iron infusions. I have consulted with hematology who advised follow up with him, Dr Burlene Arnt, asap.  Please give pt his number and ensure she calls today if she has not heard from him [phone]. Please also ask if she is HAVING abdominal pain, blood in stool, blood in urine.   I would like her to pick up stool card from office asap; I have ordered , please leave at front desk She also needs urine checked for blood; I have ordered. Please sch asap urine lab.

## 2020-07-21 NOTE — Telephone Encounter (Signed)
Attempted to call the patient and give provider message. Patient could not hear me during the call and attempted to call the patient again. The call went straight to voicemail and could not reach patient again.

## 2020-07-21 NOTE — Addendum Note (Signed)
Addended by: Gloris Ham on: 07/21/2020 01:58 PM   Modules accepted: Orders

## 2020-07-21 NOTE — Telephone Encounter (Signed)
Is patients daughter Lanelle Bal on [place]?  Can we can call her if so?  There are also TWO result notes with labs. When you call ensure you go over ALL  Thank you

## 2020-07-22 ENCOUNTER — Other Ambulatory Visit: Payer: Self-pay

## 2020-07-22 ENCOUNTER — Other Ambulatory Visit: Payer: Self-pay | Admitting: Internal Medicine

## 2020-07-22 NOTE — Telephone Encounter (Signed)
Informed patient's PCP, Ms. Mable Paris, NP of patient's reluctance with cancer center follow-up/need for iron infusion.  Kindly agrees to follow with patient. GB

## 2020-07-22 NOTE — Telephone Encounter (Signed)
Called Daughter, Larah Kuntzman, to discuss all lab results. Lanelle Bal states that she is on a weekly Iron Supplement. Lanelle Bal verbalized understanding over the correct way to take the Thyroid medication and will give the message to [place]. Jerriah has an infusion appointment scheduled at the cancer center on 07/24/20. Lanelle Bal verbalized understanding and had no further question.

## 2020-07-22 NOTE — Telephone Encounter (Signed)
Patient personally called Nira Conn RN back. She wanted more information on why the appointments were needed. She said she was "hard of hearing and could not understand the purpose of the appointment." I explained the need for the apt in detail. She understood that I already spoke to her daughter this morning and appts were provided.  She thanked me for the detailed explanation and accepted the apt.

## 2020-07-22 NOTE — Telephone Encounter (Signed)
Daughter called cancer center to discuss her mother's apt request. Rn Spoke with daughter, who will bring pt to an apt on Friday for lab/md/iv iron.  Dr. Jacinto Reap- any additional lab studies?

## 2020-07-22 NOTE — Addendum Note (Signed)
Addended by: Delice Bison E on: 07/22/2020 11:50 AM   Modules accepted: Orders

## 2020-07-22 NOTE — Progress Notes (Signed)
IV venofer.

## 2020-07-22 NOTE — Telephone Encounter (Signed)
To clarify,  Pt taking synthroid correctly?   If she isnt then I wont change dose, if she has been taking correctly, I will increase synthroid

## 2020-07-22 NOTE — Telephone Encounter (Signed)
Cbc/hold tube- Thanks GB

## 2020-07-22 NOTE — Telephone Encounter (Signed)
Orders added

## 2020-07-23 ENCOUNTER — Other Ambulatory Visit (INDEPENDENT_AMBULATORY_CARE_PROVIDER_SITE_OTHER): Payer: Medicare Other

## 2020-07-23 ENCOUNTER — Other Ambulatory Visit: Payer: Self-pay

## 2020-07-23 ENCOUNTER — Telehealth: Payer: Self-pay | Admitting: Family

## 2020-07-23 DIAGNOSIS — D500 Iron deficiency anemia secondary to blood loss (chronic): Secondary | ICD-10-CM

## 2020-07-23 DIAGNOSIS — D619 Aplastic anemia, unspecified: Secondary | ICD-10-CM

## 2020-07-23 LAB — URINALYSIS, ROUTINE W REFLEX MICROSCOPIC
Bilirubin Urine: NEGATIVE
Nitrite: POSITIVE — AB
Specific Gravity, Urine: 1.025 (ref 1.000–1.030)
Total Protein, Urine: 30 — AB
Urine Glucose: NEGATIVE
Urobilinogen, UA: 0.2 (ref 0.0–1.0)
pH: 5.5 (ref 5.0–8.0)

## 2020-07-23 NOTE — Telephone Encounter (Signed)
Patient came into the office for a lab draw. Asked if she is taking the Synthroid medication correctly. Patients daughter was with her and explained that they were unsure if shes taking the synthroid correctly. They stated that they will begin taking the synthroid like directed in the last lab results.

## 2020-07-23 NOTE — Telephone Encounter (Signed)
lft vm for pt to call ofc to sch MRI. 

## 2020-07-23 NOTE — Telephone Encounter (Signed)
Left message to call back  

## 2020-07-24 ENCOUNTER — Inpatient Hospital Stay: Payer: Medicare Other | Attending: Internal Medicine

## 2020-07-24 ENCOUNTER — Other Ambulatory Visit: Payer: Self-pay

## 2020-07-24 ENCOUNTER — Inpatient Hospital Stay: Payer: Medicare Other

## 2020-07-24 ENCOUNTER — Inpatient Hospital Stay (HOSPITAL_BASED_OUTPATIENT_CLINIC_OR_DEPARTMENT_OTHER): Payer: Medicare Other | Admitting: Internal Medicine

## 2020-07-24 ENCOUNTER — Encounter: Payer: Self-pay | Admitting: Internal Medicine

## 2020-07-24 VITALS — BP 142/64 | HR 88 | Temp 97.6°F | Resp 20

## 2020-07-24 DIAGNOSIS — D500 Iron deficiency anemia secondary to blood loss (chronic): Secondary | ICD-10-CM | POA: Insufficient documentation

## 2020-07-24 DIAGNOSIS — D509 Iron deficiency anemia, unspecified: Secondary | ICD-10-CM

## 2020-07-24 LAB — CBC WITH DIFFERENTIAL/PLATELET
Abs Immature Granulocytes: 0.05 10*3/uL (ref 0.00–0.07)
Basophils Absolute: 0.1 10*3/uL (ref 0.0–0.1)
Basophils Relative: 1 %
Eosinophils Absolute: 0.1 10*3/uL (ref 0.0–0.5)
Eosinophils Relative: 1 %
HCT: 26.4 % — ABNORMAL LOW (ref 36.0–46.0)
Hemoglobin: 7.6 g/dL — ABNORMAL LOW (ref 12.0–15.0)
Immature Granulocytes: 1 %
Lymphocytes Relative: 9 %
Lymphs Abs: 0.7 10*3/uL (ref 0.7–4.0)
MCH: 23.3 pg — ABNORMAL LOW (ref 26.0–34.0)
MCHC: 28.8 g/dL — ABNORMAL LOW (ref 30.0–36.0)
MCV: 81 fL (ref 80.0–100.0)
Monocytes Absolute: 0.6 10*3/uL (ref 0.1–1.0)
Monocytes Relative: 7 %
Neutro Abs: 6.4 10*3/uL (ref 1.7–7.7)
Neutrophils Relative %: 81 %
Platelets: 564 10*3/uL — ABNORMAL HIGH (ref 150–400)
RBC: 3.26 MIL/uL — ABNORMAL LOW (ref 3.87–5.11)
RDW: 21 % — ABNORMAL HIGH (ref 11.5–15.5)
WBC: 8 10*3/uL (ref 4.0–10.5)
nRBC: 0.3 % — ABNORMAL HIGH (ref 0.0–0.2)

## 2020-07-24 LAB — IFE AND PE, RANDOM URINE
% BETA, Urine: 17.9 %
ALBUMIN, U: 52.8 %
ALPHA 1 URINE: 2.5 %
ALPHA-2-GLOBULIN, U: 12.9 %
GAMMA GLOBULIN URINE: 13.9 %
Protein, Ur: 116.3 mg/dL

## 2020-07-24 LAB — SAMPLE TO BLOOD BANK

## 2020-07-24 MED ORDER — SODIUM CHLORIDE 0.9 % IV SOLN: 200.0000 mg | Freq: Once | INTRAVENOUS | Status: DC

## 2020-07-24 MED ORDER — IRON SUCROSE 20 MG/ML IV SOLN
200.0000 mg | Freq: Once | INTRAVENOUS | Status: AC
  Administered 2020-07-24: 200 mg via INTRAVENOUS
  Filled 2020-07-24: qty 10

## 2020-07-24 MED ORDER — SODIUM CHLORIDE 0.9 % IV SOLN
Freq: Once | INTRAVENOUS | Status: AC
  Filled 2020-07-24: qty 250

## 2020-07-24 NOTE — Assessment & Plan Note (Addendum)
#   Chronic-unclear etiology question. No obvious GI blood loss. Aug 2021-Hb 7.4; ferritin- 3; proceed with Venofer.   #Etiology is unclear; awaiting stool occult/PCP; negative.  Recommend further evaluation with GI for anemia.  #  DISPOSITION:  #  venofer today # venofer  weekly x3 more # Referral to [facility] re: anemia # Follow up in 6 weeks- MD; labs- cbc; venofer-Dr.B  Cc; Ms. Vidal Schwalbe

## 2020-07-24 NOTE — Progress Notes (Signed)
[place] OFFICE PROGRESS NOTE  Patient Care Team: Burnard Hawthorne, FNP as PCP - General (Family Medicine) Dallas Schimke, MD as Consulting Physician (Internal Medicine) Cammie Sickle, MD as Consulting Physician (Hematology and Oncology)  Cancer Staging No matching staging information was found for the patient.   Oncology History   No history exists.   # IDA- ? Etiology Chronic Iron PO BID; AUG 2021- Hb-7.4; Ferritin 4.  # 2008- BONE MARROW-- NORMOCELLULAR MARROW WITH TRILINEAGE HEMATOPOIESIS, NON-PARATRABECULAR LYMPHOID AGGREGATES.\;  - ADEQUATE STORAGE IRON. PERIPHERAL BLOOD: - NORMOCHROMIC ANEMIA WITH POLYCHROMASIA. - MILD THROMBOCYTOSIS.    INTERVAL HISTORY: Hard of hearing/accompanied by daughter.  Melanie Bird 79 y.o.  female pleasant patient above history of Anemia likely iron deficiency of unclear etiology- is here for follow-up.  In the interim patient noted to have worsening fatigue.  For which she was further evaluated by PCP noted to have severe anemia hemoglobin 7.6; also noted to have severe iron deficiency.  Patient denies any bloody stools or black or stools.   Review of Systems  Constitutional: Positive for malaise/fatigue. Negative for chills, diaphoresis, fever and weight loss.  HENT: Negative for nosebleeds and sore throat.   Eyes: Negative for double vision.  Respiratory: Negative for cough, hemoptysis, sputum production, shortness of breath and wheezing.   Cardiovascular: Negative for chest pain, palpitations, orthopnea and leg swelling.  Gastrointestinal: Negative for abdominal pain, blood in stool, constipation, diarrhea, heartburn, melena, nausea and vomiting.  Genitourinary: Negative for dysuria, frequency and urgency.  Musculoskeletal: Positive for back pain and joint pain.  Skin: Negative.  Negative for itching and rash.  Neurological: Negative for dizziness, tingling, focal weakness, weakness and headaches.   Endo/Heme/Allergies: Does not bruise/bleed easily.  Psychiatric/Behavioral: Negative for depression. The patient is not nervous/anxious and does not have insomnia.      PAST MEDICAL HISTORY :  Past Medical History:  Diagnosis Date  . Anemia   . GERD (gastroesophageal reflux disease)   . Hypertension     PAST SURGICAL HISTORY :   Past Surgical History:  Procedure Laterality Date  . PARATHYROIDECTOMY     09/2018    FAMILY HISTORY :   Family History  Problem Relation Age of Onset  . Diabetes Father   . Hypertension Sister   . Breast cancer Neg Hx     SOCIAL HISTORY:   Social History   Tobacco Use  . Smoking status: Never Smoker  . Smokeless tobacco: Never Used  Substance Use Topics  . Alcohol use: No  . Drug use: No    ALLERGIES:  has No Known Allergies.  MEDICATIONS:  Current Outpatient Medications  Medication Sig Dispense Refill  . alendronate (FOSAMAX) 70 MG tablet Take 70 mg by mouth once a week.     Marland Kitchen amLODipine (NORVASC) 5 MG tablet TAKE 1 TABLET BY MOUTH EVERY DAY 90 tablet 1  . brimonidine (ALPHAGAN) 0.2 % ophthalmic solution     . diclofenac Sodium (VOLTAREN) 1 % GEL Apply 2-4 g topically 4 (four) times daily. 2 grams upper body and 4 grams lower body 100 g 2  . latanoprost (XALATAN) 0.005 % ophthalmic solution Place 1 drop into both eyes at bedtime.    . meloxicam (MOBIC) 7.5 MG tablet TAKE 1 TABLET (7.5 MG TOTAL) BY MOUTH DAILY AS NEEDED FOR PAIN (SEVERE PAIN). 30 tablet 2  . metFORMIN (GLUCOPHAGE-XR) 500 MG 24 hr tablet START 1 TABLET BY MOUTH DAILY EVERY EVENING 90 tablet 3  .  metoprolol tartrate (LOPRESSOR) 25 MG tablet TAKE 1 TABLET BY MOUTH TWICE A DAY 180 tablet 1  . omeprazole (PRILOSEC) 10 MG capsule Take 10 mg by mouth daily.    Marland Kitchen SIMBRINZA 1-0.2 % SUSP     . traZODone (DESYREL) 50 MG tablet TAKE 2 TABLETS (100 MG TOTAL) BY MOUTH AT BEDTIME. 180 tablet 1  . VENTOLIN HFA 108 (90 Base) MCG/ACT inhaler TAKE 2 PUFFS BY MOUTH EVERY 6 HOURS AS  NEEDED FOR WHEEZE OR SHORTNESS OF BREATH 18 g 2   No current facility-administered medications for this visit.    PHYSICAL EXAMINATION:  BP 138/60 (BP Location: Left Arm, Patient Position: Sitting, Cuff Size: Large)   Pulse (!) 108   Temp 97.8 F (36.6 C) (Tympanic)   Resp 20   Ht 4' 11.7" (1.516 m)   Wt 178 lb (80.7 kg)   SpO2 99%   BMI 35.11 kg/m   Filed Weights   07/24/20 1350  Weight: 178 lb (80.7 kg)    Physical Exam Constitutional:      Comments: She is accompanied by daughter.  She is in a wheelchair.  HENT:     Head: Normocephalic and atraumatic.     Mouth/Throat:     Pharynx: No oropharyngeal exudate.  Eyes:     Pupils: Pupils are equal, round, and reactive to light.  Cardiovascular:     Rate and Rhythm: Normal rate and regular rhythm.  Pulmonary:     Effort: No respiratory distress.     Breath sounds: No wheezing.  Abdominal:     General: Bowel sounds are normal. There is no distension.     Palpations: Abdomen is soft. There is no mass.     Tenderness: There is no abdominal tenderness. There is no guarding or rebound.  Musculoskeletal:        General: No tenderness. Normal range of motion.     Cervical back: Normal range of motion and neck supple.  Skin:    General: Skin is warm.  Neurological:     Mental Status: She is alert and oriented to person, place, and time.  Psychiatric:        Mood and Affect: Affect normal.      LABORATORY DATA:  I have reviewed the data as listed    Component Value Date/Time   NA 139 07/20/2020 1227   K 4.6 07/20/2020 1227   CL 105 07/20/2020 1227   CO2 21 07/20/2020 1227   GLUCOSE 126 (H) 07/20/2020 1227   BUN 16 07/20/2020 1227   CREATININE 0.94 07/20/2020 1227   CALCIUM 8.6 07/20/2020 1227   PROT 6.6 07/23/2020 1013   ALBUMIN 3.9 07/20/2020 1656   AST 11 07/20/2020 1656   ALT 8 07/20/2020 1656   ALKPHOS 75 07/20/2020 1656   BILITOT 0.3 07/20/2020 1656   GFRNONAA >60 12/14/2017 1209   GFRAA >60  12/14/2017 1209    No results found for: SPEP, UPEP  Lab Results  Component Value Date   WBC 8.0 07/24/2020   NEUTROABS 6.4 07/24/2020   HGB 7.6 (L) 07/24/2020   HCT 26.4 (L) 07/24/2020   MCV 81.0 07/24/2020   PLT 564 (H) 07/24/2020      Chemistry      Component Value Date/Time   NA 139 07/20/2020 1227   K 4.6 07/20/2020 1227   CL 105 07/20/2020 1227   CO2 21 07/20/2020 1227   BUN 16 07/20/2020 1227   CREATININE 0.94 07/20/2020 1227  Component Value Date/Time   CALCIUM 8.6 07/20/2020 1227   ALKPHOS 75 07/20/2020 1656   AST 11 07/20/2020 1656   ALT 8 07/20/2020 1656   BILITOT 0.3 07/20/2020 1656       RADIOGRAPHIC STUDIES: I have personally reviewed the radiological images as listed and agreed with the findings in the report. No results found.   ASSESSMENT & PLAN:  Iron deficiency anemia due to chronic blood loss # Chronic-unclear etiology question. No obvious GI blood loss. Aug 2021-Hb 7.4; ferritin- 3; proceed with Venofer.   #Etiology is unclear; awaiting stool occult/PCP; negative.  Recommend further evaluation with GI for anemia.  #  DISPOSITION:  #  venofer today # venofer  weekly x3 more # Referral to [facility] re: anemia # Follow up in 6 weeks- MD; labs- cbc; venofer-Dr.B  Cc; Ms. Vidal Schwalbe     Orders Placed This Encounter  Procedures  . CBC with Differential    Standing Status:   Future    Standing Expiration Date:   07/24/2021  . Ambulatory referral to Gastroenterology    Referral Priority:   Routine    Referral Type:   Consultation    Referral Reason:   Specialty Services Required    Referred to Provider:   Jonathon Bellows, MD    Number of Visits Requested:   1   All questions were answered. The patient knows to call the clinic with any problems, questions or concerns.      Cammie Sickle, MD 07/26/2020 8:46 PM

## 2020-07-24 NOTE — Telephone Encounter (Signed)
Noted Will not change synthroid dose until follow up

## 2020-07-27 ENCOUNTER — Telehealth: Payer: Self-pay | Admitting: Family

## 2020-07-27 LAB — MULTIPLE MYELOMA PANEL, SERUM
Albumin SerPl Elph-Mcnc: 3.5 g/dL (ref 2.9–4.4)
Albumin/Glob SerPl: 1.2 (ref 0.7–1.7)
Alpha 1: 0.2 g/dL (ref 0.0–0.4)
Alpha2 Glob SerPl Elph-Mcnc: 1 g/dL (ref 0.4–1.0)
B-Globulin SerPl Elph-Mcnc: 1.2 g/dL (ref 0.7–1.3)
Gamma Glob SerPl Elph-Mcnc: 0.7 g/dL (ref 0.4–1.8)
Globulin, Total: 3.1 g/dL (ref 2.2–3.9)
IgA/Immunoglobulin A, Serum: 328 mg/dL (ref 64–422)
IgG (Immunoglobin G), Serum: 658 mg/dL (ref 586–1602)
IgM (Immunoglobulin M), Srm: 134 mg/dL (ref 26–217)

## 2020-07-27 LAB — PE AND FLC, SERUM
Ig Kappa Free Light Chain: 33.6 mg/L — ABNORMAL HIGH (ref 3.3–19.4)
Ig Lambda Free Light Chain: 26.7 mg/L — ABNORMAL HIGH (ref 5.7–26.3)
KAPPA/LAMBDA RATIO: 1.26 (ref 0.26–1.65)
Total Protein: 6.6 g/dL (ref 6.0–8.5)

## 2020-07-27 NOTE — Telephone Encounter (Signed)
I spoke with pt to sch MRI pt stated that she will need hold off with getting the MRI done at this time due to Anemia she is getting IV and feeling weak and will call back to sch.

## 2020-07-27 NOTE — Telephone Encounter (Signed)
noted 

## 2020-07-28 ENCOUNTER — Telehealth: Payer: Self-pay | Admitting: Family

## 2020-07-28 ENCOUNTER — Other Ambulatory Visit: Payer: Self-pay | Admitting: Family

## 2020-07-28 DIAGNOSIS — R8290 Unspecified abnormal findings in urine: Secondary | ICD-10-CM

## 2020-07-28 NOTE — Telephone Encounter (Signed)
Pt called in stated someone called and left message about repeat on urine test she said that she could not do it right now will call back for appointment because she is taking infusions

## 2020-07-31 ENCOUNTER — Telehealth: Payer: Self-pay | Admitting: *Deleted

## 2020-07-31 ENCOUNTER — Other Ambulatory Visit: Payer: Self-pay

## 2020-07-31 ENCOUNTER — Inpatient Hospital Stay: Payer: Medicare Other

## 2020-07-31 VITALS — BP 124/58 | HR 102 | Temp 96.1°F | Resp 18

## 2020-07-31 DIAGNOSIS — D500 Iron deficiency anemia secondary to blood loss (chronic): Secondary | ICD-10-CM

## 2020-07-31 MED ORDER — SODIUM CHLORIDE 0.9 % IV SOLN: 200.0000 mg | Freq: Once | INTRAVENOUS | Status: DC

## 2020-07-31 MED ORDER — SODIUM CHLORIDE 0.9 % IV SOLN
Freq: Once | INTRAVENOUS | Status: AC
  Filled 2020-07-31: qty 250

## 2020-07-31 MED ORDER — IRON SUCROSE 20 MG/ML IV SOLN
200.0000 mg | Freq: Once | INTRAVENOUS | Status: AC
  Administered 2020-07-31: 200 mg via INTRAVENOUS
  Filled 2020-07-31: qty 10

## 2020-07-31 NOTE — Telephone Encounter (Signed)
I spoke with daughter. She will discuss with her mother the importance of scheduling an apt with GI.  Daughter asked me to call her back on Monday morning to determine the patient's decision.

## 2020-07-31 NOTE — Telephone Encounter (Signed)
-----   Message ----- From: Cammie Sickle, MD Sent: 07/31/2020   3:15 PM EDT To: Lorrin Jackson, CMA, Gloris Ham, RN, * Subject: RE: referral                                   Nira Conn- Any thoughts? May be speak to daughter who might be able to convince the pt to make appt with Dr.Anna/GI. Thanks.  GB ----- Message ----- From: Shelby Mattocks, CMA Sent: 07/27/2020   2:00 PM EDT To: Cammie Sickle, MD Subject: referral                                       Patient states she does not want to scheduled a appointment because she is to tired and has no strength to make it to any appointments.

## 2020-07-31 NOTE — Progress Notes (Signed)
Pt tolerated infusion well, no s/s of distress or reaction noted. Pt and VS stable at discharge.  

## 2020-08-03 NOTE — Telephone Encounter (Signed)
I spoke with the patient's daughter. Pt is not willing to proceed with the GI referral until after her iron infusions. I explained to daughter that Dr. B would like the GI work up to determine the cause of pt's blood loss. Daughter states to have the GI office to go ahead and set up the referral in a few weeks. Daughter Lanelle Bal requested that the GI office call her personally to discuss the apt time options.   Referral msg sent to GI.

## 2020-08-07 ENCOUNTER — Inpatient Hospital Stay: Payer: Medicare Other

## 2020-08-07 ENCOUNTER — Other Ambulatory Visit: Payer: Self-pay

## 2020-08-07 VITALS — BP 128/64 | HR 95 | Temp 96.9°F | Resp 18

## 2020-08-07 DIAGNOSIS — D500 Iron deficiency anemia secondary to blood loss (chronic): Secondary | ICD-10-CM | POA: Diagnosis not present

## 2020-08-07 MED ORDER — SODIUM CHLORIDE 0.9 % IV SOLN: 200.0000 mg | Freq: Once | INTRAVENOUS | Status: DC

## 2020-08-07 MED ORDER — SODIUM CHLORIDE 0.9 % IV SOLN
Freq: Once | INTRAVENOUS | Status: AC
  Filled 2020-08-07: qty 250

## 2020-08-07 MED ORDER — IRON SUCROSE 20 MG/ML IV SOLN
200.0000 mg | Freq: Once | INTRAVENOUS | Status: AC
  Administered 2020-08-07: 200 mg via INTRAVENOUS
  Filled 2020-08-07: qty 10

## 2020-08-07 NOTE — Progress Notes (Signed)
Pt tolerated infusion well. No s/s of distress noted. Pt and VS stable at discharge.  

## 2020-08-14 ENCOUNTER — Other Ambulatory Visit: Payer: Self-pay

## 2020-08-14 ENCOUNTER — Inpatient Hospital Stay: Payer: Medicare Other

## 2020-08-14 VITALS — BP 136/49 | HR 86 | Temp 98.4°F | Resp 18

## 2020-08-14 DIAGNOSIS — D500 Iron deficiency anemia secondary to blood loss (chronic): Secondary | ICD-10-CM | POA: Diagnosis not present

## 2020-08-14 MED ORDER — SODIUM CHLORIDE 0.9 % IV SOLN: 200.0000 mg | Freq: Once | INTRAVENOUS | Status: DC

## 2020-08-14 MED ORDER — IRON SUCROSE 20 MG/ML IV SOLN
200.0000 mg | Freq: Once | INTRAVENOUS | Status: AC
  Administered 2020-08-14: 200 mg via INTRAVENOUS
  Filled 2020-08-14: qty 10

## 2020-08-14 MED ORDER — SODIUM CHLORIDE 0.9 % IV SOLN
Freq: Once | INTRAVENOUS | Status: AC
  Filled 2020-08-14: qty 250

## 2020-08-14 NOTE — Progress Notes (Signed)
Pt tolerated infusion well. No s/s of distress or reaction noted. Pt and VS stable at discharge.  

## 2020-08-27 ENCOUNTER — Telehealth: Payer: Self-pay | Admitting: Family

## 2020-08-27 NOTE — Telephone Encounter (Signed)
lft pt vm regarding follow up with her about MRI.

## 2020-09-01 ENCOUNTER — Ambulatory Visit (INDEPENDENT_AMBULATORY_CARE_PROVIDER_SITE_OTHER): Payer: Medicare Other | Admitting: Family

## 2020-09-01 ENCOUNTER — Other Ambulatory Visit: Payer: Self-pay

## 2020-09-01 ENCOUNTER — Encounter: Payer: Self-pay | Admitting: Family

## 2020-09-01 VITALS — BP 130/78 | HR 92 | Temp 98.2°F | Ht 59.0 in | Wt 176.4 lb

## 2020-09-01 DIAGNOSIS — R8290 Unspecified abnormal findings in urine: Secondary | ICD-10-CM

## 2020-09-01 DIAGNOSIS — M810 Age-related osteoporosis without current pathological fracture: Secondary | ICD-10-CM

## 2020-09-01 DIAGNOSIS — R440 Auditory hallucinations: Secondary | ICD-10-CM | POA: Diagnosis not present

## 2020-09-01 DIAGNOSIS — Z23 Encounter for immunization: Secondary | ICD-10-CM | POA: Diagnosis not present

## 2020-09-01 DIAGNOSIS — I10 Essential (primary) hypertension: Secondary | ICD-10-CM

## 2020-09-01 DIAGNOSIS — D500 Iron deficiency anemia secondary to blood loss (chronic): Secondary | ICD-10-CM

## 2020-09-01 LAB — IBC + FERRITIN
Ferritin: 52.2 ng/mL (ref 10.0–291.0)
Iron: 32 ug/dL — ABNORMAL LOW (ref 42–145)
Saturation Ratios: 7.1 % — ABNORMAL LOW (ref 20.0–50.0)
Transferrin: 323 mg/dL (ref 212.0–360.0)

## 2020-09-01 LAB — TSH: TSH: 3.41 u[IU]/mL (ref 0.35–4.50)

## 2020-09-01 LAB — MICROALBUMIN / CREATININE URINE RATIO
Creatinine,U: 249.7 mg/dL
Microalb Creat Ratio: 11.5 mg/g (ref 0.0–30.0)
Microalb, Ur: 28.6 mg/dL — ABNORMAL HIGH (ref 0.0–1.9)

## 2020-09-01 NOTE — Assessment & Plan Note (Signed)
Advised of importance of dexa as we are treating osteoporosis and mammogram. We had scheduled them for her today but she declines screening at this time.

## 2020-09-01 NOTE — Patient Instructions (Addendum)
Stool cards- please return.   Please consider MRI brain and ENT referral ; I would like you to do this.   Also, please allow me to schedule your mammogram and bone density. I want to ensure that we are up to date.   Stay safe

## 2020-09-01 NOTE — Progress Notes (Signed)
Subjective:    Patient ID: Melanie Bird, female    DOB: [birth year], 79 y.o.   MRN: [id number]  CC: Melanie Bird is a 79 y.o. female who presents today for follow up.   HPI: Anemia- continues to feel fatigue. This is unchanged.  No sob.   No hematuria, dysuria, blood in stool.   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 Not wearing hearing aids.   Declines MRI Brain and ENT consult at this time.   HTN- compliant with amlodipine 5mg , lopressor 25mg .  No cp.   Osteoporosis- compliant with fosamax. Due dexa. No trouble swallowing.   Not taking mobic     Due dexa/ mammogram  calcium 8.6 last month No ckd  following with Dr Rogue Bussing for Iron infusion, last 08/14/20; f/u appointment in 3 days.  Referred to [facility]; appt with Dr Vicente Males 10/22/2020     HISTORY:  Past Medical History:  Diagnosis Date  . Anemia   . GERD (gastroesophageal reflux disease)   . Hypertension    Past Surgical History:  Procedure Laterality Date  . PARATHYROIDECTOMY     09/2018   Family History  Problem Relation Age of Onset  . Diabetes Father   . Hypertension Sister   . Breast cancer Neg Hx     Allergies: Patient has no known allergies. Current Outpatient Medications on File Prior to Visit  Medication Sig Dispense Refill  . alendronate (FOSAMAX) 70 MG tablet Take 70 mg by mouth once a week.     Marland Kitchen amLODipine (NORVASC) 5 MG tablet TAKE 1 TABLET BY MOUTH EVERY DAY 90 tablet 1  . brimonidine (ALPHAGAN) 0.2 % ophthalmic solution     . diclofenac Sodium (VOLTAREN) 1 % GEL Apply 2-4 g topically 4 (four) times daily. 2 grams upper body and 4 grams lower body 100 g 2  . latanoprost (XALATAN) 0.005 % ophthalmic solution Place 1 drop into both eyes at bedtime.    . metFORMIN (GLUCOPHAGE-XR) 500 MG 24 hr tablet START 1 TABLET BY MOUTH DAILY EVERY EVENING 90 tablet 3  . metoprolol tartrate (LOPRESSOR) 25 MG tablet TAKE 1 TABLET BY MOUTH TWICE A DAY  180 tablet 1  . omeprazole (PRILOSEC) 10 MG capsule Take 10 mg by mouth daily.    Marland Kitchen SIMBRINZA 1-0.2 % SUSP     . traZODone (DESYREL) 50 MG tablet TAKE 2 TABLETS (100 MG TOTAL) BY MOUTH AT BEDTIME. 180 tablet 1  . VENTOLIN HFA 108 (90 Base) MCG/ACT inhaler TAKE 2 PUFFS BY MOUTH EVERY 6 HOURS AS NEEDED FOR WHEEZE OR SHORTNESS OF BREATH 18 g 2   No current facility-administered medications on file prior to visit.    Social History   Tobacco Use  . Smoking status: Never Smoker  . Smokeless tobacco: Never Used  Substance Use Topics  . Alcohol use: No  . Drug use: No    Review of Systems  Constitutional: Positive for fatigue. Negative for chills and fever.  HENT: Positive for hearing loss (chronic).   Respiratory: Negative for cough and shortness of breath.   Cardiovascular: Negative for chest pain and palpitations.  Gastrointestinal: Negative for nausea and vomiting.      Objective:    BP 130/78   Pulse 92   Temp 98.2 F (36.8 C)   Ht 4\' 11"  (1.499 m)   Wt 176 lb 6.4 oz (80 kg)   SpO2 95%   BMI 35.63 kg/m  BP Readings from  Last 3 Encounters:  09/01/20 130/78  08/14/20 (!) 136/49  08/07/20 128/64   Wt Readings from Last 3 Encounters:  09/01/20 176 lb 6.4 oz (80 kg)  07/24/20 178 lb (80.7 kg)  07/20/20 177 lb (80.3 kg)    Physical Exam Vitals reviewed.  Constitutional:      Appearance: She is well-developed.  Eyes:     Conjunctiva/sclera: Conjunctivae normal.  Cardiovascular:     Rate and Rhythm: Normal rate and regular rhythm.     Pulses: Normal pulses.     Heart sounds: Normal heart sounds.  Pulmonary:     Effort: Pulmonary effort is normal.     Breath sounds: Normal breath sounds. No wheezing, rhonchi or rales.  Skin:    General: Skin is warm and dry.  Neurological:     Mental Status: She is alert.  Psychiatric:        Speech: Speech normal.        Behavior: Behavior normal.        Thought Content: Thought content normal.        Assessment & Plan:    Problem List Items Addressed This Visit      Cardiovascular and Mediastinum   HTN (hypertension)    Stable, continue regimen.         Musculoskeletal and Integument   Osteoporosis    Advised of importance of dexa as we are treating osteoporosis and mammogram. We had scheduled them for her today but she declines screening at this time.         Other   Auditory hallucination    Unchanged. Emphasized my advice again to see ENT and pursue MRI Brain. She politely declines. Will continue to discuss.       Relevant Orders   RPR (Completed)   Iron deficiency anemia due to chronic blood loss    Appears improved in recent labs. She remains fatigued. She declines further work up for fatigue and would like to continue iron infusions. Advised to continue close follow up with me so we can monitor.       Relevant Orders   TSH (Completed)   IBC + Ferritin (Completed)    Other Visit Diagnoses    Need for immunization against influenza    -  Primary   Relevant Orders   Flu Vaccine QUAD High Dose(Fluad) (Completed)   Abnormal urine           I have discontinued Melanie Bird's meloxicam. I am also having her maintain her omeprazole, latanoprost, metFORMIN, diclofenac Sodium, Ventolin HFA, metoprolol tartrate, amLODipine, traZODone, Simbrinza, brimonidine, and alendronate.   No orders of the defined types were placed in this encounter.   Return precautions given.   Risks, benefits, and alternatives of the medications and treatment plan prescribed today were discussed, and patient expressed understanding.   Education regarding symptom management and diagnosis given to patient on AVS.  Continue to follow with Burnard Hawthorne, FNP for routine health maintenance.   Marjory Lies and I agreed with plan.   Mable Paris, FNP

## 2020-09-02 LAB — RPR: RPR Ser Ql: NONREACTIVE

## 2020-09-02 NOTE — Assessment & Plan Note (Signed)
Unchanged. Emphasized my advice again to see ENT and pursue MRI Brain. She politely declines. Will continue to discuss.

## 2020-09-02 NOTE — Assessment & Plan Note (Signed)
Stable, continue regimen. 

## 2020-09-02 NOTE — Assessment & Plan Note (Signed)
Appears improved in recent labs. She remains fatigued. She declines further work up for fatigue and would like to continue iron infusions. Advised to continue close follow up with me so we can monitor.

## 2020-09-03 ENCOUNTER — Other Ambulatory Visit: Payer: Self-pay | Admitting: Family

## 2020-09-03 DIAGNOSIS — F324 Major depressive disorder, single episode, in partial remission: Secondary | ICD-10-CM

## 2020-09-04 ENCOUNTER — Other Ambulatory Visit: Payer: Self-pay | Admitting: Family

## 2020-09-04 ENCOUNTER — Inpatient Hospital Stay: Payer: Medicare Other

## 2020-09-04 ENCOUNTER — Inpatient Hospital Stay (HOSPITAL_BASED_OUTPATIENT_CLINIC_OR_DEPARTMENT_OTHER): Payer: Medicare Other | Admitting: Internal Medicine

## 2020-09-04 ENCOUNTER — Telehealth: Payer: Self-pay

## 2020-09-04 ENCOUNTER — Inpatient Hospital Stay: Payer: Medicare Other | Attending: Internal Medicine

## 2020-09-04 ENCOUNTER — Encounter: Payer: Self-pay | Admitting: Internal Medicine

## 2020-09-04 ENCOUNTER — Other Ambulatory Visit: Payer: Self-pay

## 2020-09-04 VITALS — BP 142/73 | HR 72 | Resp 18

## 2020-09-04 DIAGNOSIS — Z7984 Long term (current) use of oral hypoglycemic drugs: Secondary | ICD-10-CM | POA: Insufficient documentation

## 2020-09-04 DIAGNOSIS — K219 Gastro-esophageal reflux disease without esophagitis: Secondary | ICD-10-CM | POA: Diagnosis not present

## 2020-09-04 DIAGNOSIS — D500 Iron deficiency anemia secondary to blood loss (chronic): Secondary | ICD-10-CM

## 2020-09-04 DIAGNOSIS — D509 Iron deficiency anemia, unspecified: Secondary | ICD-10-CM | POA: Diagnosis present

## 2020-09-04 DIAGNOSIS — I10 Essential (primary) hypertension: Secondary | ICD-10-CM | POA: Insufficient documentation

## 2020-09-04 DIAGNOSIS — E119 Type 2 diabetes mellitus without complications: Secondary | ICD-10-CM | POA: Diagnosis not present

## 2020-09-04 DIAGNOSIS — Z79899 Other long term (current) drug therapy: Secondary | ICD-10-CM | POA: Insufficient documentation

## 2020-09-04 DIAGNOSIS — Z791 Long term (current) use of non-steroidal anti-inflammatories (NSAID): Secondary | ICD-10-CM | POA: Insufficient documentation

## 2020-09-04 DIAGNOSIS — R7989 Other specified abnormal findings of blood chemistry: Secondary | ICD-10-CM | POA: Diagnosis not present

## 2020-09-04 DIAGNOSIS — R809 Proteinuria, unspecified: Secondary | ICD-10-CM

## 2020-09-04 LAB — CBC WITH DIFFERENTIAL/PLATELET
Abs Immature Granulocytes: 0.05 10*3/uL (ref 0.00–0.07)
Basophils Absolute: 0.1 10*3/uL (ref 0.0–0.1)
Basophils Relative: 1 %
Eosinophils Absolute: 0.1 10*3/uL (ref 0.0–0.5)
Eosinophils Relative: 1 %
HCT: 38.4 % (ref 36.0–46.0)
Hemoglobin: 11.4 g/dL — ABNORMAL LOW (ref 12.0–15.0)
Immature Granulocytes: 1 %
Lymphocytes Relative: 17 %
Lymphs Abs: 1.2 10*3/uL (ref 0.7–4.0)
MCH: 25.2 pg — ABNORMAL LOW (ref 26.0–34.0)
MCHC: 29.7 g/dL — ABNORMAL LOW (ref 30.0–36.0)
MCV: 85 fL (ref 80.0–100.0)
Monocytes Absolute: 0.5 10*3/uL (ref 0.1–1.0)
Monocytes Relative: 7 %
Neutro Abs: 5.2 10*3/uL (ref 1.7–7.7)
Neutrophils Relative %: 73 %
Platelets: 291 10*3/uL (ref 150–400)
RBC: 4.52 MIL/uL (ref 3.87–5.11)
RDW: 19.7 % — ABNORMAL HIGH (ref 11.5–15.5)
WBC: 7.2 10*3/uL (ref 4.0–10.5)
nRBC: 0 % (ref 0.0–0.2)

## 2020-09-04 LAB — URINE CULTURE
MICRO NUMBER:: 10947799
SPECIMEN QUALITY:: ADEQUATE

## 2020-09-04 MED ORDER — SODIUM CHLORIDE 0.9 % IV SOLN
Freq: Once | INTRAVENOUS | Status: AC
  Filled 2020-09-04: qty 250

## 2020-09-04 MED ORDER — AMOXICILLIN-POT CLAVULANATE 875-125 MG PO TABS: 1.0000 | ORAL_TABLET | Freq: Two times a day (BID) | ORAL | 0 refills | Status: AC

## 2020-09-04 MED ORDER — IRON SUCROSE 20 MG/ML IV SOLN
200.0000 mg | Freq: Once | INTRAVENOUS | Status: AC
  Administered 2020-09-04: 200 mg via INTRAVENOUS
  Filled 2020-09-04: qty 10

## 2020-09-04 MED ORDER — SODIUM CHLORIDE 0.9 % IV SOLN: 200.0000 mg | Freq: Once | INTRAVENOUS | Status: DC

## 2020-09-04 NOTE — Progress Notes (Signed)
[place] OFFICE PROGRESS NOTE  Patient Care Team: Burnard Hawthorne, FNP as PCP - General (Family Medicine) Dallas Schimke, MD as Consulting Physician (Internal Medicine) Cammie Sickle, MD as Consulting Physician (Hematology and Oncology)  Cancer Staging No matching staging information was found for the patient.   Oncology History   No history exists.   # IDA- ? Etiology Chronic Iron PO BID; AUG 2021- Hb-7.4; Ferritin 4.  # 2008- BONE MARROW-- NORMOCELLULAR MARROW WITH TRILINEAGE HEMATOPOIESIS, NON-PARATRABECULAR LYMPHOID AGGREGATES.\;  - ADEQUATE STORAGE IRON. PERIPHERAL BLOOD: - NORMOCHROMIC ANEMIA WITH POLYCHROMASIA. - MILD THROMBOCYTOSIS.    INTERVAL HISTORY: Hard of hearing/accompanied by daughter.  Melanie Bird 79 y.o.  female pleasant patient above history of Anemia  iron deficiency of unclear etiology- is here for follow-up.  Patient status post IV iron infusion noted to have improvement of her energy levels.  However she is not back to baseline.  Continues to have significant fatigue.  She continues to deny bloody stools black-colored stools.  Review of Systems  Constitutional: Positive for malaise/fatigue. Negative for chills, diaphoresis, fever and weight loss.  HENT: Negative for nosebleeds and sore throat.   Eyes: Negative for double vision.  Respiratory: Negative for cough, hemoptysis, sputum production, shortness of breath and wheezing.   Cardiovascular: Negative for chest pain, palpitations, orthopnea and leg swelling.  Gastrointestinal: Negative for abdominal pain, blood in stool, constipation, diarrhea, heartburn, melena, nausea and vomiting.  Genitourinary: Negative for dysuria, frequency and urgency.  Musculoskeletal: Positive for back pain and joint pain.  Skin: Negative.  Negative for itching and rash.  Neurological: Negative for dizziness, tingling, focal weakness, weakness and headaches.  Endo/Heme/Allergies: Does not  bruise/bleed easily.  Psychiatric/Behavioral: Negative for depression. The patient is not nervous/anxious and does not have insomnia.      PAST MEDICAL HISTORY :  Past Medical History:  Diagnosis Date  . Anemia   . GERD (gastroesophageal reflux disease)   . Hypertension     PAST SURGICAL HISTORY :   Past Surgical History:  Procedure Laterality Date  . PARATHYROIDECTOMY     09/2018    FAMILY HISTORY :   Family History  Problem Relation Age of Onset  . Diabetes Father   . Hypertension Sister   . Breast cancer Neg Hx     SOCIAL HISTORY:   Social History   Tobacco Use  . Smoking status: Never Smoker  . Smokeless tobacco: Never Used  Substance Use Topics  . Alcohol use: No  . Drug use: No    ALLERGIES:  has No Known Allergies.  MEDICATIONS:  Current Outpatient Medications  Medication Sig Dispense Refill  . alendronate (FOSAMAX) 70 MG tablet Take 70 mg by mouth once a week.     Marland Kitchen amLODipine (NORVASC) 5 MG tablet TAKE 1 TABLET BY MOUTH EVERY DAY 90 tablet 1  . brimonidine (ALPHAGAN) 0.2 % ophthalmic solution     . latanoprost (XALATAN) 0.005 % ophthalmic solution Place 1 drop into both eyes at bedtime.    . metFORMIN (GLUCOPHAGE-XR) 500 MG 24 hr tablet START 1 TABLET BY MOUTH DAILY EVERY EVENING 90 tablet 3  . metoprolol tartrate (LOPRESSOR) 25 MG tablet TAKE 1 TABLET BY MOUTH TWICE A DAY 180 tablet 1  . omeprazole (PRILOSEC) 10 MG capsule Take 10 mg by mouth daily.    Marland Kitchen SIMBRINZA 1-0.2 % SUSP     . traZODone (DESYREL) 50 MG tablet TAKE 2 TABLETS (100 MG TOTAL) BY MOUTH AT BEDTIME. [place]  tablet 1  . amoxicillin-clavulanate (AUGMENTIN) 875-125 MG tablet Take 1 tablet by mouth 2 (two) times daily for 7 days. (Patient not taking: Reported on 09/04/2020) 14 tablet 0  . diclofenac Sodium (VOLTAREN) 1 % GEL Apply 2-4 g topically 4 (four) times daily. 2 grams upper body and 4 grams lower body (Patient not taking: Reported on 09/04/2020) 100 g 2  . VENTOLIN HFA 108 (90 Base)  MCG/ACT inhaler TAKE 2 PUFFS BY MOUTH EVERY 6 HOURS AS NEEDED FOR WHEEZE OR SHORTNESS OF BREATH (Patient not taking: Reported on 09/04/2020) 18 g 2   No current facility-administered medications for this visit.    PHYSICAL EXAMINATION:  BP 129/73 (BP Location: Left Arm, Patient Position: Sitting, Cuff Size: Large)   Pulse 71   Temp 98.1 F (36.7 C) (Tympanic)   Resp 18   Ht 4\' 11"  (1.499 m)   Wt 176 lb (79.8 kg)   SpO2 100%   BMI 35.55 kg/m   Filed Weights   09/04/20 1343  Weight: 176 lb (79.8 kg)    Physical Exam Constitutional:      Comments: She is accompanied by daughter.  She is in a wheelchair.  HENT:     Head: Normocephalic and atraumatic.     Mouth/Throat:     Pharynx: No oropharyngeal exudate.  Eyes:     Pupils: Pupils are equal, round, and reactive to light.  Cardiovascular:     Rate and Rhythm: Normal rate and regular rhythm.  Pulmonary:     Effort: No respiratory distress.     Breath sounds: No wheezing.  Abdominal:     General: Bowel sounds are normal. There is no distension.     Palpations: Abdomen is soft. There is no mass.     Tenderness: There is no abdominal tenderness. There is no guarding or rebound.  Musculoskeletal:        General: No tenderness. Normal range of motion.     Cervical back: Normal range of motion and neck supple.  Skin:    General: Skin is warm.  Neurological:     Mental Status: She is alert and oriented to person, place, and time.  Psychiatric:        Mood and Affect: Affect normal.      LABORATORY DATA:  I have reviewed the data as listed    Component Value Date/Time   NA 139 07/20/2020 1227   K 4.6 07/20/2020 1227   CL 105 07/20/2020 1227   CO2 21 07/20/2020 1227   GLUCOSE 126 (H) 07/20/2020 1227   BUN 16 07/20/2020 1227   CREATININE 0.94 07/20/2020 1227   CALCIUM 8.6 07/20/2020 1227   PROT 6.6 07/23/2020 1013   ALBUMIN 3.9 07/20/2020 1656   AST 11 07/20/2020 1656   ALT 8 07/20/2020 1656   ALKPHOS 75  07/20/2020 1656   BILITOT 0.3 07/20/2020 1656   GFRNONAA >60 12/14/2017 1209   GFRAA >60 12/14/2017 1209    No results found for: SPEP, UPEP  Lab Results  Component Value Date   WBC 7.2 09/04/2020   NEUTROABS 5.2 09/04/2020   HGB 11.4 (L) 09/04/2020   HCT 38.4 09/04/2020   MCV 85.0 09/04/2020   PLT 291 09/04/2020      Chemistry      Component Value Date/Time   NA 139 07/20/2020 1227   K 4.6 07/20/2020 1227   CL 105 07/20/2020 1227   CO2 21 07/20/2020 1227   BUN 16 07/20/2020 1227   CREATININE 0.94 07/20/2020  1227      Component Value Date/Time   CALCIUM 8.6 07/20/2020 1227   ALKPHOS 75 07/20/2020 1656   AST 11 07/20/2020 1656   ALT 8 07/20/2020 1656   BILITOT 0.3 07/20/2020 1656       RADIOGRAPHIC STUDIES: I have personally reviewed the radiological images as listed and agreed with the findings in the report. No results found.   ASSESSMENT & PLAN:  Iron deficiency anemia due to chronic blood loss # Chronic-unclear etiology question. No obvious GI blood loss. Aug 2021-Hb 7.4; ferritin- 3; s/p Venofer-hemoglobin improved to 11.5 today.  Patient still very fatigued symptomatic.  Proceed with Venofer.  #Etiology is unclear; awaiting stool occult/PCP; negative.  Recommend further evaluation with GI for anemia.   #  DISPOSITION:  #  venofer today # venofer in 2 weeks # Follow up in 6 weeks- MD; labs- cbc;iron studies/ferritin; bmp venofer-Dr.B  Cc; Ms. Vidal Schwalbe     Orders Placed This Encounter  Procedures  . CBC with Differential/Platelet    Standing Status:   Future    Standing Expiration Date:   09/04/2021  . Basic metabolic panel    Standing Status:   Future    Standing Expiration Date:   09/04/2021  . Ferritin    Standing Status:   Future    Standing Expiration Date:   09/04/2021  . Iron and TIBC    Standing Status:   Future    Standing Expiration Date:   09/04/2021   All questions were answered. The patient knows to call the clinic with any problems,  questions or concerns.      Cammie Sickle, MD 09/06/2020 4:55 PM

## 2020-09-04 NOTE — Assessment & Plan Note (Addendum)
#   Chronic-unclear etiology question. No obvious GI blood loss. Aug 2021-Hb 7.4; ferritin- 3; s/p Venofer-hemoglobin improved to 11.5 today.  Patient still very fatigued symptomatic.  Proceed with Venofer.  #Etiology is unclear; awaiting stool occult/PCP; negative.  Recommend further evaluation with GI for anemia.   #  DISPOSITION:  #  venofer today # venofer in 2 weeks # Follow up in 6 weeks- MD; labs- cbc;iron studies/ferritin; bmp venofer-Dr.B  Cc; Ms. Vidal Schwalbe

## 2020-09-04 NOTE — Progress Notes (Signed)
Pt expressed extreme fatigue, weakness, and low energy.

## 2020-09-04 NOTE — Telephone Encounter (Signed)
I called & LM on patient's home phone to please call us back for lab results. Also called daughter Lanelle Bal, but she didn't answer VM was full.

## 2020-09-08 ENCOUNTER — Other Ambulatory Visit: Payer: Self-pay | Admitting: Family

## 2020-09-08 DIAGNOSIS — M25561 Pain in right knee: Secondary | ICD-10-CM

## 2020-09-09 NOTE — Telephone Encounter (Signed)
Pt returned your call.  

## 2020-09-09 NOTE — Telephone Encounter (Signed)
I have called patient & gave her lab results.

## 2020-09-18 ENCOUNTER — Other Ambulatory Visit: Payer: Self-pay

## 2020-09-18 ENCOUNTER — Inpatient Hospital Stay: Payer: Medicare Other | Attending: Internal Medicine

## 2020-09-18 VITALS — BP 120/70 | HR 78 | Temp 98.0°F

## 2020-09-18 DIAGNOSIS — I10 Essential (primary) hypertension: Secondary | ICD-10-CM | POA: Diagnosis not present

## 2020-09-18 DIAGNOSIS — Z79899 Other long term (current) drug therapy: Secondary | ICD-10-CM | POA: Diagnosis not present

## 2020-09-18 DIAGNOSIS — Z7984 Long term (current) use of oral hypoglycemic drugs: Secondary | ICD-10-CM | POA: Insufficient documentation

## 2020-09-18 DIAGNOSIS — D500 Iron deficiency anemia secondary to blood loss (chronic): Secondary | ICD-10-CM

## 2020-09-18 DIAGNOSIS — Z791 Long term (current) use of non-steroidal anti-inflammatories (NSAID): Secondary | ICD-10-CM | POA: Insufficient documentation

## 2020-09-18 DIAGNOSIS — R7989 Other specified abnormal findings of blood chemistry: Secondary | ICD-10-CM | POA: Diagnosis not present

## 2020-09-18 DIAGNOSIS — D509 Iron deficiency anemia, unspecified: Secondary | ICD-10-CM | POA: Diagnosis not present

## 2020-09-18 DIAGNOSIS — E119 Type 2 diabetes mellitus without complications: Secondary | ICD-10-CM | POA: Diagnosis not present

## 2020-09-18 DIAGNOSIS — K219 Gastro-esophageal reflux disease without esophagitis: Secondary | ICD-10-CM | POA: Diagnosis not present

## 2020-09-18 MED ORDER — SODIUM CHLORIDE 0.9 % IV SOLN
Freq: Once | INTRAVENOUS | Status: AC
  Filled 2020-09-18: qty 250

## 2020-09-18 MED ORDER — SODIUM CHLORIDE 0.9 % IV SOLN: 200.0000 mg | Freq: Once | INTRAVENOUS | Status: DC

## 2020-09-18 MED ORDER — IRON SUCROSE 20 MG/ML IV SOLN
200.0000 mg | Freq: Once | INTRAVENOUS | Status: AC
  Administered 2020-09-18: 200 mg via INTRAVENOUS
  Filled 2020-09-18: qty 10

## 2020-10-01 ENCOUNTER — Ambulatory Visit
Admission: RE | Admit: 2020-10-01 | Discharge: 2020-10-01 | Disposition: A | Discharge status: Home / Self Care | Payer: Medicare Other | Source: Ambulatory Visit | Attending: Family | Admitting: Family

## 2020-10-01 ENCOUNTER — Other Ambulatory Visit: Payer: Self-pay

## 2020-10-01 DIAGNOSIS — E119 Type 2 diabetes mellitus without complications: Secondary | ICD-10-CM | POA: Insufficient documentation

## 2020-10-01 DIAGNOSIS — Z13820 Encounter for screening for osteoporosis: Secondary | ICD-10-CM | POA: Diagnosis present

## 2020-10-01 DIAGNOSIS — E559 Vitamin D deficiency, unspecified: Secondary | ICD-10-CM | POA: Insufficient documentation

## 2020-10-01 DIAGNOSIS — M810 Age-related osteoporosis without current pathological fracture: Secondary | ICD-10-CM | POA: Insufficient documentation

## 2020-10-01 DIAGNOSIS — D649 Anemia, unspecified: Secondary | ICD-10-CM | POA: Diagnosis not present

## 2020-10-01 DIAGNOSIS — Z1231 Encounter for screening mammogram for malignant neoplasm of breast: Secondary | ICD-10-CM | POA: Diagnosis not present

## 2020-10-01 DIAGNOSIS — Z780 Asymptomatic menopausal state: Secondary | ICD-10-CM | POA: Diagnosis not present

## 2020-10-09 ENCOUNTER — Other Ambulatory Visit: Payer: Self-pay | Admitting: Family

## 2020-10-15 ENCOUNTER — Other Ambulatory Visit: Payer: Self-pay | Admitting: Family

## 2020-10-15 DIAGNOSIS — I10 Essential (primary) hypertension: Secondary | ICD-10-CM

## 2020-10-16 ENCOUNTER — Inpatient Hospital Stay (HOSPITAL_BASED_OUTPATIENT_CLINIC_OR_DEPARTMENT_OTHER): Payer: Medicare Other | Admitting: Internal Medicine

## 2020-10-16 ENCOUNTER — Inpatient Hospital Stay: Payer: Medicare Other

## 2020-10-16 ENCOUNTER — Encounter: Payer: Self-pay | Admitting: Internal Medicine

## 2020-10-16 ENCOUNTER — Other Ambulatory Visit: Payer: Self-pay

## 2020-10-16 VITALS — BP 146/65 | HR 86 | Resp 18

## 2020-10-16 DIAGNOSIS — D500 Iron deficiency anemia secondary to blood loss (chronic): Secondary | ICD-10-CM | POA: Diagnosis not present

## 2020-10-16 DIAGNOSIS — D509 Iron deficiency anemia, unspecified: Secondary | ICD-10-CM | POA: Diagnosis not present

## 2020-10-16 LAB — CBC WITH DIFFERENTIAL/PLATELET
Abs Immature Granulocytes: 0.05 10*3/uL (ref 0.00–0.07)
Basophils Absolute: 0.1 10*3/uL (ref 0.0–0.1)
Basophils Relative: 1 %
Eosinophils Absolute: 0 10*3/uL (ref 0.0–0.5)
Eosinophils Relative: 0 %
HCT: 41.3 % (ref 36.0–46.0)
Hemoglobin: 12.9 g/dL (ref 12.0–15.0)
Immature Granulocytes: 0 %
Lymphocytes Relative: 8 %
Lymphs Abs: 1 10*3/uL (ref 0.7–4.0)
MCH: 28 pg (ref 26.0–34.0)
MCHC: 31.2 g/dL (ref 30.0–36.0)
MCV: 89.6 fL (ref 80.0–100.0)
Monocytes Absolute: 0.4 10*3/uL (ref 0.1–1.0)
Monocytes Relative: 4 %
Neutro Abs: 9.8 10*3/uL — ABNORMAL HIGH (ref 1.7–7.7)
Neutrophils Relative %: 87 %
Platelets: 310 10*3/uL (ref 150–400)
RBC: 4.61 MIL/uL (ref 3.87–5.11)
RDW: 17.8 % — ABNORMAL HIGH (ref 11.5–15.5)
WBC: 11.4 10*3/uL — ABNORMAL HIGH (ref 4.0–10.5)
nRBC: 0 % (ref 0.0–0.2)

## 2020-10-16 LAB — BASIC METABOLIC PANEL
Anion gap: 12 (ref 5–15)
BUN: 20 mg/dL (ref 8–23)
CO2: 22 mmol/L (ref 22–32)
Calcium: 9 mg/dL (ref 8.9–10.3)
Chloride: 106 mmol/L (ref 98–111)
Creatinine, Ser: 0.99 mg/dL (ref 0.44–1.00)
GFR, Estimated: 57 mL/min — ABNORMAL LOW (ref >60)
Glucose, Bld: 219 mg/dL — ABNORMAL HIGH (ref 70–99)
Potassium: 3.9 mmol/L (ref 3.5–5.1)
Sodium: 140 mmol/L (ref 135–145)

## 2020-10-16 LAB — FERRITIN: Ferritin: 43 ng/mL (ref 11–307)

## 2020-10-16 LAB — IRON AND TIBC
Iron: 47 ug/dL (ref 28–170)
Saturation Ratios: 12 % (ref 10.4–31.8)
TIBC: 410 ug/dL (ref 250–450)
UIBC: 363 ug/dL

## 2020-10-16 MED ORDER — SODIUM CHLORIDE 0.9 % IV SOLN
Freq: Once | INTRAVENOUS | Status: AC
  Filled 2020-10-16: qty 250

## 2020-10-16 MED ORDER — IRON SUCROSE 20 MG/ML IV SOLN
200.0000 mg | Freq: Once | INTRAVENOUS | Status: AC
  Administered 2020-10-16: 200 mg via INTRAVENOUS
  Filled 2020-10-16: qty 10

## 2020-10-16 MED ORDER — SODIUM CHLORIDE 0.9 % IV SOLN: 200.0000 mg | Freq: Once | INTRAVENOUS | Status: DC

## 2020-10-16 NOTE — Progress Notes (Signed)
[facility] OFFICE PROGRESS NOTE  Patient Care Team: Burnard Hawthorne, FNP as PCP - General (Family Medicine) Dallas Schimke, MD as Consulting Physician (Internal Medicine) Cammie Sickle, MD as Consulting Physician (Hematology and Oncology)  Cancer Staging No matching staging information was found for the patient.   Oncology History   No history exists.   # IDA- ? Etiology Chronic Iron PO BID; AUG 2021- Hb-7.4; Ferritin 4.  # 2008- BONE MARROW-- NORMOCELLULAR MARROW WITH TRILINEAGE HEMATOPOIESIS, NON-PARATRABECULAR LYMPHOID AGGREGATES.\;  - ADEQUATE STORAGE IRON. PERIPHERAL BLOOD: - NORMOCHROMIC ANEMIA WITH POLYCHROMASIA. - MILD THROMBOCYTOSIS.    INTERVAL HISTORY: Hard of hearing/poor historian.  Alone.  Melanie Bird 79 y.o.  female pleasant patient above history of Anemia  iron deficiency of unclear etiology- is here for follow-up.  Patient status post IV iron infusion.  Improvement of energy levels.  Unfortunately continues to have fatigue.  Denies any blood in stools or black or stools.  No nausea vomiting.  Review of Systems  Constitutional: Positive for malaise/fatigue. Negative for chills, diaphoresis, fever and weight loss.  HENT: Negative for nosebleeds and sore throat.   Eyes: Negative for double vision.  Respiratory: Negative for cough, hemoptysis, sputum production, shortness of breath and wheezing.   Cardiovascular: Negative for chest pain, palpitations, orthopnea and leg swelling.  Gastrointestinal: Negative for abdominal pain, blood in stool, constipation, diarrhea, heartburn, melena, nausea and vomiting.  Genitourinary: Negative for dysuria, frequency and urgency.  Musculoskeletal: Positive for back pain and joint pain.  Skin: Negative.  Negative for itching and rash.  Neurological: Negative for dizziness, tingling, focal weakness, weakness and headaches.  Endo/Heme/Allergies: Does not bruise/bleed easily.   Psychiatric/Behavioral: Negative for depression. The patient is not nervous/anxious and does not have insomnia.      PAST MEDICAL HISTORY :  Past Medical History:  Diagnosis Date  . Anemia   . GERD (gastroesophageal reflux disease)   . Hypertension     PAST SURGICAL HISTORY :   Past Surgical History:  Procedure Laterality Date  . PARATHYROIDECTOMY     09/2018    FAMILY HISTORY :   Family History  Problem Relation Age of Onset  . Diabetes Father   . Hypertension Sister   . Breast cancer Neg Hx     SOCIAL HISTORY:   Social History   Tobacco Use  . Smoking status: Never Smoker  . Smokeless tobacco: Never Used  Substance Use Topics  . Alcohol use: No  . Drug use: No    ALLERGIES:  has No Known Allergies.  MEDICATIONS:  Current Outpatient Medications  Medication Sig Dispense Refill  . alendronate (FOSAMAX) 70 MG tablet Take 70 mg by mouth once a week.     Marland Kitchen amLODipine (NORVASC) 5 MG tablet TAKE 1 TABLET BY MOUTH EVERY DAY 90 tablet 1  . brimonidine (ALPHAGAN) 0.2 % ophthalmic solution     . diclofenac Sodium (VOLTAREN) 1 % GEL Apply 2-4 g topically 4 (four) times daily. 2 grams upper body and 4 grams lower body 100 g 2  . latanoprost (XALATAN) 0.005 % ophthalmic solution Place 1 drop into both eyes at bedtime.    . metFORMIN (GLUCOPHAGE-XR) 500 MG 24 hr tablet START 1 TABLET BY MOUTH DAILY EVERY EVENING 90 tablet 3  . metoprolol tartrate (LOPRESSOR) 25 MG tablet TAKE 1 TABLET BY MOUTH TWICE A DAY 180 tablet 1  . omeprazole (PRILOSEC) 10 MG capsule Take 10 mg by mouth daily.    Marland Kitchen SIMBRINZA 1-0.2 %  SUSP     . traZODone (DESYREL) 50 MG tablet TAKE 2 TABLETS (100 MG TOTAL) BY MOUTH AT BEDTIME. 180 tablet 1  . VENTOLIN HFA 108 (90 Base) MCG/ACT inhaler TAKE 2 PUFFS BY MOUTH EVERY 6 HOURS AS NEEDED FOR WHEEZE OR SHORTNESS OF BREATH (Patient not taking: Reported on 09/04/2020) 18 g 2   No current facility-administered medications for this visit.    PHYSICAL  EXAMINATION:  BP (!) 142/65   Pulse 94   Temp 98.7 F (37.1 C) (Tympanic)   Wt 171 lb (77.6 kg)   BMI 34.54 kg/m   Filed Weights   10/16/20 1347  Weight: 171 lb (77.6 kg)    Physical Exam Constitutional:      Comments: She is accompanied by daughter.  She is in a wheelchair.  HENT:     Head: Normocephalic and atraumatic.     Mouth/Throat:     Pharynx: No oropharyngeal exudate.  Eyes:     Pupils: Pupils are equal, round, and reactive to light.  Cardiovascular:     Rate and Rhythm: Normal rate and regular rhythm.  Pulmonary:     Effort: No respiratory distress.     Breath sounds: No wheezing.  Abdominal:     General: Bowel sounds are normal. There is no distension.     Palpations: Abdomen is soft. There is no mass.     Tenderness: There is no abdominal tenderness. There is no guarding or rebound.  Musculoskeletal:        General: No tenderness. Normal range of motion.     Cervical back: Normal range of motion and neck supple.  Skin:    General: Skin is warm.  Neurological:     Mental Status: She is alert and oriented to person, place, and time.  Psychiatric:        Mood and Affect: Affect normal.      LABORATORY DATA:  I have reviewed the data as listed    Component Value Date/Time   NA 140 10/16/2020 1306   K 3.9 10/16/2020 1306   CL 106 10/16/2020 1306   CO2 22 10/16/2020 1306   GLUCOSE 219 (H) 10/16/2020 1306   BUN 20 10/16/2020 1306   CREATININE 0.99 10/16/2020 1306   CALCIUM 9.0 10/16/2020 1306   PROT 6.6 07/23/2020 1013   ALBUMIN 3.9 07/20/2020 1656   AST 11 07/20/2020 1656   ALT 8 07/20/2020 1656   ALKPHOS 75 07/20/2020 1656   BILITOT 0.3 07/20/2020 1656   GFRNONAA 57 (L) 10/16/2020 1306   GFRAA >60 12/14/2017 1209    No results found for: SPEP, UPEP  Lab Results  Component Value Date   WBC 11.4 (H) 10/16/2020   NEUTROABS 9.8 (H) 10/16/2020   HGB 12.9 10/16/2020   HCT 41.3 10/16/2020   MCV 89.6 10/16/2020   PLT 310 10/16/2020       Chemistry      Component Value Date/Time   NA 140 10/16/2020 1306   K 3.9 10/16/2020 1306   CL 106 10/16/2020 1306   CO2 22 10/16/2020 1306   BUN 20 10/16/2020 1306   CREATININE 0.99 10/16/2020 1306      Component Value Date/Time   CALCIUM 9.0 10/16/2020 1306   ALKPHOS 75 07/20/2020 1656   AST 11 07/20/2020 1656   ALT 8 07/20/2020 1656   BILITOT 0.3 07/20/2020 1656       RADIOGRAPHIC STUDIES: I have personally reviewed the radiological images as listed and agreed with the findings in  the report. No results found.   ASSESSMENT & PLAN:  Iron deficiency anemia due to chronic blood loss # Chronic-unclear etiology question. No obvious GI blood loss. Aug 2021-Hb 7.4; ferritin- 3; s/p Venofer-hemoglobin improved to 12.9. Proceed with Venofer.  #Etiology is unclear-patient reluctant with GI evaluation.  Defer to PCP regarding GI evaluation.  #  DISPOSITION:  #  venofer today # Follow up in 3 months weeks- MD; labs- cbc;iron studies/ferritin; bmp venofer-Dr.B  Cc; Ms. Vidal Schwalbe     Orders Placed This Encounter  Procedures  . Basic metabolic panel    Standing Status:   Future    Standing Expiration Date:   10/19/2021  . CBC with Differential/Platelet    Standing Status:   Future    Standing Expiration Date:   10/19/2021  . Ferritin    Standing Status:   Future    Standing Expiration Date:   10/19/2021  . Iron and TIBC    Standing Status:   Future    Standing Expiration Date:   10/19/2021   All questions were answered. The patient knows to call the clinic with any problems, questions or concerns.      Cammie Sickle, MD 10/19/2020 10:04 AM

## 2020-10-16 NOTE — Assessment & Plan Note (Addendum)
#   Chronic-unclear etiology question. No obvious GI blood loss. Aug 2021-Hb 7.4; ferritin- 3; s/p Venofer-hemoglobin improved to 12.9. Proceed with Venofer.  #Etiology is unclear-patient reluctant with GI evaluation.  Defer to PCP regarding GI evaluation.  #  DISPOSITION:  #  venofer today # Follow up in 3 months weeks- MD; labs- cbc;iron studies/ferritin; bmp venofer-Dr.B  Cc; Ms. Vidal Schwalbe

## 2020-10-19 ENCOUNTER — Other Ambulatory Visit: Payer: Self-pay

## 2020-10-19 ENCOUNTER — Other Ambulatory Visit (INDEPENDENT_AMBULATORY_CARE_PROVIDER_SITE_OTHER): Payer: Medicare Other

## 2020-10-19 ENCOUNTER — Other Ambulatory Visit: Payer: Medicare Other

## 2020-10-19 DIAGNOSIS — R809 Proteinuria, unspecified: Secondary | ICD-10-CM | POA: Diagnosis not present

## 2020-10-19 LAB — MICROALBUMIN / CREATININE URINE RATIO
Creatinine,U: 198.1 mg/dL
Microalb Creat Ratio: 2.4 mg/g (ref 0.0–30.0)
Microalb, Ur: 4.7 mg/dL — ABNORMAL HIGH (ref 0.0–1.9)

## 2020-10-19 LAB — URINALYSIS, ROUTINE W REFLEX MICROSCOPIC
Bilirubin Urine: NEGATIVE
Hgb urine dipstick: NEGATIVE
Leukocytes,Ua: NEGATIVE
Nitrite: NEGATIVE
Specific Gravity, Urine: >=1.03 — AB (ref 1.000–1.030)
Total Protein, Urine: NEGATIVE
Urine Glucose: NEGATIVE
Urobilinogen, UA: 0.2 (ref 0.0–1.0)
pH: 5 (ref 5.0–8.0)

## 2020-10-19 NOTE — Addendum Note (Signed)
Addended by: Elpidio Galea T on: 10/19/2020 11:26 AM   Modules accepted: Orders

## 2020-10-22 ENCOUNTER — Ambulatory Visit: Payer: Medicare Other | Admitting: Gastroenterology

## 2020-10-23 ENCOUNTER — Other Ambulatory Visit: Payer: Self-pay | Admitting: Family

## 2020-10-23 DIAGNOSIS — G8929 Other chronic pain: Secondary | ICD-10-CM

## 2020-10-30 ENCOUNTER — Ambulatory Visit: Payer: Medicare Other | Admitting: Family

## 2020-12-21 ENCOUNTER — Other Ambulatory Visit: Payer: Self-pay | Admitting: Family

## 2020-12-21 DIAGNOSIS — F324 Major depressive disorder, single episode, in partial remission: Secondary | ICD-10-CM

## 2020-12-21 DIAGNOSIS — G8929 Other chronic pain: Secondary | ICD-10-CM

## 2021-01-15 ENCOUNTER — Inpatient Hospital Stay: Payer: Medicare Other | Admitting: Internal Medicine

## 2021-01-15 ENCOUNTER — Inpatient Hospital Stay: Payer: Medicare Other

## 2021-01-18 ENCOUNTER — Other Ambulatory Visit: Payer: Self-pay | Admitting: Family

## 2021-02-16 ENCOUNTER — Other Ambulatory Visit: Payer: Self-pay | Admitting: Family

## 2021-02-16 DIAGNOSIS — I10 Essential (primary) hypertension: Secondary | ICD-10-CM

## 2021-02-17 ENCOUNTER — Telehealth: Payer: Self-pay | Admitting: Internal Medicine

## 2021-02-17 NOTE — Telephone Encounter (Signed)
Pt just left a VM stating that her phone hasn't been working and she wasn't sure if she had any upcoming appts. She requested a call back.   Pt missed appts on 01/15/21. Message sent to team to advise.

## 2021-03-05 ENCOUNTER — Telehealth: Payer: Self-pay | Admitting: Internal Medicine

## 2021-03-05 NOTE — Telephone Encounter (Signed)
Pt called to cancel her appt for 3/21 lab/md/iron and did not want to reschedule at this time.  She will call back when ready to reschedule.

## 2021-03-08 ENCOUNTER — Inpatient Hospital Stay: Payer: Medicare Other

## 2021-03-08 ENCOUNTER — Inpatient Hospital Stay: Payer: Medicare Other | Admitting: Internal Medicine

## 2021-03-11 ENCOUNTER — Other Ambulatory Visit: Payer: Self-pay | Admitting: Family

## 2021-03-11 DIAGNOSIS — G8929 Other chronic pain: Secondary | ICD-10-CM

## 2021-06-04 ENCOUNTER — Other Ambulatory Visit: Payer: Self-pay | Admitting: Family

## 2021-06-04 DIAGNOSIS — M25561 Pain in right knee: Secondary | ICD-10-CM

## 2021-07-11 ENCOUNTER — Other Ambulatory Visit: Payer: Self-pay | Admitting: Family

## 2021-07-11 DIAGNOSIS — E119 Type 2 diabetes mellitus without complications: Secondary | ICD-10-CM

## 2021-07-11 DIAGNOSIS — I10 Essential (primary) hypertension: Secondary | ICD-10-CM

## 2021-07-19 ENCOUNTER — Ambulatory Visit: Payer: Medicare Other

## 2021-08-24 ENCOUNTER — Other Ambulatory Visit: Payer: Self-pay | Admitting: Family

## 2021-08-24 DIAGNOSIS — G8929 Other chronic pain: Secondary | ICD-10-CM

## 2021-09-08 ENCOUNTER — Other Ambulatory Visit: Payer: Self-pay | Admitting: Family

## 2021-09-10 ENCOUNTER — Other Ambulatory Visit: Payer: Self-pay | Admitting: Family

## 2021-09-10 DIAGNOSIS — G8929 Other chronic pain: Secondary | ICD-10-CM

## 2021-12-29 ENCOUNTER — Other Ambulatory Visit: Payer: Self-pay | Admitting: Family

## 2021-12-29 DIAGNOSIS — I10 Essential (primary) hypertension: Secondary | ICD-10-CM

## 2022-03-31 ENCOUNTER — Other Ambulatory Visit: Payer: Self-pay | Admitting: Family

## 2022-04-01 ENCOUNTER — Telehealth: Payer: Self-pay

## 2022-04-01 NOTE — Telephone Encounter (Signed)
I called and spoke with patient's daughter & she stated that she has had a heard time getting her mom to come in for appointments. Pt is scheduled for 5/12 to continue to send med refills. Both daughter & I tried to call patient to try to provide understanding on why she needs to be seen. Pt did not answer either. Pt's daughter does live with her & will talk with her when she gets home this evening to make her aware of appointment.  ?

## 2022-04-15 ENCOUNTER — Other Ambulatory Visit: Payer: Self-pay | Admitting: Family

## 2022-04-15 ENCOUNTER — Telehealth: Payer: Self-pay | Admitting: Family

## 2022-04-15 NOTE — Telephone Encounter (Signed)
Attempted to schedule AWV. Unable to LVM.  Will try at later time.  

## 2022-04-29 ENCOUNTER — Ambulatory Visit: Payer: Medicare Other | Admitting: Family

## 2022-05-12 ENCOUNTER — Inpatient Hospital Stay
Admission: EM | Admit: 2022-05-12 | Discharge: 2022-05-18 | DRG: 812 | Disposition: A | Discharge status: Skilled Nursing Facility | Payer: Medicare Other | Attending: Internal Medicine | Admitting: Internal Medicine

## 2022-05-12 ENCOUNTER — Other Ambulatory Visit: Payer: Self-pay

## 2022-05-12 ENCOUNTER — Emergency Department: Payer: Medicare Other

## 2022-05-12 ENCOUNTER — Inpatient Hospital Stay: Payer: Medicare Other

## 2022-05-12 DIAGNOSIS — Z833 Family history of diabetes mellitus: Secondary | ICD-10-CM | POA: Diagnosis not present

## 2022-05-12 DIAGNOSIS — K529 Noninfective gastroenteritis and colitis, unspecified: Secondary | ICD-10-CM | POA: Diagnosis present

## 2022-05-12 DIAGNOSIS — Z6833 Body mass index (BMI) 33.0-33.9, adult: Secondary | ICD-10-CM

## 2022-05-12 DIAGNOSIS — D649 Anemia, unspecified: Principal | ICD-10-CM | POA: Diagnosis present

## 2022-05-12 DIAGNOSIS — E860 Dehydration: Secondary | ICD-10-CM | POA: Diagnosis present

## 2022-05-12 DIAGNOSIS — Z8249 Family history of ischemic heart disease and other diseases of the circulatory system: Secondary | ICD-10-CM | POA: Diagnosis not present

## 2022-05-12 DIAGNOSIS — I10 Essential (primary) hypertension: Secondary | ICD-10-CM | POA: Diagnosis present

## 2022-05-12 DIAGNOSIS — Z79899 Other long term (current) drug therapy: Secondary | ICD-10-CM | POA: Diagnosis not present

## 2022-05-12 DIAGNOSIS — F0390 Unspecified dementia without behavioral disturbance: Secondary | ICD-10-CM | POA: Diagnosis present

## 2022-05-12 DIAGNOSIS — M1990 Unspecified osteoarthritis, unspecified site: Secondary | ICD-10-CM | POA: Diagnosis present

## 2022-05-12 DIAGNOSIS — R0902 Hypoxemia: Secondary | ICD-10-CM | POA: Diagnosis present

## 2022-05-12 DIAGNOSIS — Z7984 Long term (current) use of oral hypoglycemic drugs: Secondary | ICD-10-CM | POA: Diagnosis not present

## 2022-05-12 DIAGNOSIS — E119 Type 2 diabetes mellitus without complications: Secondary | ICD-10-CM | POA: Diagnosis present

## 2022-05-12 DIAGNOSIS — K219 Gastro-esophageal reflux disease without esophagitis: Secondary | ICD-10-CM | POA: Diagnosis present

## 2022-05-12 DIAGNOSIS — E669 Obesity, unspecified: Secondary | ICD-10-CM | POA: Diagnosis present

## 2022-05-12 DIAGNOSIS — N390 Urinary tract infection, site not specified: Secondary | ICD-10-CM | POA: Diagnosis present

## 2022-05-12 DIAGNOSIS — R410 Disorientation, unspecified: Secondary | ICD-10-CM | POA: Diagnosis present

## 2022-05-12 DIAGNOSIS — E872 Acidosis, unspecified: Secondary | ICD-10-CM | POA: Diagnosis present

## 2022-05-12 DIAGNOSIS — D509 Iron deficiency anemia, unspecified: Principal | ICD-10-CM | POA: Diagnosis present

## 2022-05-12 DIAGNOSIS — Z20822 Contact with and (suspected) exposure to covid-19: Secondary | ICD-10-CM | POA: Diagnosis present

## 2022-05-12 LAB — CBC
HCT: 22.3 % — ABNORMAL LOW (ref 36.0–46.0)
Hemoglobin: 6.4 g/dL — ABNORMAL LOW (ref 12.0–15.0)
MCH: 20.3 pg — ABNORMAL LOW (ref 26.0–34.0)
MCHC: 28.7 g/dL — ABNORMAL LOW (ref 30.0–36.0)
MCV: 70.6 fL — ABNORMAL LOW (ref 80.0–100.0)
Platelets: 430 10*3/uL — ABNORMAL HIGH (ref 150–400)
RBC: 3.16 MIL/uL — ABNORMAL LOW (ref 3.87–5.11)
RDW: 23 % — ABNORMAL HIGH (ref 11.5–15.5)
WBC: 8.1 10*3/uL (ref 4.0–10.5)
nRBC: 0.6 % — ABNORMAL HIGH (ref 0.0–0.2)

## 2022-05-12 LAB — CBC WITH DIFFERENTIAL/PLATELET
Abs Immature Granulocytes: 0.05 10*3/uL (ref 0.00–0.07)
Basophils Absolute: 0 10*3/uL (ref 0.0–0.1)
Basophils Relative: 0 %
Eosinophils Absolute: 0 10*3/uL (ref 0.0–0.5)
Eosinophils Relative: 0 %
HCT: 15.8 % — ABNORMAL LOW (ref 36.0–46.0)
Hemoglobin: 3.6 g/dL — CL (ref 12.0–15.0)
Immature Granulocytes: 1 %
Lymphocytes Relative: 13 %
Lymphs Abs: 1.3 10*3/uL (ref 0.7–4.0)
MCH: 15.3 pg — ABNORMAL LOW (ref 26.0–34.0)
MCHC: 22.8 g/dL — ABNORMAL LOW (ref 30.0–36.0)
MCV: 66.9 fL — ABNORMAL LOW (ref 80.0–100.0)
Monocytes Absolute: 0.8 10*3/uL (ref 0.1–1.0)
Monocytes Relative: 8 %
Neutro Abs: 7.8 10*3/uL — ABNORMAL HIGH (ref 1.7–7.7)
Neutrophils Relative %: 78 %
Platelets: 589 10*3/uL — ABNORMAL HIGH (ref 150–400)
RBC: 2.36 MIL/uL — ABNORMAL LOW (ref 3.87–5.11)
RDW: 21.5 % — ABNORMAL HIGH (ref 11.5–15.5)
Smear Review: NORMAL
WBC: 10 10*3/uL (ref 4.0–10.5)
nRBC: 0.3 % — ABNORMAL HIGH (ref 0.0–0.2)

## 2022-05-12 LAB — URINALYSIS, COMPLETE (UACMP) WITH MICROSCOPIC
Bilirubin Urine: NEGATIVE
Glucose, UA: NEGATIVE mg/dL
Hgb urine dipstick: NEGATIVE
Ketones, ur: NEGATIVE mg/dL
Nitrite: NEGATIVE
Protein, ur: NEGATIVE mg/dL
Specific Gravity, Urine: 1.017 (ref 1.005–1.030)
pH: 5 (ref 5.0–8.0)

## 2022-05-12 LAB — BASIC METABOLIC PANEL
Anion gap: 7 (ref 5–15)
BUN: 24 mg/dL — ABNORMAL HIGH (ref 8–23)
CO2: 22 mmol/L (ref 22–32)
Calcium: 8 mg/dL — ABNORMAL LOW (ref 8.9–10.3)
Chloride: 113 mmol/L — ABNORMAL HIGH (ref 98–111)
Creatinine, Ser: 0.88 mg/dL (ref 0.44–1.00)
GFR, Estimated: >60 mL/min (ref >60)
Glucose, Bld: 129 mg/dL — ABNORMAL HIGH (ref 70–99)
Potassium: 4.2 mmol/L (ref 3.5–5.1)
Sodium: 142 mmol/L (ref 135–145)

## 2022-05-12 LAB — COMPREHENSIVE METABOLIC PANEL
ALT: 13 U/L (ref 0–44)
AST: 26 U/L (ref 15–41)
Albumin: 3.1 g/dL — ABNORMAL LOW (ref 3.5–5.0)
Alkaline Phosphatase: 57 U/L (ref 38–126)
Anion gap: 11 (ref 5–15)
BUN: 35 mg/dL — ABNORMAL HIGH (ref 8–23)
CO2: 19 mmol/L — ABNORMAL LOW (ref 22–32)
Calcium: 8.2 mg/dL — ABNORMAL LOW (ref 8.9–10.3)
Chloride: 109 mmol/L (ref 98–111)
Creatinine, Ser: 1.23 mg/dL — ABNORMAL HIGH (ref 0.44–1.00)
GFR, Estimated: 44 mL/min — ABNORMAL LOW (ref >60)
Glucose, Bld: 235 mg/dL — ABNORMAL HIGH (ref 70–99)
Potassium: 4.1 mmol/L (ref 3.5–5.1)
Sodium: 139 mmol/L (ref 135–145)
Total Bilirubin: 0.6 mg/dL (ref 0.3–1.2)
Total Protein: 6.1 g/dL — ABNORMAL LOW (ref 6.5–8.1)

## 2022-05-12 LAB — PREPARE RBC (CROSSMATCH)

## 2022-05-12 LAB — TROPONIN I (HIGH SENSITIVITY)
Troponin I (High Sensitivity): 8 ng/L (ref <18)
Troponin I (High Sensitivity): 9 ng/L (ref <18)

## 2022-05-12 LAB — IRON AND TIBC
Iron: 17 ug/dL — ABNORMAL LOW (ref 28–170)
Saturation Ratios: 5 % — ABNORMAL LOW (ref 10.4–31.8)
TIBC: 364 ug/dL (ref 250–450)
UIBC: 347 ug/dL

## 2022-05-12 LAB — BLOOD GAS, VENOUS
Acid-base deficit: 6.5 mmol/L — ABNORMAL HIGH (ref 0.0–2.0)
Bicarbonate: 19.1 mmol/L — ABNORMAL LOW (ref 20.0–28.0)
O2 Saturation: 64.8 %
Patient temperature: 37
pCO2, Ven: 38 mmHg — ABNORMAL LOW (ref 44–60)
pH, Ven: 7.31 (ref 7.25–7.43)
pO2, Ven: 40 mmHg (ref 32–45)

## 2022-05-12 LAB — LACTIC ACID, PLASMA
Lactic Acid, Venous: 5.4 mmol/L (ref 0.5–1.9)
Lactic Acid, Venous: 1.5 mmol/L (ref 0.5–1.9)
Lactic Acid, Venous: 2.7 mmol/L (ref 0.5–1.9)
Lactic Acid, Venous: 1.4 mmol/L (ref 0.5–1.9)

## 2022-05-12 LAB — APTT: aPTT: 20 seconds — ABNORMAL LOW (ref 24–36)

## 2022-05-12 LAB — PROTIME-INR
INR: 1.1 (ref 0.8–1.2)
Prothrombin Time: 14.1 seconds (ref 11.4–15.2)

## 2022-05-12 LAB — HEMOGLOBIN A1C
Hgb A1c MFr Bld: 5.6 % (ref 4.8–5.6)
Mean Plasma Glucose: 114.02 mg/dL

## 2022-05-12 LAB — GLUCOSE, CAPILLARY
Glucose-Capillary: 199 mg/dL — ABNORMAL HIGH (ref 70–99)
Glucose-Capillary: 133 mg/dL — ABNORMAL HIGH (ref 70–99)

## 2022-05-12 LAB — SARS CORONAVIRUS 2 BY RT PCR: SARS Coronavirus 2 by RT PCR: NEGATIVE

## 2022-05-12 MED ORDER — SODIUM CHLORIDE 0.9 % IV SOLN
1.0000 g | INTRAVENOUS | Status: AC
  Administered 2022-05-12: 1 g via INTRAVENOUS
  Filled 2022-05-12 (×2): qty 10

## 2022-05-12 MED ORDER — IOHEXOL 300 MG/ML  SOLN
80.0000 mL | Freq: Once | INTRAMUSCULAR | Status: AC | PRN
  Administered 2022-05-12: 80 mL via INTRAVENOUS

## 2022-05-12 MED ORDER — PANTOPRAZOLE SODIUM 40 MG IV SOLR
40.0000 mg | INTRAVENOUS | Status: DC
  Administered 2022-05-12: 40 mg via INTRAVENOUS
  Filled 2022-05-12: qty 10

## 2022-05-12 MED ORDER — BRIMONIDINE TARTRATE 0.2 % OP SOLN
1.0000 [drp] | Freq: Three times a day (TID) | OPHTHALMIC | Status: DC
  Filled 2022-05-12: qty 5

## 2022-05-12 MED ORDER — ONDANSETRON HCL 4 MG/2ML IJ SOLN: 4.0000 mg | Freq: Four times a day (QID) | INTRAMUSCULAR | Status: DC | PRN

## 2022-05-12 MED ORDER — PANTOPRAZOLE SODIUM 40 MG IV SOLR
40.0000 mg | Freq: Two times a day (BID) | INTRAVENOUS | Status: DC
  Administered 2022-05-12 – 2022-05-18 (×12): 40 mg via INTRAVENOUS
  Filled 2022-05-12 (×12): qty 10

## 2022-05-12 MED ORDER — SODIUM CHLORIDE 0.9% FLUSH
3.0000 mL | Freq: Two times a day (BID) | INTRAVENOUS | Status: DC
  Administered 2022-05-12 – 2022-05-18 (×12): 3 mL via INTRAVENOUS

## 2022-05-12 MED ORDER — SODIUM CHLORIDE 0.9% FLUSH: 3.0000 mL | INTRAVENOUS | Status: DC | PRN

## 2022-05-12 MED ORDER — ONDANSETRON HCL 4 MG PO TABS: 4.0000 mg | ORAL_TABLET | Freq: Four times a day (QID) | ORAL | Status: DC | PRN

## 2022-05-12 MED ORDER — SODIUM CHLORIDE 0.9 % IV SOLN: INTRAVENOUS | Status: DC

## 2022-05-12 MED ORDER — LATANOPROST 0.005 % OP SOLN
1.0000 [drp] | Freq: Every day | OPHTHALMIC | Status: DC
  Administered 2022-05-12 – 2022-05-17 (×5): 1 [drp] via OPHTHALMIC
  Filled 2022-05-12: qty 2.5

## 2022-05-12 MED ORDER — LACTATED RINGERS IV BOLUS
500.0000 mL | Freq: Once | INTRAVENOUS | Status: AC
  Administered 2022-05-12: 500 mL via INTRAVENOUS

## 2022-05-12 MED ORDER — BRINZOLAMIDE 1 % OP SUSP
1.0000 [drp] | Freq: Three times a day (TID) | OPHTHALMIC | Status: DC
  Administered 2022-05-12 – 2022-05-18 (×15): 1 [drp] via OPHTHALMIC
  Filled 2022-05-12 (×2): qty 10

## 2022-05-12 MED ORDER — ACETAMINOPHEN 325 MG PO TABS
650.0000 mg | ORAL_TABLET | Freq: Four times a day (QID) | ORAL | Status: DC | PRN
Start: 2022-05-12 — End: 2022-05-18
  Administered 2022-05-13 – 2022-05-18 (×6): 650 mg via ORAL
  Filled 2022-05-12 (×6): qty 2

## 2022-05-12 MED ORDER — TRAZODONE HCL 50 MG PO TABS
100.0000 mg | ORAL_TABLET | Freq: Every day | ORAL | Status: DC
  Administered 2022-05-12 – 2022-05-17 (×5): 100 mg via ORAL
  Filled 2022-05-12 (×5): qty 1
  Filled 2022-05-12: qty 2

## 2022-05-12 MED ORDER — ACETAMINOPHEN 650 MG RE SUPP
650.0000 mg | Freq: Four times a day (QID) | RECTAL | Status: DC | PRN
Start: 2022-05-12 — End: 2022-05-18

## 2022-05-12 MED ORDER — SODIUM CHLORIDE 0.9% IV SOLUTION: Freq: Once | INTRAVENOUS | Status: AC

## 2022-05-12 MED ORDER — INSULIN ASPART 100 UNIT/ML IJ SOLN
0.0000 [IU] | Freq: Three times a day (TID) | INTRAMUSCULAR | Status: DC
  Administered 2022-05-12: 3 [IU] via SUBCUTANEOUS
  Administered 2022-05-13 (×3): 2 [IU] via SUBCUTANEOUS
  Administered 2022-05-14: 5 [IU] via SUBCUTANEOUS
  Administered 2022-05-15: 2 [IU] via SUBCUTANEOUS
  Administered 2022-05-15: 5 [IU] via SUBCUTANEOUS
  Administered 2022-05-15: 2 [IU] via SUBCUTANEOUS
  Administered 2022-05-16 (×3): 3 [IU] via SUBCUTANEOUS
  Administered 2022-05-17: 2 [IU] via SUBCUTANEOUS
  Administered 2022-05-17: 5 [IU] via SUBCUTANEOUS
  Administered 2022-05-18: 2 [IU] via SUBCUTANEOUS
  Administered 2022-05-18: 8 [IU] via SUBCUTANEOUS
  Filled 2022-05-12 (×15): qty 1

## 2022-05-12 MED ORDER — SODIUM CHLORIDE 0.9 % IV SOLN
10.0000 mL/h | Freq: Once | INTRAVENOUS | Status: AC
  Administered 2022-05-12: 10 mL/h via INTRAVENOUS

## 2022-05-12 MED ORDER — BRIMONIDINE TARTRATE 0.2 % OP SOLN
1.0000 [drp] | Freq: Three times a day (TID) | OPHTHALMIC | Status: DC
  Administered 2022-05-12 – 2022-05-18 (×15): 1 [drp] via OPHTHALMIC
  Filled 2022-05-12 (×2): qty 5

## 2022-05-12 MED ORDER — SODIUM CHLORIDE 0.9 % IV SOLN
1.0000 g | INTRAVENOUS | Status: DC
  Filled 2022-05-12: qty 10

## 2022-05-12 MED ORDER — SODIUM CHLORIDE 0.9 % IV SOLN: 250.0000 mL | INTRAVENOUS | Status: DC | PRN

## 2022-05-12 NOTE — Plan of Care (Signed)
Patient received from ED, A&O, VSS, no signed of acute distress, PRBC running, will continue to monitor Problem: Clinical Measurements: Goal: Ability to maintain clinical measurements within normal limits will improve Outcome: Progressing   Problem: Pain Managment: Goal: General experience of comfort will improve Outcome: Progressing   Problem: Safety: Goal: Ability to remain free from injury will improve Outcome: Progressing   Problem: Skin Integrity: Goal: Risk for impaired skin integrity will decrease Outcome: Progressing

## 2022-05-12 NOTE — Assessment & Plan Note (Signed)
Secondary to GI losses from diarrhea, nausea and vomiting Patient has a baseline serum creatinine of 0.9 but it is 1.2 during admission with a BUN of 35 Judicious IV fluid resuscitation Repeat renal parameters in a.m.

## 2022-05-12 NOTE — Progress Notes (Signed)
Admission profile updated. ?

## 2022-05-12 NOTE — Assessment & Plan Note (Signed)
Unclear etiology but patient has had several days of loose watery stools associated with nausea. We will send stool PCR Supportive care with antiemetics, IV fluid hydration, IV PPI She denies recent antibiotic use

## 2022-05-12 NOTE — ED Triage Notes (Signed)
Pt to ED [facility] from home for weakness past 1 week. Reportedly slid out of chair today, did not hit head. Ems reports foul smelling urine.  Pt AMS at baseline, answers orientation questions appropriately.  Pt keeps stating "just real tired"

## 2022-05-12 NOTE — Assessment & Plan Note (Addendum)
Most likely secondary to diarrhea as well as metformin use No evidence of sepsis at this time Expect improvement with hydration

## 2022-05-12 NOTE — ED Notes (Signed)
Pt daughter at bedside signed paper copy for blood transfusion consent, pt unable to sign d/t weakness and answering most but not all orientation questions appropriately.

## 2022-05-12 NOTE — Assessment & Plan Note (Signed)
BMI 28.83 kg/m2 Complicates overall prognosis and care Lifestyle modification and exercise has been discussed with patient in detail

## 2022-05-12 NOTE — Assessment & Plan Note (Signed)
Patient presents for evaluation of weakness and noted to have foul-smelling urine UA shows significant pyuria Prior urine culture yielded pansensitive E. Coli We will place patient on Rocephin 1 g IV daily Follow-up results of urine culture

## 2022-05-12 NOTE — Assessment & Plan Note (Addendum)
Patient presents for evaluation of weakness and is found to have severe microcytic anemia with a hemoglobin of 3.6g/dl compared to baseline of 12.9 g/dl about a year ago Patient takes meloxicam for arthritis and has also taken some naproxen Concern for possible NSAID induced gastritis resulting in chronic blood loss Obtain iron levels Start patient on Protonix 40 mg IV daily Transfuse 2 units of packed RBC We will request GI consult

## 2022-05-12 NOTE — ED Notes (Signed)
In and out performed with whitney RN

## 2022-05-12 NOTE — ED Notes (Signed)
Dr Jacqualine Code notified of lactic 5.4. orders to be placed as needed

## 2022-05-12 NOTE — Consult Note (Signed)
Cephas Darby, MD [address]  [place]  [place], [place] [address]  Main: [phone]  Fax: [phone] Pager: [phone]   Consultation  Referring Provider:     No ref. provider found Primary Care Physician:  Burnard Hawthorne, FNP Primary Gastroenterologist: Althia Forts        Reason for Consultation: Severe iron deficiency anemia  Date of Admission:  05/12/2022 Date of Consultation:  05/12/2022         HPI:   Melanie Bird is a 81 y.o. female with history of hypertension, arthritis, severe iron deficiency anemia, receives iron infusions as needed presented from home yesterday after a fall and progressively worsening weakness.  Patient's daughter lives with the patient and patient has been having poor p.o. intake, decreased mobility as well as worsening of her weakness.  Patient was not able to manage her ADLs.  Patient also tells me that she has not been taking iron pills.  Patient denies any abdominal pain, nausea or vomiting.  Patient's hemoglobin on arrival was 3.6, dropped from 12.9 since October 2021 Patient received 2 units of PRBCs by the time I saw her.  Patient's daughter is bedside.  Patient is lethargic laying in bed, able to answer simple questions  NSAIDs: None  Antiplts/Anticoagulants/Anti thrombotics: None  GI Procedures: Patient's daughter reports that she underwent upper endoscopy and colonoscopy for evaluation of iron deficiency anemia few years ago and no source was identified  Past Medical History:  Diagnosis Date   Anemia    GERD (gastroesophageal reflux disease)    Hypertension     Past Surgical History:  Procedure Laterality Date   PARATHYROIDECTOMY     09/2018    Current Facility-Administered Medications:    0.9 %  sodium chloride infusion, 250 mL, Intravenous, PRN, Agbata, Tochukwu, MD   0.9 %  sodium chloride infusion, , Intravenous, Continuous, Kayleen Memos, DO, Last Rate: 75 mL/hr at 05/12/22 2046, New Bag at 05/12/22  2046   acetaminophen (TYLENOL) tablet 650 mg, 650 mg, Oral, Q6H PRN **OR** acetaminophen (TYLENOL) suppository 650 mg, 650 mg, Rectal, Q6H PRN, Agbata, Tochukwu, MD   brinzolamide (AZOPT) 1 % ophthalmic suspension 1 drop, 1 drop, Both Eyes, TID, 1 drop at 05/12/22 2049 **AND** brimonidine (ALPHAGAN) 0.2 % ophthalmic solution 1 drop, 1 drop, Both Eyes, TID, Agbata, Tochukwu, MD, 1 drop at 05/12/22 2048   [START ON 05/13/2022] cefTRIAXone (ROCEPHIN) 1 g in sodium chloride 0.9 % 100 mL IVPB, 1 g, Intravenous, Q24H, Agbata, Tochukwu, MD   insulin aspart (novoLOG) injection 0-15 Units, 0-15 Units, Subcutaneous, TID WC, Agbata, Tochukwu, MD, 3 Units at 05/12/22 1752   latanoprost (XALATAN) 0.005 % ophthalmic solution 1 drop, 1 drop, Both Eyes, QHS, Agbata, Tochukwu, MD, 1 drop at 05/12/22 2048   ondansetron (ZOFRAN) tablet 4 mg, 4 mg, Oral, Q6H PRN **OR** ondansetron (ZOFRAN) injection 4 mg, 4 mg, Intravenous, Q6H PRN, Agbata, Tochukwu, MD   pantoprazole (PROTONIX) injection 40 mg, 40 mg, Intravenous, Q12H, Jaycob Mcclenton, Tally Due, MD, 40 mg at 05/12/22 2045   sodium chloride flush (NS) 0.9 % injection 3 mL, 3 mL, Intravenous, Q12H, Agbata, Tochukwu, MD, 3 mL at 05/12/22 2047   sodium chloride flush (NS) 0.9 % injection 3 mL, 3 mL, Intravenous, PRN, Agbata, Tochukwu, MD   traZODone (DESYREL) tablet 100 mg, 100 mg, Oral, QHS, Agbata, Tochukwu, MD, 100 mg at 05/12/22 2047  Family History  Problem Relation Age of Onset   Diabetes Father    Hypertension  Sister    Breast cancer Neg Hx      Social History   Tobacco Use   Smoking status: Never   Smokeless tobacco: Never  Substance Use Topics   Alcohol use: No   Drug use: No    Allergies as of 05/12/2022   (No Known Allergies)    Review of Systems:    All systems reviewed and negative except where noted in HPI.   Physical Exam:  Vital signs in last 24 hours: Temp:  [96.8 F (36 C)-98.4 F (36.9 C)] 98.4 F (36.9 C) (05/25 1539) Pulse Rate:   [88-100] 100 (05/25 1539) Resp:  [14-28] 17 (05/25 1539) BP: (91-141)/(29-55) 123/43 (05/25 1539) SpO2:  [98 %-100 %] 100 % (05/25 1539) Weight:  [76.1 kg-86.2 kg] 76.1 kg (05/25 1539)   General: Lethargic, cooperative in NAD Head:  Normocephalic and atraumatic. Eyes:   No icterus.   Conjunctiva pale. PERRLA. Ears:  Normal auditory acuity. Neck:  Supple; no masses or thyroidomegaly Lungs: Respirations even and unlabored. Lungs clear to auscultation bilaterally.   No wheezes, crackles, or rhonchi.  Heart:  Regular rate and rhythm;  Without murmur, clicks, rubs or gallops Abdomen:  Soft, nondistended, nontender. Normal bowel sounds. No appreciable masses or hepatomegaly.  No rebound or guarding.  Rectal:  Not performed. Msk:  Symmetrical without gross deformities.  Strength generalized weakness Extremities:  Without edema, cyanosis or clubbing. Neurologic:  Alert and oriented x1;  grossly normal neurologically. Skin:  Intact without significant lesions or rashes. Psych:  Alert and cooperative. Normal affect.  LAB RESULTS:    Latest Ref Rng & Units 05/12/2022    9:07 AM 10/16/2020    1:06 PM 09/04/2020    1:30 PM  CBC  WBC 4.0 - 10.5 K/uL 10.0   11.4   7.2    Hemoglobin 12.0 - 15.0 g/dL 3.6   12.9   11.4    Hematocrit 36.0 - 46.0 % 15.8   41.3   38.4    Platelets 150 - 400 K/uL 589   310   291      BMET    Latest Ref Rng & Units 05/12/2022    9:07 AM 10/16/2020    1:06 PM 07/20/2020   12:27 PM  BMP  Glucose 70 - 99 mg/dL 235   219   126    BUN 8 - 23 mg/dL 35   20   16    Creatinine 0.44 - 1.00 mg/dL 1.23   0.99   0.94    Sodium 135 - 145 mmol/L 139   140   139    Potassium 3.5 - 5.1 mmol/L 4.1   3.9   4.6    Chloride 98 - 111 mmol/L 109   106   105    CO2 22 - 32 mmol/L $RemoveB'19   22   21    'UJNemwwd$ Calcium 8.9 - 10.3 mg/dL 8.2   9.0   8.6      LFT    Latest Ref Rng & Units 05/12/2022    9:07 AM 07/23/2020   10:13 AM 07/20/2020    4:56 PM  Hepatic Function  Total Protein 6.5 - 8.1  g/dL 6.1   6.6   6.6    Albumin 3.5 - 5.0 g/dL 3.1    3.9    AST 15 - 41 U/L 26    11    ALT 0 - 44 U/L 13    8    Alk Phosphatase  38 - 126 U/L 57    75    Total Bilirubin 0.3 - 1.2 mg/dL 0.6    0.3    Bilirubin, Direct 0.0 - 0.3 mg/dL   0.1       STUDIES: CT Head Wo Contrast  Result Date: 05/12/2022 CLINICAL DATA:  Delirium. EXAM: CT HEAD WITHOUT CONTRAST TECHNIQUE: Contiguous axial images were obtained from the base of the skull through the vertex without intravenous contrast. RADIATION DOSE REDUCTION: This exam was performed according to the departmental dose-optimization program which includes automated exposure control, adjustment of the mA and/or kV according to patient size and/or use of iterative reconstruction technique. COMPARISON:  None Available. FINDINGS: Brain: No evidence of acute large vascular territory infarction, hemorrhage, hydrocephalus, extra-axial collection or mass lesion/mass effect. Advanced confluent patchy white matter hypoattenuation, nonspecific but compatible with chronic microvascular ischemic disease. Vascular: No hyperdense vessel identified. Calcific intracranial atherosclerosis. Skull: No acute fracture. Sinuses/Orbits: Clear sinuses.  No acute orbital findings. Other: No mastoid effusions. IMPRESSION: 1. No evidence of acute intracranial abnormality. 2. Advanced chronic microvascular ischemic disease, which could easily obscure a white matter infarct. If there is concern for acute infarct, recommend MRI for more sensitive evaluation. Electronically Signed   By: Margaretha Sheffield M.D.   On: 05/12/2022 11:08   CT ABDOMEN PELVIS W CONTRAST  Result Date: 05/12/2022 CLINICAL DATA:  Diarrhea. EXAM: CT ABDOMEN AND PELVIS WITH CONTRAST TECHNIQUE: Multidetector CT imaging of the abdomen and pelvis was performed using the standard protocol following bolus administration of intravenous contrast. RADIATION DOSE REDUCTION: This exam was performed according to the departmental  dose-optimization program which includes automated exposure control, adjustment of the mA and/or kV according to patient size and/or use of iterative reconstruction technique. CONTRAST:  53mL OMNIPAQUE IOHEXOL 300 MG/ML  SOLN COMPARISON:  None Available. FINDINGS: Lower chest: No acute abnormality. Hepatobiliary: Focal area of low attenuation within the lateral segment of left hepatic lobe adjacent to the falciform ligament is favored to represent focal fatty deposition. No suspicious liver lesions identified. Multiple gallstones are noted measuring up to 9 mm. No gallbladder wall inflammation or signs of bile duct dilatation. Pancreas: Unremarkable. No pancreatic ductal dilatation or surrounding inflammatory changes. Spleen: Normal in size without focal abnormality. Adrenals/Urinary Tract: Normal appearance of the right adrenal gland. Left adrenal nodule measures 4.0 by 3.2 cm with Hounsfield units on portal venous phase imaging measuring 19.0, image 25/2. On the delayed phase images Hounsfield units equal 9.53. 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 No follow-up imaging is recommended. Small stone within the upper pole of the right kidney measures 2 mm, image 33/2. No left renal calculi. No signs of hydronephrosis. Small foci of gas noted within the dome of urinary bladder. Bladder is otherwise unremarkable. Stomach/Bowel: Moderate size hiatal hernia. The appendix is visualized and appears normal. No bowel wall thickening, inflammation, or distension. Reproductive: Uterus and bilateral adnexa are unremarkable. Other: No free fluid or fluid collections identified. 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 Diffuse osteopenia. 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 IMPRESSION: 1. No acute findings within the abdomen or pelvis. 2. Small foci of gas noted within the dome of urinary bladder. Correlate for any clinical signs or symptoms of cystitis. 3. Gallstones. 4. Moderate size  hiatal hernia. Electronically Signed   By: Kerby Moors M.D.   On: 05/12/2022 14:47   DG Chest Port 1 View  Result Date: 05/12/2022 CLINICAL DATA:  Questionable sepsis - evaluate for abnormality EXAM: PORTABLE CHEST 1  VIEW COMPARISON:  November 2019 FINDINGS: No new consolidation or edema. No significant pleural effusion. No pneumothorax. Similar cardiomediastinal contours. Hiatal hernia. IMPRESSION: No acute process in the chest. Electronically Signed   By: Macy Mis M.D.   On: 05/12/2022 09:49      Impression / Plan:   Melanie Bird is a 81 y.o. female with history of hypertension, arthritis, known history of iron deficiency anemia of unclear etiology is admitted with severe symptomatic iron deficiency anemia with no evidence of active GI bleed as outpatient for evaluation of iron which were reportedly normal.  During this admission, both patient and her daughter are reluctant to undergo endoscopic evaluation given her overall medical condition Monitor CBC closely to maintain hemoglobin above 7 Continue Protonix 40 mg p.o./IV twice daily Defer endoscopic evaluation at this time as wished by both patient and her daughter  Thank you for involving me in the care of this patient.  GI will sign off at this time.  Please call GI back if the patient decides to undergo endoscopic evaluation    LOS: 0 days   Sherri Sear, MD  05/12/2022, 4:21 PM    Note: This dictation was prepared with Dragon dictation along with smaller phrase technology. Any transcriptional errors that result from this process are unintentional.

## 2022-05-12 NOTE — Assessment & Plan Note (Signed)
Place patient on a full liquid diet Glycemic control sliding scale insulin

## 2022-05-12 NOTE — ED Notes (Signed)
Placed on 2L [place] for comfort

## 2022-05-12 NOTE — ED Provider Notes (Signed)
[facility] Provider Note    Event Date/Time   First MD Initiated Contact with Patient 05/12/22 [id number]     (approximate)   History   Weakness   HPI  Melanie Bird is a 81 y.o. female who on review of oncology note from 2021 has a history of iron deficient anemia and hypertension  Patient presents via EMS, EMS reports that the patient lives with her daughter.  Daughter informing that the patient has been somewhat weak and today had an episode where she had to hold herself up against a chair in order to avoid falling because she felt very faint.  EMS reports a strong urine odor was noted and also that she was normotensive but her CO2 was noted to be in the 20s  Patient reports she has been feeling very tired for about a week.  No headache denies fall or injury.  Does not recall his episode this morning of almost fainting or possibly fainting.  Denies any injury or pain though no chest pain no trouble breathing.  She reports feeling just very weak all over, no focal weakness  Patient is oriented to month of May the year of 2023 and 2 herself.  She is oriented at this time  EMS reports that daughter on scene advised that patient has some slight concern for dementia, and seemed a little more confused than typical over the last couple of days     Physical Exam   Triage Vital Signs: ED Triage Vitals  Enc Vitals Group     BP 05/12/22 0909 (!) 127/51     Pulse Rate 05/12/22 0909 100     Resp 05/12/22 0909 18     Temp --      Temp src --      SpO2 05/12/22 0909 100 %     Weight 05/12/22 0908 190 lb (86.2 kg)     Height 05/12/22 0908 '4\' 11"'$  (1.499 m)     Head Circumference --      Peak Flow --      Pain Score 05/12/22 0908 0     Pain Loc --      Pain Edu? --      Excl. in [place]? --     Most recent vital signs: Vitals:   05/12/22 1136 05/12/22 1151  BP: (!) 105/29 (!) 120/44  Pulse: 90 90  Resp: 19 (!) 21  Temp: 97.9 F (36.6 C) 97.8 F (36.6 C)   SpO2:       General: Awake, no distress.  She does appear fatigued, in no acute distress but appears generally tired CV:  Good peripheral perfusion.  Normal cap refill.  Heart tones are normal Resp:  Normal effort.  Clear bilaterally.  Speaks without distress Abd:  No distention.  Soft nontender denies pain to palpation in any quadrant. Other:  Fatigue, 5 out of 5 strength in all extremities and no noted injuries with good range of motion extremities.  Speech is clear.  No facial droop.  Mucous membranes appear quite dry Hemoccult negative rectal  ED Results / Procedures / Treatments   Labs (all labs ordered are listed, but only abnormal results are displayed) Labs Reviewed  LACTIC ACID, PLASMA - Abnormal; Notable for the following components:      Result Value   Lactic Acid, Venous 5.4 (*)    All other components within normal limits  COMPREHENSIVE METABOLIC PANEL - Abnormal; Notable for the following components:   CO2  19 (*)    Glucose, Bld 235 (*)    BUN 35 (*)    Creatinine, Ser 1.23 (*)    Calcium 8.2 (*)    Total Protein 6.1 (*)    Albumin 3.1 (*)    GFR, Estimated 44 (*)    All other components within normal limits  CBC WITH DIFFERENTIAL/PLATELET - Abnormal; Notable for the following components:   RBC 2.36 (*)    Hemoglobin 3.6 (*)    HCT 15.8 (*)    MCV 66.9 (*)    MCH 15.3 (*)    MCHC 22.8 (*)    RDW 21.5 (*)    Platelets 589 (*)    nRBC 0.3 (*)    Neutro Abs 7.8 (*)    All other components within normal limits  APTT - Abnormal; Notable for the following components:   aPTT 20 (*)    All other components within normal limits  URINALYSIS, COMPLETE (UACMP) WITH MICROSCOPIC - Abnormal; Notable for the following components:   Color, Urine YELLOW (*)    APPearance HAZY (*)    Leukocytes,Ua MODERATE (*)    Bacteria, UA RARE (*)    All other components within normal limits  BLOOD GAS, VENOUS - Abnormal; Notable for the following components:   pCO2, Ven 38 (*)     Bicarbonate 19.1 (*)    Acid-base deficit 6.5 (*)    All other components within normal limits  SARS CORONAVIRUS 2 BY RT PCR  CULTURE, BLOOD (ROUTINE X 2)  CULTURE, BLOOD (ROUTINE X 2)  URINE CULTURE  PROTIME-INR  LACTIC ACID, PLASMA  TYPE AND SCREEN  PREPARE RBC (CROSSMATCH)  TROPONIN I (HIGH SENSITIVITY)  TROPONIN I (HIGH SENSITIVITY)   Labs interpreted for significant anemia  EKG  Reviewed inter by me at 910 Heart rate 100 QRS 90 QTc 460 Normal sinus rhythm, mild nonspecific T wave abnormality.  No evidence of acute ischemia   RADIOLOGY  I personally interpreted the patient's CT scan of the head for acute gross pathology, I do not see evidence of acute intracranial hemorrhage  CT Head Wo Contrast  Result Date: 05/12/2022 CLINICAL DATA:  Delirium. EXAM: CT HEAD WITHOUT CONTRAST TECHNIQUE: Contiguous axial images were obtained from the base of the skull through the vertex without intravenous contrast. RADIATION DOSE REDUCTION: This exam was performed according to the departmental dose-optimization program which includes automated exposure control, adjustment of the mA and/or kV according to patient size and/or use of iterative reconstruction technique. COMPARISON:  None Available. FINDINGS: Brain: No evidence of acute large vascular territory infarction, hemorrhage, hydrocephalus, extra-axial collection or mass lesion/mass effect. Advanced confluent patchy white matter hypoattenuation, nonspecific but compatible with chronic microvascular ischemic disease. Vascular: No hyperdense vessel identified. Calcific intracranial atherosclerosis. Skull: No acute fracture. Sinuses/Orbits: Clear sinuses.  No acute orbital findings. Other: No mastoid effusions. IMPRESSION: 1. No evidence of acute intracranial abnormality. 2. Advanced chronic microvascular ischemic disease, which could easily obscure a white matter infarct. If there is concern for acute infarct, recommend MRI for more sensitive  evaluation. Electronically Signed   By: Margaretha Sheffield M.D.   On: 05/12/2022 11:08   DG Chest Port 1 View  Result Date: 05/12/2022 CLINICAL DATA:  Questionable sepsis - evaluate for abnormality EXAM: PORTABLE CHEST 1 VIEW COMPARISON:  November 2019 FINDINGS: No new consolidation or edema. No significant pleural effusion. No pneumothorax. Similar cardiomediastinal contours. Hiatal hernia. IMPRESSION: No acute process in the chest. Electronically Signed   By: Addison Lank.D.  On: 05/12/2022 09:49    Chest x-ray reviewed negative for acute finding   PROCEDURES:  Critical Care performed: Yes, see critical care procedure note(s)  CRITICAL CARE Performed by: Delman Kitten   Total critical care time: 35 minutes  Critical care time was exclusive of separately billable procedures and treating other patients.  Critical care was necessary to treat or prevent imminent or life-threatening deterioration.  Critical care was time spent personally by me on the following activities: development of treatment plan with patient and/or surrogate as well as nursing, discussions with consultants, evaluation of patient's response to treatment, examination of patient, obtaining history from patient or surrogate, ordering and performing treatments and interventions, ordering and review of laboratory studies, ordering and review of radiographic studies, pulse oximetry and re-evaluation of patient's condition.   Procedures   MEDICATIONS ORDERED IN ED: Medications  cefTRIAXone (ROCEPHIN) 1 g in sodium chloride 0.9 % 100 mL IVPB (has no administration in time range)  lactated ringers bolus 500 mL (0 mLs Intravenous Stopped 05/12/22 1027)  0.9 %  sodium chloride infusion (10 mL/hr Intravenous New Bag/Given 05/12/22 1133)     IMPRESSION / MDM / ASSESSMENT AND PLAN / ED COURSE  I reviewed the triage vital signs and the nursing notes.                              Differential diagnosis includes, but is not  limited to, metabolic abnormalities, hyponatremia, hypokalemia, dehydration, possible sepsis for which sepsis lab panel has been sent, other causes including possibility etiology is of a near syncopal or possible syncopal episode.  Differential diagnosis quite broad, will evaluate broadly.  Patient does not recall the episode, and there is some report of possible confusion as well occurring earlier.  Now well oriented.  Normocephalic atraumatic no evidence of injury.  Proceed with obtaining imaging including CT of the head chest x-ray to evaluate broadly as to cause.  She does appear dry and fatigued and I suspect somewhat dehydrated based on very dry mucous membranes as well as her clinical presentation  Patient's presentation is most consistent with acute presentation with potential threat to life or bodily function.  The patient is on the cardiac monitor to evaluate for evidence of arrhythmia and/or significant heart rate changes.  Clinical Course as of 05/12/22 1218  Thu May 12, 2022  1059 Hemoglobin results are critically low.  Hemoccult negative on rectal testing.  Based on clinical history and also discussed with the daughter I suspect this may be chronic anemia that is severely symptomatic now also low MCV.  Used to receive iron transfusions but stopped [MQ]  1059 Discussed with patient and daughter, patient consented to blood transfusion.  I have ordered crossmatched blood at this point, as the patient normotensive alert obviously with severe anemia but I do not think emergent blood product is required unless significant delay and obtaining crossmatch [MQ]  1130 Blood bank advies blood ready. Transfusion to intiate asap [MQ]    Clinical Course User Index [MQ] Delman Kitten, MD   ----------------------------------------- 12:17 PM on 05/12/2022 ----------------------------------------- Labs reviewed, urine sent for culture.  We will also give empiric dose of Rocephin as I am somewhat  suspicious for possible UTI as bacteria are present leukocytes present.  However I suspect the driving cause for her presentation today and weakness is her anemia.  She also has associated lactic elevation, I suspect this is due to hypoperfusion due to severe  anemia and less so due to sepsis  Patient receiving blood transfusion at this time.  Alert.  Hemodynamics remain normal  Vitals:   05/12/22 1136 05/12/22 1151  BP: (!) 105/29 (!) 120/44  Pulse: 90 90  Resp: 19 (!) 21  Temp: 97.9 F (36.6 C) 97.8 F (36.6 C)  SpO2:      Consulted with hospitalist, patient will be admitted to hospitalist service, case discussed with Dr. Francine Graven  FINAL CLINICAL IMPRESSION(S) / ED DIAGNOSES   Final diagnoses:  Symptomatic anemia  Urinary tract infection, acute     Rx / DC Orders   ED Discharge Orders     None        Note:  This document was prepared using Dragon voice recognition software and may include unintentional dictation errors.   Delman Kitten, MD 05/12/22 1247

## 2022-05-12 NOTE — Assessment & Plan Note (Signed)
Patient is normotensive Hold amlodipine and metoprolol for now

## 2022-05-12 NOTE — ED Notes (Signed)
Dr Jacqualine Code notified of hgb 3.6. orders to be placed

## 2022-05-12 NOTE — H&P (Addendum)
History and Physical    Patient: Melanie Bird:[id number] DOB: [birth year] DOA: 05/12/2022 DOS: the patient was seen and examined on 05/12/2022 PCP: Burnard Hawthorne, FNP  Patient coming from: Home  Chief Complaint:  Chief Complaint  Patient presents with   Weakness   HPI: Melanie Bird is a 81 y.o. female with medical history significant for GERD, hypertension, arthritis and iron deficiency anemia requiring iron infusions who presents to the ER via EMS for evaluation of severe weakness and a fall at home on the day of admission. Patient's daughter states that she has been very weak and has had difficulty getting around over the last several weeks.  Her oral intake has been poor because she has felt nauseous and had an episode of emesis a couple of days prior to her admission.  She has also had multiple episodes of loose watery stools and has been incontinent of both stool and urine because she has difficulty getting to the bathroom due to weakness.  She states that her stools have been tan and denies seeing any frank blood or any dark stools.  She denies having any hematemesis.  She complains of generalized weakness but denies feeling dizzy or lightheaded.  She denies having any headache, no fever, no chills, no chest pain, no shortness of breath, no blurred vision no focal deficit. She denies having any urinary symptoms but by EMS she had very foul-smelling urine. On the day of admission she was trying to get into her recliner but missed it and slid to the floor.  She denies any loss of consciousness and did not hit her head.  Review of Systems: As mentioned in the history of present illness. All other systems reviewed and are negative. Past Medical History:  Diagnosis Date   Anemia    GERD (gastroesophageal reflux disease)    Hypertension    Past Surgical History:  Procedure Laterality Date   PARATHYROIDECTOMY     09/2018   Social History:  reports that she has never smoked.  She has never used smokeless tobacco. She reports that she does not drink alcohol and does not use drugs.  No Known Allergies  Family History  Problem Relation Age of Onset   Diabetes Father    Hypertension Sister    Breast cancer Neg Hx     Prior to Admission medications   Medication Sig Start Date End Date Taking? Authorizing Provider  amLODipine (NORVASC) 5 MG tablet TAKE 1 TABLET BY MOUTH EVERY DAY 12/29/21  Yes Arnett, Yvetta Coder, FNP  latanoprost (XALATAN) 0.005 % ophthalmic solution Place 1 drop into both eyes at bedtime. 02/06/19  Yes [provider]  meloxicam (MOBIC) 7.5 MG tablet TAKE 1 TABLET (7.5 MG TOTAL) BY MOUTH DAILY AS NEEDED FOR PAIN (SEVERE PAIN). 06/04/21  Yes Burnard Hawthorne, FNP  metFORMIN (GLUCOPHAGE-XR) 500 MG 24 hr tablet START 1 TABLET BY MOUTH DAILY EVERY EVENING 07/12/21  Yes Burnard Hawthorne, FNP  metoprolol tartrate (LOPRESSOR) 25 MG tablet TAKE 1 TABLET BY MOUTH TWICE A DAY 04/15/22  Yes Burnard Hawthorne, FNP  omeprazole (PRILOSEC) 10 MG capsule Take 10 mg by mouth daily.   Yes [provider]  traZODone (DESYREL) 50 MG tablet TAKE 2 TABLETS (100 MG TOTAL) BY MOUTH AT BEDTIME. 12/21/20  Yes Arnett, Yvetta Coder, FNP  brimonidine [facility]) 0.2 % ophthalmic solution  02/18/20   [provider]  diclofenac Sodium (VOLTAREN) 1 % GEL Apply 2-4 g topically 4 (four) times daily.  2 grams upper body and 4 grams lower body 11/27/19   McLean-Scocuzza, Nino Glow, MD  SIMBRINZA 1-0.2 % SUSP  01/27/20   [provider]  VENTOLIN HFA 108 (90 Base) MCG/ACT inhaler TAKE 2 PUFFS BY MOUTH EVERY 6 HOURS AS NEEDED FOR WHEEZE OR SHORTNESS OF BREATH Patient not taking: No sig reported 01/30/20   Burnard Hawthorne, FNP    Physical Exam: Vitals:   05/12/22 1151 05/12/22 1321 05/12/22 1330 05/12/22 1430  BP: (!) 120/44 (!) 114/52 (!) 134/45 (!) 138/48  Pulse: 90 88 95 92  Resp: (!) 21 14 (!) 22 14  Temp: 97.8 F (36.6 C)   98.2 F (36.8 C)   TempSrc: Oral Oral  Oral  SpO2:   99% 99%  Weight:      Height:       Physical Exam Vitals and nursing note reviewed.  Constitutional:      Appearance: Normal appearance.     Comments: Chronically ill-appearing  HENT:     Head: Normocephalic and atraumatic.     Nose: Nose normal.     Mouth/Throat:     Mouth: Mucous membranes are dry.  Eyes:     Comments: Pale conjunctiva  Cardiovascular:     Rate and Rhythm: Normal rate and regular rhythm.  Pulmonary:     Effort: Pulmonary effort is normal.     Breath sounds: Normal breath sounds.  Abdominal:     General: Bowel sounds are normal.     Palpations: Abdomen is soft.     Comments: Central adiposity  Musculoskeletal:        General: Normal range of motion.     Cervical back: Normal range of motion and neck supple.  Skin:    General: Skin is warm and dry.  Neurological:     Mental Status: She is alert.     Motor: Weakness present.  Psychiatric:        Mood and Affect: Mood normal.        Behavior: Behavior normal.   Visit to ER providers today Data Reviewed: Relevant notes from primary care and specialist visits, past discharge summaries as available in EHR, including Care Everywhere. Prior diagnostic testing as pertinent to current admission diagnoses Updated medications and problem lists for reconciliation ED course, including vitals, labs, imaging, treatment and response to treatment Triage notes, nursing and pharmacy notes and ED provider's notes Notable results as noted in HPI Labs reviewed.  White count 10.0, hemoglobin 3.6, hematocrit 15.8, MCV 66.9, RDW 21.5, platelet count 589, lactic acid 5.4, sodium 139, potassium 4.1, chloride 109, bicarb 19, glucose 235, BUN 35, creatinine 1.23 compared to baseline of 0.99, calcium 8.2, total protein 6.1, albumin 3.1, AST 26, ALT 13 Urine analysis shows pyuria Chest x-ray reviewed by me shows no evidence of acute cardiopulmonary disease CT scan of the head without contrast  shows no evidence of acute intracranial abnormality.Advanced chronic microvascular ischemic disease, which could easily obscure a white matter infarct. Twelve-lead EKG shows sinus rhythm with low voltage precordial leads There are no new results to review at this time.  Assessment and Plan: * Symptomatic anemia Patient presents for evaluation of weakness and is found to have severe microcytic anemia with a hemoglobin of 3.6g/dl compared to baseline of 12.9 g/dl about a year ago Patient takes meloxicam for arthritis and has also taken some naproxen Concern for possible NSAID induced gastritis resulting in chronic blood loss Obtain iron levels Start patient on Protonix 40 mg IV daily  Transfuse 2 units of packed RBC We will request GI consult   Gastroenteritis Unclear etiology but patient has had several days of loose watery stools associated with nausea. We will send stool PCR Supportive care with antiemetics, IV fluid hydration, IV PPI She denies recent antibiotic use  UTI (urinary tract infection) Patient presents for evaluation of weakness and noted to have foul-smelling urine UA shows significant pyuria Prior urine culture yielded pansensitive E. Coli We will place patient on Rocephin 1 g IV daily Follow-up results of urine culture  Obesity BMI 99.77 kg/m2 Complicates overall prognosis and care Lifestyle modification and exercise has been discussed with patient in detail  Lactic acidosis Most likely secondary to diarrhea as well as metformin use No evidence of sepsis at this time Expect improvement with hydration  Dehydration Secondary to GI losses from diarrhea, nausea and vomiting Patient has a baseline serum creatinine of 0.9 but it is 1.2 during admission with a BUN of 35 Judicious IV fluid resuscitation Repeat renal parameters in a.m.   Diabetes mellitus without complication ([place]) Place patient on a full liquid diet Glycemic control sliding scale insulin  HTN  (hypertension) Patient is normotensive Hold amlodipine and metoprolol for now      Advance Care Planning:   Code Status: Full Code   Consults: Gastroenterology  Family Communication: Greater than 50% of time was spent discussing patient's condition and plan of care with her and her daughter at the bedside.  All questions and concerns have been addressed.  They verbalized understanding and agreement with the plan.  CODE STATUS was discussed and she wishes to be a full code.  Severity of Illness: The appropriate patient status for this patient is INPATIENT. Inpatient status is judged to be reasonable and necessary in order to provide the required intensity of service to ensure the patient's safety. The patient's presenting symptoms, physical exam findings, and initial radiographic and laboratory data in the context of their chronic comorbidities is felt to place them at high risk for further clinical deterioration. Furthermore, it is not anticipated that the patient will be medically stable for discharge from the hospital within 2 midnights of admission.   * I certify that at the point of admission it is my clinical judgment that the patient will require inpatient hospital care spanning beyond 2 midnights from the point of admission due to high intensity of service, high risk for further deterioration and high frequency of surveillance required.*  Author: Collier Bullock, MD 05/12/2022 2:36 PM  For on call review [email or web address].

## 2022-05-13 ENCOUNTER — Telehealth: Payer: Self-pay | Admitting: Family

## 2022-05-13 ENCOUNTER — Other Ambulatory Visit: Payer: Self-pay

## 2022-05-13 DIAGNOSIS — E860 Dehydration: Secondary | ICD-10-CM

## 2022-05-13 DIAGNOSIS — E872 Acidosis, unspecified: Secondary | ICD-10-CM

## 2022-05-13 DIAGNOSIS — E119 Type 2 diabetes mellitus without complications: Secondary | ICD-10-CM

## 2022-05-13 DIAGNOSIS — I10 Essential (primary) hypertension: Secondary | ICD-10-CM

## 2022-05-13 LAB — BASIC METABOLIC PANEL
Anion gap: 7 (ref 5–15)
BUN: 15 mg/dL (ref 8–23)
CO2: 22 mmol/L (ref 22–32)
Calcium: 8.2 mg/dL — ABNORMAL LOW (ref 8.9–10.3)
Chloride: 110 mmol/L (ref 98–111)
Creatinine, Ser: 0.88 mg/dL (ref 0.44–1.00)
GFR, Estimated: >60 mL/min (ref >60)
Glucose, Bld: 137 mg/dL — ABNORMAL HIGH (ref 70–99)
Potassium: 3.8 mmol/L (ref 3.5–5.1)
Sodium: 139 mmol/L (ref 135–145)

## 2022-05-13 LAB — CBC
HCT: 28.8 % — ABNORMAL LOW (ref 36.0–46.0)
Hemoglobin: 8.4 g/dL — ABNORMAL LOW (ref 12.0–15.0)
MCH: 21.6 pg — ABNORMAL LOW (ref 26.0–34.0)
MCHC: 29.2 g/dL — ABNORMAL LOW (ref 30.0–36.0)
MCV: 74 fL — ABNORMAL LOW (ref 80.0–100.0)
Platelets: 448 10*3/uL — ABNORMAL HIGH (ref 150–400)
RBC: 3.89 MIL/uL (ref 3.87–5.11)
RDW: 22.9 % — ABNORMAL HIGH (ref 11.5–15.5)
WBC: 8.7 10*3/uL (ref 4.0–10.5)
nRBC: 0.6 % — ABNORMAL HIGH (ref 0.0–0.2)

## 2022-05-13 LAB — URINE CULTURE: Culture: NO GROWTH

## 2022-05-13 LAB — GLUCOSE, CAPILLARY
Glucose-Capillary: 141 mg/dL — ABNORMAL HIGH (ref 70–99)
Glucose-Capillary: 143 mg/dL — ABNORMAL HIGH (ref 70–99)
Glucose-Capillary: 126 mg/dL — ABNORMAL HIGH (ref 70–99)
Glucose-Capillary: 128 mg/dL — ABNORMAL HIGH (ref 70–99)

## 2022-05-13 MED ORDER — MELATONIN 5 MG PO TABS
5.0000 mg | ORAL_TABLET | ORAL | Status: AC
  Administered 2022-05-13: 5 mg via ORAL
  Filled 2022-05-13: qty 1

## 2022-05-13 MED ORDER — QUETIAPINE FUMARATE 25 MG PO TABS
12.5000 mg | ORAL_TABLET | ORAL | Status: AC
  Administered 2022-05-13: 12.5 mg via ORAL
  Filled 2022-05-13: qty 1

## 2022-05-13 MED ORDER — METOPROLOL TARTRATE 25 MG PO TABS
25.0000 mg | ORAL_TABLET | Freq: Two times a day (BID) | ORAL | Status: DC
  Administered 2022-05-13 (×2): 25 mg via ORAL
  Filled 2022-05-13 (×3): qty 1

## 2022-05-13 MED ORDER — AMLODIPINE BESYLATE 5 MG PO TABS
5.0000 mg | ORAL_TABLET | Freq: Every day | ORAL | Status: DC
  Administered 2022-05-13 – 2022-05-18 (×6): 5 mg via ORAL
  Filled 2022-05-13 (×6): qty 1

## 2022-05-13 NOTE — Progress Notes (Signed)
PROGRESS NOTE  Melanie Bird    DOB: 09-Jul-1941, 81 y.o.  OZD:[id number]    Code Status: Full Code   DOA: 05/12/2022   LOS: 1   Brief hospital course  Melanie Bird is a 81 y.o. female with a PMH significant for GERD, hypertension, arthritis and iron deficiency anemia requiring iron infusions.  They presented from home to the ED on 05/12/2022 with weakness with N/V and bowel incontinence of loose stool x about 3 days. She denies seeing blood in stool or emesis.  In the ED, it was found that they had stable vital signs with exception of hypotension as low as 115/33 (which appears to be chronically low diastolics but not this severe).  Significant findings included: WBC 10.0, hgb 3.6, hematocrit 15.8, MCV 66.9, RDW 21.5, platelet count 589, lactic acid 5.4, sodium 139, potassium 4.1, chloride 109, bicarb 19, glucose 235, BUN 35, creatinine 1.23 compared to baseline of 0.99, calcium 8.2, total protein 6.1, albumin 3.1, AST 26, ALT 13 Urine analysis shows pyuria. Chest x-ray negative acute disease CT head without contrast shows no evidence of acute intracranial abnormality. Advanced chronic microvascular ischemic disease, which could easily obscure a white matter infarct. CT abdomen negative for acute findings. Twelve-lead EKG shows sinus rhythm with low voltage precordial leads  They were initially treated with IV PPI, IV fluid hydration, CTX for suspected UTI. She received 3 units pRBCs overnight. Hgb 3.6>8.4  GI was consulted for evaluation.  Patient was admitted to medicine service for further workup and management of symptomatic anemia as outlined in detail below.  05/13/22 -stable  Assessment & Plan  Principal Problem:   Symptomatic anemia Active Problems:   Gastroenteritis   UTI (urinary tract infection)   Obesity   Lactic acidosis   HTN (hypertension)   Diabetes mellitus without complication (HCC)   Dehydration  Symptomatic anemia  presumed GI bleed- hgb 3.6>8.4 s/p 3  units pRBCs. No reported blood seen in stool or emesis prior to admission. Evaluated by GI and patient and daughter declined endoscopic evaluation at this time. Thought to be related to NSAID use vs malignancy vs diverticular. Had one formed stool without blood since admission.  - GI s/o - continue Protonix 40 mg IV BID - CBC am - continue IV fluids for a few more hours and then cut off as patient tolerates more PO - anti-emetics PRN - PT/OT tomorrow   UTI- patient denied urinary symptoms. Received CTX in ED - discontinue Abx at this time   Obesity BMI 25.95 kg/m2 Complicates overall prognosis and care Lifestyle modification and exercise has been discussed with patient in detail   Lactic acidosis- resolved. 2.7>1,4 Most likely secondary to diarrhea as well as metformin use No evidence of sepsis at this time   Diabetes mellitus without complication (HCC) - Glycemic control sliding scale insulin   HTN- poorly controlled.  - restart home amlodipine and metoprolol  Body mass index is 33.89 kg/m.  VTE ppx: SCDs Start: 05/12/22 1335  Diet:     Diet   Diet Carb Modified Fluid consistency: Thin; Room service appropriate? Yes   Consultants: GI Subjective 05/13/22    Pt reports needs to have a BM. Able to tolerate breakfast. No other complaints.    Objective   Vitals:   05/12/22 2330 05/13/22 0032 05/13/22 0051 05/13/22 0344  BP: (!) 141/62 (!) 146/52 (!) 139/59 132/60  Pulse: (!) 101 95 93 96  Resp: '20 18 16 20  '$ Temp: 98 F (36.7 C)  98.9 F (37.2 C) 98.4 F (36.9 C) 98.5 F (36.9 C)  TempSrc:  Oral Oral Oral  SpO2: 93% 97% 98% 94%  Weight:      Height:        Intake/Output Summary (Last 24 hours) at 05/13/2022 0751 Last data filed at 05/13/2022 0409 Gross per 24 hour  Intake 1554.67 ml  Output 625 ml  Net 929.67 ml   Filed Weights   05/12/22 0908 05/12/22 1539  Weight: 86.2 kg 76.1 kg     Physical Exam:  General: awake, alert, mild distress for needing to  use bathroom urgently HEENT: atraumatic, clear conjunctiva, anicteric sclera, MMM, hearing grossly normal Respiratory: normal respiratory effort. Cardiovascular: quick capillary refill Nervous: A&O x3. no gross focal neurologic deficits, normal speech Extremities: moves all equally, no edema, normal tone Skin: dry, intact, normal temperature, normal color. No rashes, lesions or ulcers on exposed skin  Labs   I have personally reviewed the following labs and imaging studies CBC    Component Value Date/Time   WBC 8.1 05/12/2022 2153   RBC 3.16 (L) 05/12/2022 2153   HGB 6.4 (L) 05/12/2022 2153   HGB 14.7 09/29/2014 1526   HGB 10.6 (L) 01/12/2012 1155   HCT 22.3 (L) 05/12/2022 2153   HCT 29.1 (L) 11/10/2015 1227   HCT 33.7 (L) 01/12/2012 1155   PLT 430 (H) 05/12/2022 2153   PLT 250 09/29/2014 1526   PLT 485 (H) 01/12/2012 1155   MCV 70.6 (L) 05/12/2022 2153   MCV 93 09/29/2014 1526   MCV 90.1 01/12/2012 1155   MCH 20.3 (L) 05/12/2022 2153   MCHC 28.7 (L) 05/12/2022 2153   RDW 23.0 (H) 05/12/2022 2153   RDW 14.3 09/29/2014 1526   RDW 15.3 (H) 01/12/2012 1155   LYMPHSABS 1.3 05/12/2022 0907   LYMPHSABS 1.8 09/29/2014 1526   LYMPHSABS 1.1 01/12/2012 1155   MONOABS 0.8 05/12/2022 0907   MONOABS 0.7 09/29/2014 1526   MONOABS 0.5 01/12/2012 1155   EOSABS 0.0 05/12/2022 0907   EOSABS 0.2 09/29/2014 1526   EOSABS 0.2 08/24/2010 1028   BASOSABS 0.0 05/12/2022 0907   BASOSABS 0.2 (H) 09/29/2014 1526   BASOSABS 0.0 01/12/2012 1155      Latest Ref Rng & Units 05/12/2022    9:53 PM 05/12/2022    9:07 AM 10/16/2020    1:06 PM  BMP  Glucose 70 - 99 mg/dL 129   235   219    BUN 8 - 23 mg/dL 24   35   20    Creatinine 0.44 - 1.00 mg/dL 0.88   1.23   0.99    Sodium 135 - 145 mmol/L 142   139   140    Potassium 3.5 - 5.1 mmol/L 4.2   4.1   3.9    Chloride 98 - 111 mmol/L 113   109   106    CO2 22 - 32 mmol/L '22   19   22    '$ Calcium 8.9 - 10.3 mg/dL 8.0   8.2   9.0      CT Head Wo  Contrast  Result Date: 05/12/2022 CLINICAL DATA:  Delirium. EXAM: CT HEAD WITHOUT CONTRAST TECHNIQUE: Contiguous axial images were obtained from the base of the skull through the vertex without intravenous contrast. RADIATION DOSE REDUCTION: This exam was performed according to the departmental dose-optimization program which includes automated exposure control, adjustment of the mA and/or kV according to patient size and/or use of iterative reconstruction technique. COMPARISON:  None Available. FINDINGS: Brain: No evidence of acute large vascular territory infarction, hemorrhage, hydrocephalus, extra-axial collection or mass lesion/mass effect. Advanced confluent patchy white matter hypoattenuation, nonspecific but compatible with chronic microvascular ischemic disease. Vascular: No hyperdense vessel identified. Calcific intracranial atherosclerosis. Skull: No acute fracture. Sinuses/Orbits: Clear sinuses.  No acute orbital findings. Other: No mastoid effusions. IMPRESSION: 1. No evidence of acute intracranial abnormality. 2. Advanced chronic microvascular ischemic disease, which could easily obscure a white matter infarct. If there is concern for acute infarct, recommend MRI for more sensitive evaluation. Electronically Signed   By: Margaretha Sheffield M.D.   On: 05/12/2022 11:08   CT ABDOMEN PELVIS W CONTRAST  Result Date: 05/12/2022 CLINICAL DATA:  Diarrhea. EXAM: CT ABDOMEN AND PELVIS WITH CONTRAST TECHNIQUE: Multidetector CT imaging of the abdomen and pelvis was performed using the standard protocol following bolus administration of intravenous contrast. RADIATION DOSE REDUCTION: This exam was performed according to the departmental dose-optimization program which includes automated exposure control, adjustment of the mA and/or kV according to patient size and/or use of iterative reconstruction technique. CONTRAST:  50m OMNIPAQUE IOHEXOL 300 MG/ML  SOLN COMPARISON:  None Available. FINDINGS: Lower chest:  No acute abnormality. Hepatobiliary: Focal area of low attenuation within the lateral segment of left hepatic lobe adjacent to the falciform ligament is favored to represent focal fatty deposition. No suspicious liver lesions identified. Multiple gallstones are noted measuring up to 9 mm. No gallbladder wall inflammation or signs of bile duct dilatation. Pancreas: Unremarkable. No pancreatic ductal dilatation or surrounding inflammatory changes. Spleen: Normal in size without focal abnormality. Adrenals/Urinary Tract: Normal appearance of the right adrenal gland. Left adrenal nodule measures 4.0 by 3.2 cm with Hounsfield units on portal venous phase imaging measuring 19.0, image 25/2. On the delayed phase images Hounsfield units equal 9.53. 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 No follow-up imaging is recommended. Small stone within the upper pole of the right kidney measures 2 mm, image 33/2. No left renal calculi. No signs of hydronephrosis. Small foci of gas noted within the dome of urinary bladder. Bladder is otherwise unremarkable. Stomach/Bowel: Moderate size hiatal hernia. The appendix is visualized and appears normal. No bowel wall thickening, inflammation, or distension. Reproductive: Uterus and bilateral adnexa are unremarkable. Other: No free fluid or fluid collections identified. 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 Diffuse osteopenia. 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 IMPRESSION: 1. No acute findings within the abdomen or pelvis. 2. Small foci of gas noted within the dome of urinary bladder. Correlate for any clinical signs or symptoms of cystitis. 3. Gallstones. 4. Moderate size hiatal hernia. Electronically Signed   By: TKerby MoorsM.D.   On: 05/12/2022 14:47   DG Chest Port 1 View  Result Date: 05/12/2022 CLINICAL DATA:  Questionable sepsis - evaluate for abnormality EXAM: PORTABLE CHEST 1 VIEW COMPARISON:  November 2019 FINDINGS: No new  consolidation or edema. No significant pleural effusion. No pneumothorax. Similar cardiomediastinal contours. Hiatal hernia. IMPRESSION: No acute process in the chest. Electronically Signed   By: PMacy MisM.D.   On: 05/12/2022 09:49    Disposition Plan & Communication  Patient status: Inpatient  Admitted From: Home Planned disposition location: TBD Anticipated discharge date: TBD pending clinical improvement and hgb stability  Family Communication: none    Author: CRicharda Osmond DO [facility] 05/13/2022, 7:51 AM   Available by Epic secure chat 7AM-7PM. If 7PM-7AM, please contact night-coverage.  [facility] contact information found on [email or web address]

## 2022-05-13 NOTE — Progress Notes (Signed)
Assumed care of pt at 1900. Pt daughter Melanie Bird at bedside and updated on POC w/ verbalized understanding. Pt is fatigue and oriented to person/situation. Daughter Melanie Bird reports patient has been having increasing auditory hallucinations at home at bedtime and requesting transition of care consult for potential home care.   Provider made aware of MAP of 61 at approx 2000. Fluids increased per MAR. Repeat H&H drawn and resulting at 6.4/22.3. Provider notified of critical result and 1 unit PRBCs ordered and infused per flowsheets.   Remains on enteric precautions as ordered, however, unable to obtain stool sample overnight. Purewick in place draining C/Y/U. Full assessment per MAR. Medication administration per MAR. Call bell within reach, needs being anticipated.

## 2022-05-13 NOTE — Telephone Encounter (Signed)
Pt daughter called stating pt is in the hospital and want to know if provider can do an assessment of pt just in case daughter has to take medical leave [phone]

## 2022-05-14 DIAGNOSIS — K529 Noninfective gastroenteritis and colitis, unspecified: Secondary | ICD-10-CM

## 2022-05-14 DIAGNOSIS — N390 Urinary tract infection, site not specified: Secondary | ICD-10-CM

## 2022-05-14 LAB — TYPE AND SCREEN
ABO/RH(D): O POS
Antibody Screen: NEGATIVE
Unit division: 0
Unit division: 0
Unit division: 0

## 2022-05-14 LAB — GASTROINTESTINAL PANEL BY PCR, STOOL (REPLACES STOOL CULTURE)

## 2022-05-14 LAB — BASIC METABOLIC PANEL
Anion gap: 8 (ref 5–15)
BUN: 13 mg/dL (ref 8–23)
CO2: 23 mmol/L (ref 22–32)
Calcium: 8.5 mg/dL — ABNORMAL LOW (ref 8.9–10.3)
Chloride: 109 mmol/L (ref 98–111)
Creatinine, Ser: 0.82 mg/dL (ref 0.44–1.00)
GFR, Estimated: >60 mL/min (ref >60)
Glucose, Bld: 167 mg/dL — ABNORMAL HIGH (ref 70–99)
Potassium: 3.7 mmol/L (ref 3.5–5.1)
Sodium: 140 mmol/L (ref 135–145)

## 2022-05-14 LAB — GLUCOSE, CAPILLARY
Glucose-Capillary: 138 mg/dL — ABNORMAL HIGH (ref 70–99)
Glucose-Capillary: 147 mg/dL — ABNORMAL HIGH (ref 70–99)
Glucose-Capillary: 231 mg/dL — ABNORMAL HIGH (ref 70–99)

## 2022-05-14 LAB — BPAM RBC
Blood Product Expiration Date: 202306182359
Blood Product Expiration Date: 202306242359
Blood Product Expiration Date: 202306242359
ISSUE DATE / TIME: 202305251130
ISSUE DATE / TIME: 202305251439
ISSUE DATE / TIME: 202305260027
Unit Type and Rh: 5100
Unit Type and Rh: 5100
Unit Type and Rh: 5100

## 2022-05-14 LAB — CBC
HCT: 28.6 % — ABNORMAL LOW (ref 36.0–46.0)
Hemoglobin: 8.4 g/dL — ABNORMAL LOW (ref 12.0–15.0)
MCH: 22 pg — ABNORMAL LOW (ref 26.0–34.0)
MCHC: 29.4 g/dL — ABNORMAL LOW (ref 30.0–36.0)
MCV: 74.9 fL — ABNORMAL LOW (ref 80.0–100.0)
Platelets: 375 10*3/uL (ref 150–400)
RBC: 3.82 MIL/uL — ABNORMAL LOW (ref 3.87–5.11)
RDW: 23.5 % — ABNORMAL HIGH (ref 11.5–15.5)
WBC: 9.8 10*3/uL (ref 4.0–10.5)
nRBC: 0.4 % — ABNORMAL HIGH (ref 0.0–0.2)

## 2022-05-14 MED ORDER — HALOPERIDOL LACTATE 5 MG/ML IJ SOLN
1.0000 mg | Freq: Once | INTRAMUSCULAR | Status: AC
  Administered 2022-05-14: 1 mg via INTRAVENOUS
  Filled 2022-05-14: qty 1

## 2022-05-14 MED ORDER — FUROSEMIDE 10 MG/ML IJ SOLN
20.0000 mg | INTRAMUSCULAR | Status: AC
  Administered 2022-05-14: 20 mg via INTRAVENOUS
  Filled 2022-05-14: qty 2

## 2022-05-14 MED ORDER — METOPROLOL TARTRATE 5 MG/5ML IV SOLN
5.0000 mg | Freq: Two times a day (BID) | INTRAVENOUS | Status: DC
  Administered 2022-05-14 – 2022-05-18 (×9): 5 mg via INTRAVENOUS
  Filled 2022-05-14 (×9): qty 5

## 2022-05-14 NOTE — Progress Notes (Signed)
PROGRESS NOTE  Melanie Bird    DOB: January 06, 1941, 81 y.o.  IRJ:[id number]    Code Status: Full Code   DOA: 05/12/2022   LOS: 2   Brief hospital course  Melanie Bird is a 81 y.o. Bird with a PMH significant for GERD, hypertension, arthritis and iron deficiency anemia requiring iron infusions.  They presented from home to the ED on 05/12/2022 with weakness with N/V and bowel incontinence of loose stool x about 3 days. She denies seeing blood in stool or emesis.  In the ED, it was found that they had stable vital signs with exception of hypotension as low as 115/33 (which appears to be chronically low diastolics but not this severe).  Significant findings included: WBC 10.0, hgb 3.6, hematocrit 15.8, MCV 66.9, RDW 21.5, platelet count 589, lactic acid 5.4, sodium 139, potassium 4.1, chloride 109, bicarb 19, glucose 235, BUN 35, creatinine 1.23 compared to baseline of 0.99, calcium 8.2, total protein 6.1, albumin 3.1, AST 26, ALT 13 Urine analysis shows pyuria. Chest x-ray negative acute disease CT head without contrast shows no evidence of acute intracranial abnormality. Advanced chronic microvascular ischemic disease, which could easily obscure a white matter infarct. CT abdomen negative for acute findings. Twelve-lead EKG shows sinus rhythm with low voltage precordial leads  They were initially treated with IV PPI, IV fluid hydration, CTX for suspected UTI. She received 3 units pRBCs overnight. Hgb 3.6>8.4  GI was consulted for evaluation.  Patient was admitted to medicine service for further workup and management of symptomatic anemia as outlined in detail below.  05/14/22 -stable  Assessment & Plan  Principal Problem:   Symptomatic anemia Active Problems:   Gastroenteritis   UTI (urinary tract infection)   Obesity   Lactic acidosis   HTN (hypertension)   Diabetes mellitus without complication (HCC)   Dehydration  Symptomatic anemia  presumed GI bleed- hgb 3.6>8.4 s/p 3  units pRBCs. No reported blood seen in stool or emesis prior to admission. Evaluated by GI and patient and daughter declined endoscopic evaluation at this time. Thought to be related to NSAID use vs malignancy vs diverticular. Had one formed stool without blood since admission.  - GI s/o - continue Protonix 40 mg IV BID - CBC am - continue IV fluids for a few more hours and then cut off as patient tolerates more PO - anti-emetics PRN - PT/OT tomorrow   UTI- patient denied urinary symptoms. Received CTX in ED - discontinue Abx at this time   Obesity BMI 30.16 kg/m2 Complicates overall prognosis and care Lifestyle modification and exercise has been discussed with patient in detail   Lactic acidosis- resolved. 2.7>1,4 Most likely secondary to diarrhea as well as metformin use No evidence of sepsis at this time   Diabetes mellitus without complication (HCC) - Glycemic control sliding scale insulin   HTN- poorly controlled.  - restart home amlodipine and metoprolol  Body mass index is 33.89 kg/m.  VTE ppx: SCDs Start: 05/12/22 1335  Diet:     Diet   Diet Carb Modified Fluid consistency: Thin; Room service appropriate? Yes   Consultants: GI Subjective 05/14/22    Pt sleeping.   Objective   Vitals:   05/13/22 1622 05/13/22 2045 05/13/22 2310 05/14/22 0421  BP: 104/67 (!) 155/71 140/65 133/82  Pulse: 72 99 94 93  Resp: '18 18 20 18  '$ Temp: 97.7 F (36.5 C) 97.7 F (36.5 C) 97.7 F (36.5 C) 98.3 F (36.8 C)  TempSrc: Oral Oral  Oral Oral  SpO2: 95% 97% 94% 94%  Weight:      Height:        Intake/Output Summary (Last 24 hours) at 05/14/2022 0755 Last data filed at 05/14/2022 0439 Gross per 24 hour  Intake 120 ml  Output 1000 ml  Net -880 ml    Filed Weights   05/12/22 0908 05/12/22 1539  Weight: 86.2 kg 76.1 kg     Physical Exam:  General: appears comfortable HEENT: atraumatic, clear conjunctiva, anicteric sclera, MMM,  Respiratory: normal respiratory  effort. Cardiovascular: quick capillary refill Nervous: asleep Skin: dry, intact, normal temperature, normal color. No rashes, lesions or ulcers on exposed skin  Labs   I have personally reviewed the following labs and imaging studies CBC    Component Value Date/Time   WBC 9.8 05/14/2022 0343   RBC 3.82 (L) 05/14/2022 0343   HGB 8.4 (L) 05/14/2022 0343   HGB 14.7 09/29/2014 1526   HGB 10.6 (L) 01/12/2012 1155   HCT 28.6 (L) 05/14/2022 0343   HCT 29.1 (L) 11/10/2015 1227   HCT 33.7 (L) 01/12/2012 1155   PLT 375 05/14/2022 0343   PLT 250 09/29/2014 1526   PLT 485 (H) 01/12/2012 1155   MCV 74.9 (L) 05/14/2022 0343   MCV 93 09/29/2014 1526   MCV 90.1 01/12/2012 1155   MCH 22.0 (L) 05/14/2022 0343   MCHC 29.4 (L) 05/14/2022 0343   RDW 23.5 (H) 05/14/2022 0343   RDW 14.3 09/29/2014 1526   RDW 15.3 (H) 01/12/2012 1155   LYMPHSABS 1.3 05/12/2022 0907   LYMPHSABS 1.8 09/29/2014 1526   LYMPHSABS 1.1 01/12/2012 1155   MONOABS 0.8 05/12/2022 0907   MONOABS 0.7 09/29/2014 1526   MONOABS 0.5 01/12/2012 1155   EOSABS 0.0 05/12/2022 0907   EOSABS 0.2 09/29/2014 1526   EOSABS 0.2 08/24/2010 1028   BASOSABS 0.0 05/12/2022 0907   BASOSABS 0.2 (H) 09/29/2014 1526   BASOSABS 0.0 01/12/2012 1155      Latest Ref Rng & Units 05/14/2022    3:43 AM 05/13/2022    7:50 AM 05/12/2022    9:53 PM  BMP  Glucose 70 - 99 mg/dL 167   137   129    BUN 8 - 23 mg/dL '13   15   24    '$ Creatinine 0.44 - 1.00 mg/dL 0.82   0.88   0.88    Sodium 135 - 145 mmol/L 140   139   142    Potassium 3.5 - 5.1 mmol/L 3.7   3.8   4.2    Chloride 98 - 111 mmol/L 109   110   113    CO2 22 - 32 mmol/L '23   22   22    '$ Calcium 8.9 - 10.3 mg/dL 8.5   8.2   8.0      CT Head Wo Contrast  Result Date: 05/12/2022 CLINICAL DATA:  Delirium. EXAM: CT HEAD WITHOUT CONTRAST TECHNIQUE: Contiguous axial images were obtained from the base of the skull through the vertex without intravenous contrast. RADIATION DOSE REDUCTION: This  exam was performed according to the departmental dose-optimization program which includes automated exposure control, adjustment of the mA and/or kV according to patient size and/or use of iterative reconstruction technique. COMPARISON:  None Available. FINDINGS: Brain: No evidence of acute large vascular territory infarction, hemorrhage, hydrocephalus, extra-axial collection or mass lesion/mass effect. Advanced confluent patchy white matter hypoattenuation, nonspecific but compatible with chronic microvascular ischemic disease. Vascular: No hyperdense vessel identified. Calcific intracranial  atherosclerosis. Skull: No acute fracture. Sinuses/Orbits: Clear sinuses.  No acute orbital findings. Other: No mastoid effusions. IMPRESSION: 1. No evidence of acute intracranial abnormality. 2. Advanced chronic microvascular ischemic disease, which could easily obscure a white matter infarct. If there is concern for acute infarct, recommend MRI for more sensitive evaluation. Electronically Signed   By: Margaretha Sheffield M.D.   On: 05/12/2022 11:08   CT ABDOMEN PELVIS W CONTRAST  Result Date: 05/12/2022 CLINICAL DATA:  Diarrhea. EXAM: CT ABDOMEN AND PELVIS WITH CONTRAST TECHNIQUE: Multidetector CT imaging of the abdomen and pelvis was performed using the standard protocol following bolus administration of intravenous contrast. RADIATION DOSE REDUCTION: This exam was performed according to the departmental dose-optimization program which includes automated exposure control, adjustment of the mA and/or kV according to patient size and/or use of iterative reconstruction technique. CONTRAST:  77m OMNIPAQUE IOHEXOL 300 MG/ML  SOLN COMPARISON:  None Available. FINDINGS: Lower chest: No acute abnormality. Hepatobiliary: Focal area of low attenuation within the lateral segment of left hepatic lobe adjacent to the falciform ligament is favored to represent focal fatty deposition. No suspicious liver lesions identified. Multiple  gallstones are noted measuring up to 9 mm. No gallbladder wall inflammation or signs of bile duct dilatation. Pancreas: Unremarkable. No pancreatic ductal dilatation or surrounding inflammatory changes. Spleen: Normal in size without focal abnormality. Adrenals/Urinary Tract: Normal appearance of the right adrenal gland. Left adrenal nodule measures 4.0 by 3.2 cm with Hounsfield units on portal venous phase imaging measuring 19.0, image 25/2. On the delayed phase images Hounsfield units equal 9.53. 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 No follow-up imaging is recommended. Small stone within the upper pole of the right kidney measures 2 mm, image 33/2. No left renal calculi. No signs of hydronephrosis. Small foci of gas noted within the dome of urinary bladder. Bladder is otherwise unremarkable. Stomach/Bowel: Moderate size hiatal hernia. The appendix is visualized and appears normal. No bowel wall thickening, inflammation, or distension. Reproductive: Uterus and bilateral adnexa are unremarkable. Other: No free fluid or fluid collections identified. 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 Diffuse osteopenia. 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 IMPRESSION: 1. No acute findings within the abdomen or pelvis. 2. Small foci of gas noted within the dome of urinary bladder. Correlate for any clinical signs or symptoms of cystitis. 3. Gallstones. 4. Moderate size hiatal hernia. Electronically Signed   By: TKerby MoorsM.D.   On: 05/12/2022 14:47   DG Chest Port 1 View  Result Date: 05/12/2022 CLINICAL DATA:  Questionable sepsis - evaluate for abnormality EXAM: PORTABLE CHEST 1 VIEW COMPARISON:  November 2019 FINDINGS: No new consolidation or edema. No significant pleural effusion. No pneumothorax. Similar cardiomediastinal contours. Hiatal hernia. IMPRESSION: No acute process in the chest. Electronically Signed   By: PMacy MisM.D.   On: 05/12/2022 09:49    Disposition  Plan & Communication  Patient status: Inpatient  Admitted From: Home Planned disposition location: TBD Anticipated discharge date: TBD pending clinical improvement and hgb stability  Family Communication: daughter on phone   Author: CRicharda Osmond DO [facility] 05/14/2022, 7:55 AM   Available by Epic secure chat 7AM-7PM. If 7PM-7AM, please contact night-coverage.  [facility] contact information found on [email or web address]

## 2022-05-14 NOTE — TOC Progression Note (Signed)
Transition of Care [facility]) - Progression Note    Patient Details  Name: Melanie Bird MRN: [id number] Date of Birth: [birth year]  Transition of Care [facility]) CM/SW Contact  Izola Price, RN Phone Number: 05/14/2022, 2:49 PM  Clinical Narrative:  5/27: TOC consult/following. Therapies unable to complete evaluations due to patient being combative. Currently has telesitter per rounds report. TOC will follow for discharge planning/evaluations complete. Simmie Davies RN CM  250 pm.          Expected Discharge Plan and Services                                                 Social Determinants of Health (SDOH) Interventions    Readmission Risk Interventions     View : No data to display.

## 2022-05-14 NOTE — Progress Notes (Signed)
PT Cancellation Note  Patient Details Name: Melanie Bird MRN: [id number] DOB: 1941/08/15   Cancelled Treatment:    Reason Eval/Treat Not Completed: Other (comment) (RN reports pt is combative and not appropriate to see) Hold PT services this date. Will attempt to see pt at a future date/time when appropriate.   Bradly Chris PT, DPT  05/14/2022, 2:39 PM

## 2022-05-14 NOTE — Progress Notes (Signed)
OT Cancellation Note  Patient Details Name: Melanie Bird MRN: [id number] DOB: Dec 08, 1941   Cancelled Treatment:    Reason Eval/Treat Not Completed: Other (comment) (per RN pt is combative, not appropriate to participate in therapy at this time. OT will re attempt as able.Shanon Payor, OTD OTR/L  05/14/22, 9:09 AM

## 2022-05-14 NOTE — Progress Notes (Signed)
Assumed care of pt at 1900. Alert to self only. Attempting to get OOB throughout the night, pulling off tele, nasal cannula and taking out Ivs. Mitts tried, however, pt removing those as well. MD aware. See new orders. Tele sitter in room at this time. Fall precautions in place.   At 0200, pt still pulling at lines, however, becoming combative towards staff attempting to bite, scratch, and hit. Provider made aware. See new orders.  Frequently incontinent overnight. Multiple linen changes. Stool sample sent, GI panel resulting negative, precautions dc'd per MD. MD also made aware of black stools. Purewick in place at this time. Full assessment per flowsheets. Medication administration per MAR.

## 2022-05-14 NOTE — Progress Notes (Signed)
OT Cancellation Note  Patient Details Name: Melanie Bird MRN: [id number] DOB: 1941/03/24   Cancelled Treatment:    Reason Eval/Treat Not Completed: Other (comment) (attempted OT eval again on this date, RN reports pt combative when pt wakes up, requests therapist to defer at this time. OT will reattempt as able)  Shanon Payor, OTD OTR/L  05/14/22, 2:06 PM

## 2022-05-15 LAB — CBC
HCT: 29.4 % — ABNORMAL LOW (ref 36.0–46.0)
Hemoglobin: 8.2 g/dL — ABNORMAL LOW (ref 12.0–15.0)
MCH: 21.5 pg — ABNORMAL LOW (ref 26.0–34.0)
MCHC: 27.9 g/dL — ABNORMAL LOW (ref 30.0–36.0)
MCV: 77.2 fL — ABNORMAL LOW (ref 80.0–100.0)
Platelets: 360 10*3/uL (ref 150–400)
RBC: 3.81 MIL/uL — ABNORMAL LOW (ref 3.87–5.11)
RDW: 24.7 % — ABNORMAL HIGH (ref 11.5–15.5)
WBC: 9 10*3/uL (ref 4.0–10.5)
nRBC: 0.3 % — ABNORMAL HIGH (ref 0.0–0.2)

## 2022-05-15 LAB — GLUCOSE, CAPILLARY
Glucose-Capillary: 130 mg/dL — ABNORMAL HIGH (ref 70–99)
Glucose-Capillary: 207 mg/dL — ABNORMAL HIGH (ref 70–99)
Glucose-Capillary: 143 mg/dL — ABNORMAL HIGH (ref 70–99)

## 2022-05-15 NOTE — Evaluation (Signed)
Occupational Therapy Evaluation Patient Details Name: Melanie Bird MRN: [id number] DOB: 06/28/1941 Today's Date: 05/15/2022   History of Present Illness Pt is an 81 y/o F admitted on 05/12/22 after presenting with c/o weakness, N&V, & bowel incontinence x 3 days. PMH: GERD, HTN, arthritis, iron deficiency anemia requiring infusions   Clinical Impression   Patient presenting with decreased Ind in self care, balance, functional mobility/transfers, endurance, and safety awareness. Patient reports being mod I at baseline with use of RW for ambulation and self care tasks. She lives with daughter who assists her with IADLs per pt report. No family present in room to confirm baseline. Pt needing min - mod A for functional mobility and toileting needs. Unsure if daughter is available 24/7 and able to provide physical assistance to pt.  Patient will benefit from acute OT to increase overall independence in the areas of ADLs, functional mobility,and safety awareness  in order to safely discharge to next venue of care.      Recommendations for follow up therapy are one component of a multi-disciplinary discharge planning process, led by the attending physician.  Recommendations may be updated based on patient status, additional functional criteria and insurance authorization.   Follow Up Recommendations  Skilled nursing-short term rehab (<3 hours/day)    Assistance Recommended at Discharge Frequent or constant Supervision/Assistance  Patient can return home with the following A lot of help with walking and/or transfers;A lot of help with bathing/dressing/bathroom;Direct supervision/assist for medications management;Help with stairs or ramp for entrance;Assist for transportation;Assistance with cooking/housework;Direct supervision/assist for financial management    Functional Status Assessment  Patient has had a recent decline in their functional status and demonstrates the ability to make significant  improvements in function in a reasonable and predictable amount of time.  Equipment Recommendations  Other (comment) (defer to next venue of care)       Precautions / Restrictions Precautions Precautions: Fall Restrictions Weight Bearing Restrictions: No      Mobility Bed Mobility Overal bed mobility: Needs Assistance Bed Mobility: Supine to Sit, Sit to Supine     Supine to sit: Min assist Sit to supine: Min assist   General bed mobility comments: assistance for trunk support and cuing for technique and hand placement    Transfers Overall transfer level: Needs assistance Equipment used: Rolling walker (2 wheels) Transfers: Sit to/from Stand, Bed to chair/wheelchair/BSC Sit to Stand: Mod assist     Step pivot transfers: Mod assist, Min assist            Balance Overall balance assessment: Needs assistance Sitting-balance support: Feet supported Sitting balance-Leahy Scale: Good     Standing balance support: Reliant on assistive device for balance, During functional activity Standing balance-Leahy Scale: Fair                             ADL either performed or assessed with clinical judgement   ADL Overall ADL's : Needs assistance/impaired     Grooming: Wash/dry hands;Wash/dry face;Standing;Min guard                   Armed forces technical officer: Moderate assistance;Rolling walker (2 wheels);BSC/3in1   Toileting- Clothing Manipulation and Hygiene: Moderate assistance;Sit to/from stand       Functional mobility during ADLs: Min guard;Minimal assistance;Rolling walker (2 wheels)       Vision Patient Visual Report: No change from baseline  Pertinent Vitals/Pain Pain Assessment Pain Assessment: No/denies pain        Extremity/Trunk Assessment Upper Extremity Assessment Upper Extremity Assessment: Generalized weakness   Lower Extremity Assessment Lower Extremity Assessment: Generalized weakness       Communication  Communication 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 Pt is oriented to self, location, and month.                Home Living Family/patient expects to be discharged to:: Private residence Living Arrangements: Children Available Help at Discharge: Available PRN/intermittently Type of Home: House Home Access: Level entry     [place]: Two level;Able to live on main level with bedroom/bathroom     Bathroom Shower/Tub: Tub/shower unit         Home Equipment: Conservation officer, nature (2 wheels)          Prior Functioning/Environment Prior Level of Function : Independent/Modified Independent;Needs assist             Mobility Comments: Pt reports she was using RW for mobility prior to admission. Daughter reports she works 10 hr days M-Thursday & pt is home alone. Pt doesn't leave house much, hasnt even been to MD appointinment in 2 years because pt just "stopped going". ADLs Comments: Pt reports daughter performs IADLs and drives her to appointments but that she is independent in self care tasks.        OT Problem List: Decreased strength;Decreased cognition;Decreased activity tolerance;Decreased safety awareness;Impaired balance (sitting and/or standing);Decreased knowledge of use of DME or AE      OT Treatment/Interventions: Self-care/ADL training;Balance training;Therapeutic exercise;Therapeutic activities;Energy conservation;Cognitive remediation/compensation;DME and/or AE instruction;Manual therapy;Patient/family education    OT Goals(Current goals can be found in the care plan section) Acute Rehab OT Goals Patient Stated Goal: to get better and go  home OT Goal Formulation: With patient Time For Goal Achievement: 05/29/22 Potential to Achieve Goals: Fair ADL Goals Pt Will Perform Grooming: with supervision;standing Pt Will Perform Lower Body Dressing: with supervision;sit to/from stand Pt Will Transfer to Toilet: with supervision;ambulating Pt Will Perform Toileting - Clothing Manipulation and hygiene: with supervision;sit to/from stand  OT Frequency: Min 2X/week       AM-PAC OT "6 Clicks" Daily Activity     Outcome Measure Help from another person eating meals?: A Little Help from another person taking care of personal grooming?: A Little Help from another person toileting, which includes using toliet, bedpan, or urinal?: A Lot Help from another person bathing (including washing, rinsing, drying)?: A Lot Help from another person to put on and taking off regular upper body clothing?: A Little Help from another person to put on and taking off regular lower body clothing?: A Lot 6 Click Score: 15   End of Session Equipment Utilized During Treatment: Rolling walker (2 wheels) Nurse Communication: Mobility status  Activity Tolerance: Patient tolerated treatment well Patient left: in bed;with call bell/phone within reach;with bed alarm set  OT Visit Diagnosis: Unsteadiness on feet (R26.81);Repeated falls (R29.6);Muscle weakness (generalized) (M62.81)                Time: 5465-6812 OT Time Calculation (  min): 17 min Charges:  OT General Charges $OT Visit: 1 Visit OT Evaluation $OT Eval Moderate Complexity: 1 Mod OT Treatments $Self Care/Home Management : 8-22 mins  Darleen Crocker, MS, OTR/L , CBIS ascom [phone]  05/15/22, 12:01 PM

## 2022-05-15 NOTE — Evaluation (Signed)
Physical Therapy Evaluation Patient Details Name: Melanie Bird MRN: [id number] DOB: 1941-06-21 Today's Date: 05/15/2022  History of Present Illness  Pt is an 81 y/o F admitted on 05/12/22 after presenting with c/o weakness, N&V, & bowel incontinence x 3 days. PMH: GERD, HTN, arthritis, iron deficiency anemia requiring infusions  Clinical Impression  Pt seen for PT evaluation with pt agreeable to tx. Pt is AxO to self, year, and location but not situation. Pt reports she lives with her daughter & was using a RW prior to admission. Spoke with daughter Lanelle Bal) via telephone who reports information is accurate but pt is also home alone 4 days week as she works 10hrs days. On this date, pt is able to complete bed mobility with supervision but does require HOB elevated & use of bed rails to do so. Pt completes STS with RW & Min assist & ambulates into hallway with RW & min<>mod assist. Pt demonstrates impaired balance when turning & endorses fatigue, grateful to sit down once back to bed. Pt would benefit from STR upon d/c to maximize independence with functional mobility & reduce fall risk prior to return home.       Recommendations for follow up therapy are one component of a multi-disciplinary discharge planning process, led by the attending physician.  Recommendations may be updated based on patient status, additional functional criteria and insurance authorization.  Follow Up Recommendations Skilled nursing-short term rehab (<3 hours/day)    Assistance Recommended at Discharge Frequent or constant Supervision/Assistance  Patient can return home with the following  A lot of help with walking and/or transfers;A lot of help with bathing/dressing/bathroom;Assist for transportation;Assistance with cooking/housework;Direct supervision/assist for financial management;Direct supervision/assist for medications management;Help with stairs or ramp for entrance    Equipment Recommendations None recommended  by PT  Recommendations for Other Services       Functional Status Assessment Patient has had a recent decline in their functional status and demonstrates the ability to make significant improvements in function in a reasonable and predictable amount of time.     Precautions / Restrictions Precautions Precautions: Fall Restrictions Weight Bearing Restrictions: No      Mobility  Bed Mobility Overal bed mobility: Needs Assistance Bed Mobility: Supine to Sit, Sit to Supine     Supine to sit: Supervision, HOB elevated Sit to supine: Supervision, HOB elevated   General bed mobility comments: use of bed rails, extra time to complete movement    Transfers Overall transfer level: Needs assistance Equipment used: Rolling walker (2 wheels) Transfers: Sit to/from Stand Sit to Stand: Min assist           General transfer comment: PT attempts to facilitate proper hand placement during STS.    Ambulation/Gait Ambulation/Gait assistance: Min assist, Mod assist Gait Distance (Feet): 35 Feet Assistive device: Rolling walker (2 wheels) Gait Pattern/deviations: Decreased step length - right, Decreased step length - left, Decreased stride length, Decreased dorsiflexion - right, Decreased dorsiflexion - left Gait velocity: decreased     General Gait Details: pt requires min assist for gait for linear path, mod assist for turning 2/2 LOB with use of RW, extra time for obstacle avoidance/negotiating around obstacles in room  Stairs            Wheelchair Mobility    Modified Rankin (Stroke Patients Only)       Balance Overall balance assessment: Needs assistance Sitting-balance support: Feet supported Sitting balance-Leahy Scale: Fair Sitting balance - Comments: supervision static sitting   Standing balance  support: Reliant on assistive device for balance, During functional activity, Bilateral upper extremity supported Standing balance-Leahy Scale: Fair Standing balance  comment: BUE support on RW                             Pertinent Vitals/Pain Pain Assessment Pain Assessment: No/denies pain    Home Living Family/patient expects to be discharged to:: Private residence Living Arrangements: Children Available Help at Discharge: Available PRN/intermittently Type of Home: House Home Access: Level entry       Home Layout: Two level;Able to live on main level with bedroom/bathroom Home Equipment: Rolling Walker (2 wheels)      Prior Function Prior Level of Function : Independent/Modified Independent;Needs assist             Mobility Comments: Pt reports she was using RW for mobility prior to admission. Daughter reports she works 10 hr days M-Thursday & pt is home alone. Pt doesn't leave house much, hasnt even been to MD appointinment in 2 years because pt just "stopped going". ADLs Comments: Pt reports daughter performs IADLs and drives her to appointments but that she is independent in self care tasks.     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)  Cognition Arousal/Alertness: Awake/alert Behavior During Therapy: WFL for tasks assessed/performed Overall Cognitive Status: No family/caregiver present to determine baseline cognitive functioning Area of Impairment: Orientation, Memory, Attention, Safety/judgement, Awareness, Following commands, Problem solving                 Orientation Level: Disoriented to, Situation   Memory: Decreased short-term memory Following Commands: Follows one step commands consistently, Follows one step commands with increased time Safety/Judgement: Decreased awareness of safety, Decreased awareness of deficits Awareness: Emergent Problem Solving:  Slow processing, Requires verbal cues, Decreased initiation          General Comments General comments (skin integrity, edema, etc.): Pt received on 2L/min via nasal cannula, weaned to room air & maintained SPO2 >90% on room air during session, pt left on room air at end & nurse notified    Exercises     Assessment/Plan    PT Assessment Patient needs continued PT services  PT Problem List Decreased coordination;Decreased strength;Cardiopulmonary status limiting activity;Decreased cognition;Decreased knowledge of use of DME;Decreased activity tolerance;Decreased balance;Decreased safety awareness;Decreased mobility;Decreased knowledge of precautions       PT Treatment Interventions DME instruction;Therapeutic exercise;Gait training;Balance training;Stair training;Neuromuscular re-education;Functional mobility training;Cognitive remediation;Patient/family education;Therapeutic activities;Modalities    PT Goals (Current goals can be found in the Care Plan section)  Acute Rehab PT Goals Patient Stated Goal: get better PT Goal Formulation: With patient/family Time For Goal Achievement: 05/29/22 Potential to Achieve Goals: Good    Frequency Min 2X/week     Co-evaluation               AM-PAC PT "6 Clicks" Mobility  Outcome Measure Help needed turning from your back to your side while in a flat bed without using bedrails?: A Little Help needed moving from lying on your back to sitting on the side of a flat bed without using bedrails?: A Little Help needed moving to and from a bed to a chair (including a wheelchair)?: A Little Help  needed standing up from a chair using your arms (e.g., wheelchair or bedside chair)?: A Little Help needed to walk in hospital room?: A Lot Help needed climbing 3-5 steps with a railing? : A Lot 6 Click Score: 16    End of Session   Activity Tolerance: Patient tolerated treatment well Patient left: in bed;with call bell/phone within reach;with  bed alarm set (telesitter in room) Nurse Communication: Mobility status (weaned to room air) PT Visit Diagnosis: Unsteadiness on feet (R26.81);Muscle weakness (generalized) (M62.81);Difficulty in walking, not elsewhere classified (R26.2)    Time: 0254-2706 PT Time Calculation (min) (ACUTE ONLY): 16 min   Charges:   PT Evaluation $PT Eval Moderate Complexity: [place], PT, DPT 05/15/22, 12:06 PM   Waunita Schooner 05/15/2022, 12:04 PM

## 2022-05-15 NOTE — Progress Notes (Signed)
PROGRESS NOTE  Melanie Bird    DOB: 09/06/1941, 81 y.o.  UUE:[id number]    Code Status: Full Code   DOA: 05/12/2022   LOS: 3   Brief hospital course  Melanie Bird is a 81 y.o. female with a PMH significant for GERD, hypertension, arthritis and iron deficiency anemia requiring iron infusions.  They presented from home to the ED on 05/12/2022 with weakness with N/V and bowel incontinence of loose stool x about 3 days. She denies seeing blood in stool or emesis.  In the ED, it was found that they had stable vital signs with exception of hypotension as low as 115/33 (which appears to be chronically low diastolics but not this severe).  Significant findings included: WBC 10.0, hgb 3.6, hematocrit 15.8, MCV 66.9, RDW 21.5, platelet count 589, lactic acid 5.4, sodium 139, potassium 4.1, chloride 109, bicarb 19, glucose 235, BUN 35, creatinine 1.23 compared to baseline of 0.99, calcium 8.2, total protein 6.1, albumin 3.1, AST 26, ALT 13 Urine analysis shows pyuria. Chest x-ray negative acute disease CT head without contrast shows no evidence of acute intracranial abnormality. Advanced chronic microvascular ischemic disease, which could easily obscure a white matter infarct. CT abdomen negative for acute findings. Twelve-lead EKG shows sinus rhythm with low voltage precordial leads  They were initially treated with IV PPI, IV fluid hydration, CTX for suspected UTI. She received 3 units pRBCs overnight. Hgb 3.6>8.4  GI was consulted for evaluation.  Patient was admitted to medicine service for further workup and management of symptomatic anemia as outlined in detail below.  05/15/22 -stable  Assessment & Plan  Principal Problem:   Symptomatic anemia Active Problems:   Gastroenteritis   UTI (urinary tract infection)   Obesity   Lactic acidosis   HTN (hypertension)   Diabetes mellitus without complication (HCC)   Dehydration   Urinary tract infection, acute  Symptomatic anemia   presumed GI bleed- hgb 3.6>8.4>8.4 s/p 3 units pRBCs. No reported blood seen in stool or emesis prior to admission. Evaluated by GI and patient and daughter declined endoscopic evaluation at this time. Thought to be related to NSAID use vs malignancy vs diverticular. Multiple stools.  - GI s/o - transition PPI to oral - CBC am - anti-emetics PRN - PT/OT - TOC consulted for SNF placement  Acute delirium on chronic dementia- daughter confirms that family has been concerned about dementia for some time and has been gradually worsening. Patient had overnight event 5/26 treated with haldol and has been delirious or sedated since. Tele sitter in room. Patient is alert and oriented to self and place today.  - continue sitter - avoid sedating medications - OOB during day - minimize cords, interventions at night time.  - windows open during the day and dark/quiet room at night  Hypoxia- potentially related to anemia - wean O2 as tolerated   UTI- patient denied urinary symptoms. Received CTX in ED - discontinue Abx at this time   Obesity BMI 91.50 kg/m2 Complicates overall prognosis and care Lifestyle modification and exercise has been discussed with patient in detail   Lactic acidosis- resolved. 2.7>1,4 Most likely secondary to diarrhea as well as metformin use No evidence of sepsis at this time   Diabetes mellitus without complication (HCC) - Glycemic control sliding scale insulin   HTN- improving with restarting home medications - continue home amlodipine and metoprolol  Body mass index is 33.89 kg/m.  VTE ppx: SCDs Start: 05/12/22 1335  Diet:  Diet   Diet Carb Modified Fluid consistency: Thin; Room service appropriate? Yes   Consultants: GI Subjective 05/15/22    Pt reports feeling improved today. Reports that she was confused the last couple days. Denies any current concerns. Wants to eat breakfast.    Objective   Vitals:   05/14/22 1641 05/14/22 1958 05/14/22  2306 05/15/22 0521  BP: 111/71 (!) 121/55 (!) 143/61 133/66  Pulse: (!) 101 93 78 87  Resp: '18 20 20 20  '$ Temp: 98.1 F (36.7 C) 98.3 F (36.8 C) 98 F (36.7 C) 98.5 F (36.9 C)  TempSrc:      SpO2: 98% 99% 98% 97%  Weight:      Height:        Intake/Output Summary (Last 24 hours) at 05/15/2022 0735 Last data filed at 05/14/2022 0800 Gross per 24 hour  Intake --  Output 400 ml  Net -400 ml    Filed Weights   05/12/22 0908 05/12/22 1539  Weight: 86.2 kg 76.1 kg     Physical Exam:  General: awake, alert, NAD HEENT: atraumatic, clear conjunctiva, anicteric sclera, MMM, hard of hearing Respiratory: normal respiratory effort. Cardiovascular: quick capillary refill Nervous: A&O x2. no gross focal neurologic deficits, normal speech Extremities: moves all equally, trace LE edema, normal tone Skin: dry, intact, normal temperature, normal color. No rashes, lesions or ulcers on exposed skin  Labs   I have personally reviewed the following labs and imaging studies CBC    Component Value Date/Time   WBC 9.0 05/15/2022 0354   RBC 3.81 (L) 05/15/2022 0354   HGB 8.2 (L) 05/15/2022 0354   HGB 14.7 09/29/2014 1526   HGB 10.6 (L) 01/12/2012 1155   HCT 29.4 (L) 05/15/2022 0354   HCT 29.1 (L) 11/10/2015 1227   HCT 33.7 (L) 01/12/2012 1155   PLT 360 05/15/2022 0354   PLT 250 09/29/2014 1526   PLT 485 (H) 01/12/2012 1155   MCV 77.2 (L) 05/15/2022 0354   MCV 93 09/29/2014 1526   MCV 90.1 01/12/2012 1155   MCH 21.5 (L) 05/15/2022 0354   MCHC 27.9 (L) 05/15/2022 0354   RDW 24.7 (H) 05/15/2022 0354   RDW 14.3 09/29/2014 1526   RDW 15.3 (H) 01/12/2012 1155   LYMPHSABS 1.3 05/12/2022 0907   LYMPHSABS 1.8 09/29/2014 1526   LYMPHSABS 1.1 01/12/2012 1155   MONOABS 0.8 05/12/2022 0907   MONOABS 0.7 09/29/2014 1526   MONOABS 0.5 01/12/2012 1155   EOSABS 0.0 05/12/2022 0907   EOSABS 0.2 09/29/2014 1526   EOSABS 0.2 08/24/2010 1028   BASOSABS 0.0 05/12/2022 0907   BASOSABS 0.2 (H)  09/29/2014 1526   BASOSABS 0.0 01/12/2012 1155      Latest Ref Rng & Units 05/14/2022    3:43 AM 05/13/2022    7:50 AM 05/12/2022    9:53 PM  BMP  Glucose 70 - 99 mg/dL 167   137   129    BUN 8 - 23 mg/dL '13   15   24    '$ Creatinine 0.44 - 1.00 mg/dL 0.82   0.88   0.88    Sodium 135 - 145 mmol/L 140   139   142    Potassium 3.5 - 5.1 mmol/L 3.7   3.8   4.2    Chloride 98 - 111 mmol/L 109   110   113    CO2 22 - 32 mmol/L '23   22   22    '$ Calcium 8.9 - 10.3 mg/dL 8.5  8.2   8.0      No results found.  Disposition Plan & Communication  Patient status: Inpatient  Admitted From: Home Planned disposition location: TBD, likely SNF Anticipated discharge date: TBD pending clinical improvement and hgb stability and PT/OT evaluation  Family Communication: daughter on phone   Author: Richarda Osmond, DO [facility] 05/15/2022, 7:35 AM   Available by Epic secure chat 7AM-7PM. If 7PM-7AM, please contact night-coverage.  [facility] contact information found on [email or web address].

## 2022-05-16 LAB — COMPREHENSIVE METABOLIC PANEL
ALT: 12 U/L (ref 0–44)
AST: 14 U/L — ABNORMAL LOW (ref 15–41)
Albumin: 2.9 g/dL — ABNORMAL LOW (ref 3.5–5.0)
Alkaline Phosphatase: 60 U/L (ref 38–126)
Anion gap: 8 (ref 5–15)
BUN: 20 mg/dL (ref 8–23)
CO2: 25 mmol/L (ref 22–32)
Calcium: 8.2 mg/dL — ABNORMAL LOW (ref 8.9–10.3)
Chloride: 106 mmol/L (ref 98–111)
Creatinine, Ser: 0.85 mg/dL (ref 0.44–1.00)
GFR, Estimated: >60 mL/min (ref >60)
Glucose, Bld: 135 mg/dL — ABNORMAL HIGH (ref 70–99)
Potassium: 3.5 mmol/L (ref 3.5–5.1)
Sodium: 139 mmol/L (ref 135–145)
Total Bilirubin: 0.6 mg/dL (ref 0.3–1.2)
Total Protein: 6.1 g/dL — ABNORMAL LOW (ref 6.5–8.1)

## 2022-05-16 LAB — CBC
HCT: 30.9 % — ABNORMAL LOW (ref 36.0–46.0)
Hemoglobin: 8.6 g/dL — ABNORMAL LOW (ref 12.0–15.0)
MCH: 21.7 pg — ABNORMAL LOW (ref 26.0–34.0)
MCHC: 27.8 g/dL — ABNORMAL LOW (ref 30.0–36.0)
MCV: 78 fL — ABNORMAL LOW (ref 80.0–100.0)
Platelets: 357 10*3/uL (ref 150–400)
RBC: 3.96 MIL/uL (ref 3.87–5.11)
RDW: 25.9 % — ABNORMAL HIGH (ref 11.5–15.5)
WBC: 9.1 10*3/uL (ref 4.0–10.5)
nRBC: 0 % (ref 0.0–0.2)

## 2022-05-16 LAB — GLUCOSE, CAPILLARY
Glucose-Capillary: 155 mg/dL — ABNORMAL HIGH (ref 70–99)
Glucose-Capillary: 194 mg/dL — ABNORMAL HIGH (ref 70–99)
Glucose-Capillary: 177 mg/dL — ABNORMAL HIGH (ref 70–99)

## 2022-05-16 NOTE — Care Management Important Message (Signed)
Important Message  Patient Details  Name: Melanie Bird MRN: [id number] Date of Birth: 1941-03-12   Medicare Important Message Given:  Yes     Dannette Barbara 05/16/2022, 12:38 PM

## 2022-05-16 NOTE — NC FL2 (Signed)
[place] LEVEL OF CARE SCREENING TOOL     IDENTIFICATION  Patient Name: Melanie Bird Birthdate: 1941/04/11 Sex: female Admission Date (Current Location): 05/12/2022  [place] and [place] Number:  Engineering geologist and Address:  [facility], [address], [place], [place] [address]      Provider Number: [id number]  Attending Physician Name and Address:  Richarda Osmond, MD  Relative Name and Phone Number:  Lanelle Bal (daughter) [phone]    Current Level of Care: Hospital Recommended Level of Care: [place] Prior Approval Number:    Date Approved/Denied:   PASRR Number: [id number]  Discharge Plan: SNF    Current Diagnoses: Patient Active Problem List   Diagnosis Date Noted   Urinary tract infection, acute    Symptomatic anemia 05/12/2022   Gastroenteritis 05/12/2022   Lactic acidosis 05/12/2022   Dehydration 05/12/2022   Auditory hallucination 02/19/2020   Elevated WBC count 05/01/2019   Chronic pain of right knee 11/28/2018   Diabetes mellitus without complication ([place]) 02/54/2706   Wheezing 10/29/2018   Depression, major, single episode, in partial remission ([place]) 10/29/2018   Osteoporosis 08/24/2018   OSA (obstructive sleep apnea) 06/13/2018   Primary hyperparathyroidism ([place]) 06/13/2018   Anxiety 03/09/2018   UTI (urinary tract infection) 10/02/2017   Fall at home, subsequent encounter 10/02/2017   HTN (hypertension) 10/26/2015   Fatigue 10/26/2015   Vitamin D deficiency 10/26/2015   Obesity 02/13/2012   Iron deficiency anemia due to chronic blood loss 08/26/2010   GERD 08/26/2010    Orientation RESPIRATION BLADDER Height & Weight     Self, Place  O2 (2L nasal cannula) Incontinent, External catheter Weight: 167 lb 12.3 oz (76.1 kg) Height:  '4\' 11"'$  (149.9 cm)  BEHAVIORAL SYMPTOMS/MOOD NEUROLOGICAL BOWEL NUTRITION STATUS      Incontinent Diet (see dischare summary)  AMBULATORY STATUS  COMMUNICATION OF NEEDS Skin   Limited Assist Verbally Normal                       Personal Care Assistance Level of Assistance  Bathing, Feeding, Dressing, Total care Bathing Assistance: Limited assistance Feeding assistance: Independent Dressing Assistance: Limited assistance Total Care Assistance: Limited assistance   Functional Limitations Info  Sight, Hearing, Speech Sight Info: Impaired Hearing Info: Impaired Speech Info: Adequate    SPECIAL CARE FACTORS FREQUENCY  PT (By licensed PT), OT (By licensed OT)     PT Frequency: min 4x weekly OT Frequency: min 4x weekly            Contractures Contractures Info: Not present    Additional Factors Info  Code Status, Allergies Code Status Info: full Allergies Info: no known allergies           Current Medications (05/16/2022):  This is the current hospital active medication list Current Facility-Administered Medications  Medication Dose Route Frequency Provider Last Rate Last Admin   0.9 %  sodium chloride infusion  250 mL Intravenous PRN Agbata, Tochukwu, MD       acetaminophen (TYLENOL) tablet 650 mg  650 mg Oral Q6H PRN Agbata, Tochukwu, MD   650 mg at 05/13/22 1216   Or   acetaminophen (TYLENOL) suppository 650 mg  650 mg Rectal Q6H PRN Agbata, Tochukwu, MD       amLODipine (NORVASC) tablet 5 mg  5 mg Oral Daily Doristine Mango L, MD   5 mg at 05/15/22 0919   brinzolamide (AZOPT) 1 % ophthalmic suspension 1 drop  1 drop Both Eyes TID Agbata, Tochukwu, MD   1 drop at 05/15/22 2108   And   brimonidine (ALPHAGAN) 0.2 % ophthalmic solution 1 drop  1 drop Both Eyes TID Agbata, Tochukwu, MD   1 drop at 05/15/22 2108   insulin aspart (novoLOG) injection 0-15 Units  0-15 Units Subcutaneous TID WC Agbata, Tochukwu, MD   2 Units at 05/15/22 1703   latanoprost (XALATAN) 0.005 % ophthalmic solution 1 drop  1 drop Both Eyes QHS Agbata, Tochukwu, MD   1 drop at 05/15/22 2108   metoprolol tartrate (LOPRESSOR) injection 5  mg  5 mg Intravenous Q12H Doristine Mango L, MD   5 mg at 05/15/22 2107   ondansetron (ZOFRAN) tablet 4 mg  4 mg Oral Q6H PRN Agbata, Tochukwu, MD       Or   ondansetron (ZOFRAN) injection 4 mg  4 mg Intravenous Q6H PRN Agbata, Tochukwu, MD       pantoprazole (PROTONIX) injection 40 mg  40 mg Intravenous Q12H Lin Landsman, MD   40 mg at 05/15/22 2107   sodium chloride flush (NS) 0.9 % injection 3 mL  3 mL Intravenous Q12H Agbata, Tochukwu, MD   3 mL at 05/15/22 2108   sodium chloride flush (NS) 0.9 % injection 3 mL  3 mL Intravenous PRN Agbata, Tochukwu, MD       traZODone (DESYREL) tablet 100 mg  100 mg Oral QHS Agbata, Tochukwu, MD   100 mg at 05/15/22 2107     Discharge Medications: Please see discharge summary for a list of discharge medications.  Relevant Imaging Results:  Relevant Lab Results:   Additional Information [id number]  Alberteen Sam, LCSW

## 2022-05-16 NOTE — TOC Progression Note (Signed)
Transition of Care [facility]) - Progression Note    Patient Details  Name: Melanie Bird CROUCHER MRN: [id number] Date of Birth: Jul 28, 1941  [facility] CM/SW Contact  Laurena Slimmer, RN Phone Number: 05/16/2022, 12:44 PM  Clinical Narrative:    Damaris Schooner with patient's Daughter, Lanelle Bal regarding choice of SNF. Lanelle Bal stated she would have to speak with her brother and call back tomorrow with the facility of choice.        Expected Discharge Plan and Services                                                 Social Determinants of Health (SDOH) Interventions    Readmission Risk Interventions     View : No data to display.

## 2022-05-16 NOTE — Plan of Care (Signed)
  Problem: Education: Goal: Knowledge of General Education information will improve Description: Including pain rating scale, medication(s)/side effects and non-pharmacologic comfort measures Outcome: Progressing   Problem: Health Behavior/Discharge Planning: Goal: Ability to manage health-related needs will improve Outcome: Progressing   Problem: Clinical Measurements: Goal: Ability to maintain clinical measurements within normal limits will improve Outcome: Progressing Goal: Will remain free from infection Outcome: Progressing Goal: Diagnostic test results will improve Outcome: Progressing Goal: Respiratory complications will improve Outcome: Progressing Goal: Cardiovascular complication will be avoided Outcome: Progressing   Problem: Nutrition: Goal: Adequate nutrition will be maintained Outcome: Progressing   Problem: Activity: Goal: Risk for activity intolerance will decrease Outcome: Progressing

## 2022-05-16 NOTE — Progress Notes (Signed)
PROGRESS NOTE  Melanie Bird    DOB: 23-Aug-1941, 81 y.o.  YIR:[id number]    Code Status: Full Code   DOA: 05/12/2022   LOS: 4   Brief hospital course  Melanie Bird is a 81 y.o. female with a PMH significant for GERD, hypertension, arthritis and iron deficiency anemia requiring iron infusions.  They presented from home to the ED on 05/12/2022 with weakness with N/V and bowel incontinence of loose stool x about 3 days. She denies seeing blood in stool or emesis.  In the ED, it was found that they had stable vital signs with exception of hypotension as low as 115/33 (which appears to be chronically low diastolics but not this severe).  Significant findings included: WBC 10.0, hgb 3.6, hematocrit 15.8, MCV 66.9, RDW 21.5, platelet count 589, lactic acid 5.4, sodium 139, potassium 4.1, chloride 109, bicarb 19, glucose 235, BUN 35, creatinine 1.23 compared to baseline of 0.99, calcium 8.2, total protein 6.1, albumin 3.1, AST 26, ALT 13 Urine analysis shows pyuria. Chest x-ray negative acute disease CT head without contrast shows no evidence of acute intracranial abnormality. Advanced chronic microvascular ischemic disease, which could easily obscure a white matter infarct. CT abdomen negative for acute findings. Twelve-lead EKG shows sinus rhythm with low voltage precordial leads  They were initially treated with IV PPI, IV fluid hydration, CTX for suspected UTI. She received 3 units pRBCs overnight. Hgb 3.6>8.4  GI was consulted for evaluation.  Patient was admitted to medicine service for further workup and management of symptomatic anemia as outlined in detail below.  05/16/22 -stable  Assessment & Plan  Principal Problem:   Symptomatic anemia Active Problems:   Gastroenteritis   UTI (urinary tract infection)   Obesity   Lactic acidosis   HTN (hypertension)   Diabetes mellitus without complication (HCC)   Dehydration   Urinary tract infection, acute  Symptomatic anemia   presumed GI bleed- hgb 3.6>>>>8.6 s/p 3 units pRBCs. No reported blood seen in stool or emesis prior to admission. Evaluated by GI and patient and daughter declined endoscopic evaluation at this time. Thought to be related to NSAID use vs malignancy vs diverticular. Multiple stools.  - GI s/o - continue PPI - CBC am - anti-emetics PRN - Melanie Bird/OT - TOC consulted for SNF placement  Acute delirium on chronic dementia- daughter confirms that family has been concerned about dementia for some time and has been gradually worsening. Patient had overnight event 5/26 treated with haldol. Patient is oriented and conversational today.  - continue tele sitter - avoid sedating medications - OOB during day - minimize cords, interventions at night time.  - windows open during the day and dark/quiet room at night  Hypoxia- potentially related to anemia - wean O2 as tolerated. Please only place on oxygen if she has an oxygen requirement and not just for comfort as oxygen has consequences of misuse when not needed.    UTI- patient denied urinary symptoms. Received CTX in ED - discontinue Abx at this time   Obesity BMI 50.09 kg/m2 Complicates overall prognosis and care Lifestyle modification and exercise has been discussed with patient in detail   Lactic acidosis- resolved. 2.7>1.4 Most likely secondary to diarrhea as well as metformin use No evidence of sepsis at this time   Diabetes mellitus without complication (HCC) - Glycemic control sliding scale insulin   HTN- improving with restarting home medications - continue home amlodipine and metoprolol  Body mass index is 33.89 kg/m.  VTE ppx:  SCDs Start: 05/12/22 1335  Diet:     Diet   Diet Carb Modified Fluid consistency: Thin; Room service appropriate? Yes   Consultants: GI Subjective 05/16/22    Melanie Bird reports doing well today. She is agreeable to going to SNF. Denies any complaints today.   Objective   Vitals:   05/15/22 1600 05/15/22  1924 05/16/22 0004 05/16/22 0413  BP: (!) 148/76 121/62 (!) 134/57 (!) 120/43  Pulse: (!) 103 (!) 107 99 98  Resp: '18 20 20 18  '$ Temp: 98.3 F (36.8 C) 98.6 F (37 C) 98.4 F (36.9 C) 98.4 F (36.9 C)  TempSrc: Oral     SpO2: 93% 96% 94% 92%  Weight:      Height:        Intake/Output Summary (Last 24 hours) at 05/16/2022 0733 Last data filed at 05/15/2022 1349 Gross per 24 hour  Intake 480 ml  Output --  Net 480 ml    Filed Weights   05/12/22 0908 05/12/22 1539  Weight: 86.2 kg 76.1 kg     Physical Exam:  General: awake, alert, NAD HEENT: atraumatic, clear conjunctiva, anicteric sclera, MMM, hard of hearing Respiratory: normal respiratory effort. Cardiovascular: quick capillary refill Nervous: A&O x3. no gross focal neurologic deficits, normal speech Extremities: moves all equally, trace LE edema, normal tone Skin: dry, intact, normal temperature, normal color. No rashes, lesions or ulcers on exposed skin  Labs   I have personally reviewed the following labs and imaging studies CBC    Component Value Date/Time   WBC 9.1 05/16/2022 0430   RBC 3.96 05/16/2022 0430   HGB 8.6 (L) 05/16/2022 0430   HGB 14.7 09/29/2014 1526   HGB 10.6 (L) 01/12/2012 1155   HCT 30.9 (L) 05/16/2022 0430   HCT 29.1 (L) 11/10/2015 1227   HCT 33.7 (L) 01/12/2012 1155   PLT 357 05/16/2022 0430   PLT 250 09/29/2014 1526   PLT 485 (H) 01/12/2012 1155   MCV 78.0 (L) 05/16/2022 0430   MCV 93 09/29/2014 1526   MCV 90.1 01/12/2012 1155   MCH 21.7 (L) 05/16/2022 0430   MCHC 27.8 (L) 05/16/2022 0430   RDW 25.9 (H) 05/16/2022 0430   RDW 14.3 09/29/2014 1526   RDW 15.3 (H) 01/12/2012 1155   LYMPHSABS 1.3 05/12/2022 0907   LYMPHSABS 1.8 09/29/2014 1526   LYMPHSABS 1.1 01/12/2012 1155   MONOABS 0.8 05/12/2022 0907   MONOABS 0.7 09/29/2014 1526   MONOABS 0.5 01/12/2012 1155   EOSABS 0.0 05/12/2022 0907   EOSABS 0.2 09/29/2014 1526   EOSABS 0.2 08/24/2010 1028   BASOSABS 0.0 05/12/2022 0907    BASOSABS 0.2 (H) 09/29/2014 1526   BASOSABS 0.0 01/12/2012 1155      Latest Ref Rng & Units 05/16/2022    4:30 AM 05/14/2022    3:43 AM 05/13/2022    7:50 AM  BMP  Glucose 70 - 99 mg/dL 135   167   137    BUN 8 - 23 mg/dL '20   13   15    '$ Creatinine 0.44 - 1.00 mg/dL 0.85   0.82   0.88    Sodium 135 - 145 mmol/L 139   140   139    Potassium 3.5 - 5.1 mmol/L 3.5   3.7   3.8    Chloride 98 - 111 mmol/L 106   109   110    CO2 22 - 32 mmol/L '25   23   22    '$ Calcium 8.9 -  10.3 mg/dL 8.2   8.5   8.2      No results found.  Disposition Plan & Communication  Patient status: Inpatient  Admitted From: Home Planned disposition location: SNF Anticipated discharge date: 5/30 pending SNF bed availability  Family Communication: daughter on phone   Author: Richarda Osmond, DO [facility] 05/16/2022, 7:33 AM   Available by Epic secure chat 7AM-7PM. If 7PM-7AM, please contact night-coverage.  TRH contact information found on [email or web address].

## 2022-05-17 LAB — CULTURE, BLOOD (ROUTINE X 2)
Culture: NO GROWTH
Culture: NO GROWTH
Special Requests: ADEQUATE
Special Requests: ADEQUATE

## 2022-05-17 LAB — CBC
HCT: 29.2 % — ABNORMAL LOW (ref 36.0–46.0)
Hemoglobin: 8.2 g/dL — ABNORMAL LOW (ref 12.0–15.0)
MCH: 22 pg — ABNORMAL LOW (ref 26.0–34.0)
MCHC: 28.1 g/dL — ABNORMAL LOW (ref 30.0–36.0)
MCV: 78.3 fL — ABNORMAL LOW (ref 80.0–100.0)
Platelets: 311 10*3/uL (ref 150–400)
RBC: 3.73 MIL/uL — ABNORMAL LOW (ref 3.87–5.11)
RDW: 26.1 % — ABNORMAL HIGH (ref 11.5–15.5)
WBC: 7.4 10*3/uL (ref 4.0–10.5)
nRBC: 0 % (ref 0.0–0.2)

## 2022-05-17 LAB — GLUCOSE, CAPILLARY
Glucose-Capillary: 145 mg/dL — ABNORMAL HIGH (ref 70–99)
Glucose-Capillary: 241 mg/dL — ABNORMAL HIGH (ref 70–99)
Glucose-Capillary: 91 mg/dL (ref 70–99)
Glucose-Capillary: 209 mg/dL — ABNORMAL HIGH (ref 70–99)

## 2022-05-17 NOTE — Progress Notes (Signed)
PROGRESS NOTE  Melanie Bird    DOB: 1941/12/12, 81 y.o.  XBD:[id number]    Code Status: Full Code   DOA: 05/12/2022   LOS: 5   Brief hospital course  Melanie Bird is a 81 y.o. female with a PMH significant for GERD, hypertension, arthritis and iron deficiency anemia requiring iron infusions.  They presented from home to the ED on 05/12/2022 with weakness with N/V and bowel incontinence of loose stool x about 3 days. She denies seeing blood in stool or emesis.  In the ED, it was found that they had stable vital signs with exception of hypotension as low as 115/33 (which appears to be chronically low diastolics but not this severe).  Significant findings included: WBC 10.0, hgb 3.6, hematocrit 15.8, MCV 66.9, RDW 21.5, platelet count 589, lactic acid 5.4, sodium 139, potassium 4.1, chloride 109, bicarb 19, glucose 235, BUN 35, creatinine 1.23 compared to baseline of 0.99, calcium 8.2, total protein 6.1, albumin 3.1, AST 26, ALT 13 Urine analysis shows pyuria. Chest x-ray negative acute disease CT head without contrast shows no evidence of acute intracranial abnormality. Advanced chronic microvascular ischemic disease, which could easily obscure a white matter infarct. CT abdomen negative for acute findings. Twelve-lead EKG shows sinus rhythm with low voltage precordial leads  They were initially treated with IV PPI, IV fluid hydration, CTX for suspected UTI. She received 3 units pRBCs overnight. Hgb 3.6>8.4  GI was consulted for evaluation.  Patient was admitted to medicine service for further workup and management of symptomatic anemia as outlined in detail below.  05/17/22 -stable, ready for dc when SNF bed available  Assessment & Plan  Principal Problem:   Symptomatic anemia Active Problems:   Gastroenteritis   UTI (urinary tract infection)   Obesity   Lactic acidosis   HTN (hypertension)   Diabetes mellitus without complication (HCC)   Dehydration   Urinary tract infection,  acute  Symptomatic anemia  presumed GI bleed- hgb 3.6>>>>8.6>8.2 s/p 3 units pRBCs on admission. No reported blood seen in stool or emesis prior to admission. Evaluated by GI and patient and daughter declined endoscopic evaluation at this time. Thought to be related to NSAID use vs malignancy vs diverticular. Multiple stools.  - GI s/o - continue PPI - CBC am - anti-emetics PRN - PT/OT - TOC consulted for SNF placement  Acute delirium on chronic dementia- daughter confirms that family has been concerned about dementia for some time and has been gradually worsening. Patient had overnight event 5/26 treated with haldol. Patient is oriented and conversational today.  - continue tele sitter - avoid sedating medications - OOB during day - minimize cords, interventions at night time.  - windows open during the day and dark/quiet room at night  Hypoxia- potentially related to anemia. ORA and comfortable - Please only place on oxygen if she has an oxygen requirement and not just for comfort as oxygen has consequences of misuse when not needed.    UTI- patient denied urinary symptoms. Received CTX in ED - discontinue Abx at this time   Obesity BMI 83.41 kg/m2 Complicates overall prognosis and care Lifestyle modification and exercise has been discussed with patient in detail   Lactic acidosis- resolved. 2.7>1.4 Most likely secondary to diarrhea as well as metformin use No evidence of sepsis at this time   Diabetes mellitus without complication (HCC) - Glycemic control sliding scale insulin   HTN- improving with restarting home medications - continue home amlodipine and metoprolol  Body  mass index is 33.89 kg/m.  VTE ppx: SCDs Start: 05/12/22 1335  Diet:     Diet   Diet Carb Modified Fluid consistency: Thin; Room service appropriate? Yes   Consultants: GI Subjective 05/17/22    Pt reports no complaints today. She is doing well overall. Expresses desire to improve.    Objective   Vitals:   05/16/22 1654 05/16/22 1930 05/16/22 2320 05/17/22 0428  BP: (!) 131/55 (!) 125/59 (!) 149/60 (!) 151/64  Pulse: 93 (!) 103 99 96  Resp: '18 20 20 20  '$ Temp: 98.2 F (36.8 C) 98.1 F (36.7 C) 98.7 F (37.1 C) 98.2 F (36.8 C)  TempSrc:      SpO2: 97% 95% 95% 92%  Weight:      Height:        Intake/Output Summary (Last 24 hours) at 05/17/2022 0744 Last data filed at 05/16/2022 1408 Gross per 24 hour  Intake 240 ml  Output 375 ml  Net -135 ml    Filed Weights   05/12/22 0908 05/12/22 1539  Weight: 86.2 kg 76.1 kg     Physical Exam:  General: awake, alert, NAD HEENT: atraumatic, clear conjunctiva, anicteric sclera, MMM, hard of hearing Respiratory: normal respiratory effort. Cardiovascular: quick capillary refill Nervous: A&O x3. no gross focal neurologic deficits, normal speech Extremities: moves all equally, trace LE edema, normal tone Skin: dry, intact, normal temperature, normal color. No rashes, lesions or ulcers on exposed skin  Labs   I have personally reviewed the following labs and imaging studies CBC    Component Value Date/Time   WBC 7.4 05/17/2022 0555   RBC 3.73 (L) 05/17/2022 0555   HGB 8.2 (L) 05/17/2022 0555   HGB 14.7 09/29/2014 1526   HGB 10.6 (L) 01/12/2012 1155   HCT 29.2 (L) 05/17/2022 0555   HCT 29.1 (L) 11/10/2015 1227   HCT 33.7 (L) 01/12/2012 1155   PLT 311 05/17/2022 0555   PLT 250 09/29/2014 1526   PLT 485 (H) 01/12/2012 1155   MCV 78.3 (L) 05/17/2022 0555   MCV 93 09/29/2014 1526   MCV 90.1 01/12/2012 1155   MCH 22.0 (L) 05/17/2022 0555   MCHC 28.1 (L) 05/17/2022 0555   RDW 26.1 (H) 05/17/2022 0555   RDW 14.3 09/29/2014 1526   RDW 15.3 (H) 01/12/2012 1155   LYMPHSABS 1.3 05/12/2022 0907   LYMPHSABS 1.8 09/29/2014 1526   LYMPHSABS 1.1 01/12/2012 1155   MONOABS 0.8 05/12/2022 0907   MONOABS 0.7 09/29/2014 1526   MONOABS 0.5 01/12/2012 1155   EOSABS 0.0 05/12/2022 0907   EOSABS 0.2 09/29/2014 1526    EOSABS 0.2 08/24/2010 1028   BASOSABS 0.0 05/12/2022 0907   BASOSABS 0.2 (H) 09/29/2014 1526   BASOSABS 0.0 01/12/2012 1155      Latest Ref Rng & Units 05/16/2022    4:30 AM 05/14/2022    3:43 AM 05/13/2022    7:50 AM  BMP  Glucose 70 - 99 mg/dL 135   167   137    BUN 8 - 23 mg/dL '20   13   15    '$ Creatinine 0.44 - 1.00 mg/dL 0.85   0.82   0.88    Sodium 135 - 145 mmol/L 139   140   139    Potassium 3.5 - 5.1 mmol/L 3.5   3.7   3.8    Chloride 98 - 111 mmol/L 106   109   110    CO2 22 - 32 mmol/L 25   23  22    Calcium 8.9 - 10.3 mg/dL 8.2   8.5   8.2     No results found.  Disposition Plan & Communication  Patient status: Inpatient  Admitted From: Home Planned disposition location: SNF Anticipated discharge date: 5/31 pending SNF bed availability  Family Communication: daughter on phone and at bedside   Author: Richarda Osmond, DO [facility] 05/17/2022, 7:44 AM   Available by Epic secure chat 7AM-7PM. If 7PM-7AM, please contact night-coverage.  TRH contact information found on [email or web address].

## 2022-05-17 NOTE — Telephone Encounter (Signed)
Noted  Pt scheduled 05/25/22

## 2022-05-17 NOTE — Progress Notes (Signed)
Tele sitter safety obs order expired- MD paged regarding renewal. No renewal required per MD. Karen Chafe sitter discontinued at this time.

## 2022-05-17 NOTE — Progress Notes (Signed)
Report given to 1C nurse Jacky. Patient and her family notified. No questions at this time. Patient belongs sent with the patient.

## 2022-05-17 NOTE — TOC Progression Note (Addendum)
Transition of Care [facility]) - Progression Note    Patient Details  Name: Melanie Bird MRN: [id number] Date of Birth: 08-03-1941  Transition of Care [facility]) CM/SW [place], LCSW Phone Number: 05/17/2022, 9:03 AM  Clinical Narrative:  Sent out SNF referral.   10:23 am: Called daughter. First preference SNF is [facility]. Uploaded clinicals in [place] portal to start insurance authorization. Telesitter order expired at 9:45 am. Will have to be 24 hours without sitter prior to discharge to SNF.  1:15 pm: Auth approved: [id number]. Valid 5/31-6/2. Daughter is aware. Left admissions coordinator a message with Josem Kaufmann details.  Expected Discharge Plan and Services                                                 Social Determinants of Health (SDOH) Interventions    Readmission Risk Interventions     View : No data to display.

## 2022-05-17 NOTE — Progress Notes (Signed)
Physical Therapy Treatment Patient Details Name: Melanie Bird MRN: [id number] DOB: August 30, 1941 Today's Date: 05/17/2022   History of Present Illness Pt is an 81 y/o F admitted on 05/12/22 after presenting with c/o weakness, N&V, & bowel incontinence x 3 days. PMH: GERD, HTN, arthritis, iron deficiency anemia requiring infusions    PT Comments    Pt is making gradual progress towards goals with ability to ambulate in room using RW. Needs hands on assist to maintain safe technique using RW. Still reports pain in R knee with exertion. Agreeable to sit in recliner at end of session, CNA notified. Will continue to progress as able.   Recommendations for follow up therapy are one component of a multi-disciplinary discharge planning process, led by the attending physician.  Recommendations may be updated based on patient status, additional functional criteria and insurance authorization.  Follow Up Recommendations  Skilled nursing-short term rehab (<3 hours/day)     Assistance Recommended at Discharge Frequent or constant Supervision/Assistance  Patient can return home with the following A lot of help with walking and/or transfers;A lot of help with bathing/dressing/bathroom;Assist for transportation;Assistance with cooking/housework;Direct supervision/assist for financial management;Direct supervision/assist for medications management;Help with stairs or ramp for entrance   Equipment Recommendations  None recommended by PT    Recommendations for Other Services       Precautions / Restrictions Precautions Precautions: Fall Restrictions Weight Bearing Restrictions: No     Mobility  Bed Mobility Overal bed mobility: Needs Assistance Bed Mobility: Supine to Sit, Sit to Supine     Supine to sit: Min assist     General bed mobility comments: reaches for therapist for assistance. Once seated at EOB, able to progress to supervision.    Transfers Overall transfer level: Needs  assistance Equipment used: Rolling walker (2 wheels) Transfers: Sit to/from Stand Sit to Stand: Min assist           General transfer comment: needs cues for hand placement. RW used once standing. Needs step by step instructions for transfers    Ambulation/Gait Ambulation/Gait assistance: Min assist Gait Distance (Feet): 40 Feet Assistive device: Rolling walker (2 wheels) Gait Pattern/deviations: Step-to pattern       General Gait Details: needs heavy cues for sequencng and pt tends to keep RW too far away from body during ambulation. Manual assist to keep RW at safe distance. Fatigues with 1 slight knee buckle noted.   Stairs             Wheelchair Mobility    Modified Rankin (Stroke Patients Only)       Balance Overall balance assessment: Needs assistance Sitting-balance support: Feet supported Sitting balance-Leahy Scale: Fair     Standing balance support: Reliant on assistive device for balance, During functional activity, Bilateral upper extremity supported Standing balance-Leahy Scale: Fair                              Cognition Arousal/Alertness: Awake/alert Behavior During Therapy: WFL for tasks assessed/performed Overall Cognitive Status: History of cognitive impairments - at baseline                                 General Comments: alert and oriented, although is confused to situation        Exercises Other Exercises Other Exercises: supine ther-ex performed on B LE including AP, SLRs, hip abd, and knee flexion. 10 reps  performed with slight pain in R knee with movement. Min assist for perforamnce    General Comments        Pertinent Vitals/Pain Pain Assessment Pain Assessment: Faces Faces Pain Scale: Hurts little more Pain Location: R knee Pain Descriptors / Indicators: Aching Pain Intervention(s): Limited activity within patient's tolerance, Premedicated before session, Repositioned    Home Living                           Prior Function            PT Goals (current goals can now be found in the care plan section) Acute Rehab PT Goals Patient Stated Goal: get better PT Goal Formulation: With patient/family Time For Goal Achievement: 05/29/22 Potential to Achieve Goals: Good Progress towards PT goals: Progressing toward goals    Frequency    Min 2X/week      PT Plan Current plan remains appropriate    Co-evaluation              AM-PAC PT "6 Clicks" Mobility   Outcome Measure  Help needed turning from your back to your side while in a flat bed without using bedrails?: A Little Help needed moving from lying on your back to sitting on the side of a flat bed without using bedrails?: A Little Help needed moving to and from a bed to a chair (including a wheelchair)?: A Little Help needed standing up from a chair using your arms (e.g., wheelchair or bedside chair)?: A Little Help needed to walk in hospital room?: A Lot Help needed climbing 3-5 steps with a railing? : A Lot 6 Click Score: 16    End of Session Equipment Utilized During Treatment: Gait belt Activity Tolerance: Patient tolerated treatment well Patient left: in chair;with family/visitor present Nurse Communication: Mobility status PT Visit Diagnosis: Unsteadiness on feet (R26.81);Muscle weakness (generalized) (M62.81);Difficulty in walking, not elsewhere classified (R26.2)     Time: 0626-9485 PT Time Calculation (min) (ACUTE ONLY): 23 min  Charges:  $Gait Training: 8-22 mins $Therapeutic Exercise: 8-22 mins                     Melanie Bird, PT, DPT, GCS [phone]    Melanie Bird 05/17/2022, 12:34 PM

## 2022-05-18 DIAGNOSIS — D649 Anemia, unspecified: Secondary | ICD-10-CM | POA: Diagnosis not present

## 2022-05-18 LAB — GLUCOSE, CAPILLARY
Glucose-Capillary: 139 mg/dL — ABNORMAL HIGH (ref 70–99)
Glucose-Capillary: 299 mg/dL — ABNORMAL HIGH (ref 70–99)

## 2022-05-18 MED ORDER — ONDANSETRON HCL 4 MG PO TABS: 4.0000 mg | ORAL_TABLET | Freq: Four times a day (QID) | ORAL | 0 refills | Status: AC | PRN

## 2022-05-18 MED ORDER — FERROUS SULFATE 325 (65 FE) MG PO TBEC: 325.0000 mg | DELAYED_RELEASE_TABLET | Freq: Two times a day (BID) | ORAL | 1 refills | Status: DC

## 2022-05-18 MED ORDER — OMEPRAZOLE 10 MG PO CPDR: 40.0000 mg | DELAYED_RELEASE_CAPSULE | Freq: Every day | ORAL | Status: DC

## 2022-05-18 NOTE — Discharge Summary (Signed)
Physician Discharge Summary   Patient: Melanie Bird MRN: [id number] DOB: [birth year]  Admit date:     05/12/2022  Discharge date: 05/18/22  Discharge Physician: Ezekiel Slocumb   PCP: Burnard Hawthorne, FNP   Recommendations at discharge:    Follow up with Primary Care in 1-2 weeks Repeat CBC, BMP in 1 week Follow up with Hematology/Oncology  Discharge Diagnoses: Principal Problem:   Symptomatic anemia Active Problems:   Gastroenteritis   UTI (urinary tract infection)   Obesity   Lactic acidosis   HTN (hypertension)   Diabetes mellitus without complication (HCC)   Dehydration   Urinary tract infection, acute   Hospital Course: Melanie Bird is a 81 y.o. female with a PMH significant for GERD, hypertension, arthritis and iron deficiency anemia requiring iron infusions.   They presented from home to the ED on 05/12/2022 with weakness with N/V and bowel incontinence of loose stool x about 3 days. She denies seeing blood in stool or emesis.   In the ED, it was found that they had stable vital signs with exception of hypotension as low as 115/33 (which appears to be chronically low diastolics but not this severe).  Significant findings included: WBC 10.0, hgb 3.6, hematocrit 15.8, MCV 66.9, RDW 21.5, platelet count 589, lactic acid 5.4, sodium 139, potassium 4.1, chloride 109, bicarb 19, glucose 235, BUN 35, creatinine 1.23 compared to baseline of 0.99, calcium 8.2, total protein 6.1, albumin 3.1, AST 26, ALT 13 Urine analysis shows pyuria. Chest x-ray negative acute disease CT head without contrast shows no evidence of acute intracranial abnormality. Advanced chronic microvascular ischemic disease, which could easily obscure a white matter infarct. CT abdomen negative for acute findings. Twelve-lead EKG shows sinus rhythm with low voltage precordial leads   They were initially treated with IV PPI, IV fluid hydration, CTX for suspected UTI. She received 3 units pRBCs overnight.  Hgb 3.6>8.4  GI was consulted for evaluation.  No endoscopic evaluation, declined due to co-morbidities.  Iron studies revealed worsened iron deficiency, discharged on PO iron supplement.  Recommend following back up with hematology,   Patient was admitted to medicine service for further workup and management of symptomatic anemia as outlined in detail below.  Hospital course involved some delirium superimposed on suspected baseline dementia.  Patient has not required tele-sitter in 24 hours.   05/18/22 -stable, medically ready for dc to SNF for rehab today.    Assessment and Plan: Symptomatic anemia  presumed GI bleed- hgb 3.6>>>>8.6>8.2 s/p 3 units pRBCs on admission. No reported blood seen in stool or emesis prior to admission. Evaluated by GI and patient and daughter declined endoscopic evaluation at this time. Thought to be related to NSAID use vs malignancy vs diverticular. Multiple stools.  - GI s/o - Treated with IV PPI.  Increased omeprazole dose to 40 mg daily. - CBC within 1 week - started on oral iron supplement (iron low at 17) - hematology follow up outpatient - anti-emetics PRN  Generalized weakness - PT/OT - SNF recommended, bed available per TOC  Acute delirium on chronic dementia- daughter confirms that family has been concerned about dementia for some time and has been gradually worsening. Patient had overnight event 5/26 treated with haldol. Patient is oriented and conversational today.  - off tele sitter and doing well - avoid sedating medications - OOB during day - minimize cords, interventions at night time.  - windows open during the day and dark/quiet room at night   Hypoxia- potentially  related to anemia.  Resolved.  Now on room air.   UTI- patient denied urinary symptoms. Received CTX in ED - discontinued Abx and stable without symptoms   Lactic acidosis- resolved. 2.7>1.4 Most likely secondary to diarrhea as well as metformin use No evidence of  sepsis at this time   Diabetes mellitus without complication (HCC) - Glycemic control sliding scale insulin   HTN- improving with restarting home medications - continue amlodipine and metoprolol   Obesity - Body mass index is 33.89 kg/m. Complicates overall care and prognosis.  Recommend lifestyle modifications including physical activity and diet for weight loss and overall long-term health.          Consultants: Gastroenterology Procedures performed: None  Disposition: Skilled nursing facility Diet recommendation:  Carb modified diet DISCHARGE MEDICATION: Allergies as of 05/18/2022   No Known Allergies      Medication List     STOP taking these medications    Ventolin HFA 108 (90 Base) MCG/ACT inhaler Generic drug: albuterol       TAKE these medications    amLODipine 5 MG tablet Commonly known as: NORVASC TAKE 1 TABLET BY MOUTH EVERY DAY   diclofenac Sodium 1 % Gel Commonly known as: Voltaren Apply 2-4 g topically 4 (four) times daily. 2 grams upper body and 4 grams lower body   ferrous sulfate 325 (65 FE) MG EC tablet Take 1 tablet (325 mg total) by mouth 2 (two) times daily.   latanoprost 0.005 % ophthalmic solution Commonly known as: XALATAN Place 1 drop into both eyes at bedtime.   meloxicam 7.5 MG tablet Commonly known as: MOBIC TAKE 1 TABLET (7.5 MG TOTAL) BY MOUTH DAILY AS NEEDED FOR PAIN (SEVERE PAIN).   metFORMIN 500 MG 24 hr tablet Commonly known as: GLUCOPHAGE-XR START 1 TABLET BY MOUTH DAILY EVERY EVENING   metoprolol tartrate 25 MG tablet Commonly known as: LOPRESSOR TAKE 1 TABLET BY MOUTH TWICE A DAY   omeprazole 10 MG capsule Commonly known as: PRILOSEC Take 4 capsules (40 mg total) by mouth daily. What changed: how much to take   ondansetron 4 MG tablet Commonly known as: ZOFRAN Take 1 tablet (4 mg total) by mouth every 6 (six) hours as needed for nausea.   Simbrinza 1-0.2 % Susp Generic drug: Brinzolamide-Brimonidine    traZODone 50 MG tablet Commonly known as: DESYREL TAKE 2 TABLETS (100 MG TOTAL) BY MOUTH AT BEDTIME.        Contact information for after-discharge care     [facility] .   Service: Skilled Nursing Contact information: [place] [phone]                    Discharge Exam: Danley Danker Weights   05/12/22 0908 05/12/22 1539  Weight: 86.2 kg 76.1 kg   General exam: awake, alert, no acute distress, obese HEENT: moist mucus membranes, hearing grossly normal  Respiratory system: decreased breath sounds, normal respiratory effort, on room air. Cardiovascular system: normal S1/S2, RRR Gastrointestinal system: soft, NT, ND, no HSM felt, +bowel sounds. Central nervous system: A&O x. no gross focal neurologic deficits, normal speech Extremities: moves all, no edema, normal tone Skin: dry, intact, normal temperature Psychiatry: normal mood, congruent affect, judgement and insight appear normal   Condition at discharge: stable  The results of significant diagnostics from this hospitalization (including imaging, microbiology, ancillary and laboratory) are listed below for reference.   Imaging Studies: CT  Head Wo Contrast  Result Date: 05/12/2022 CLINICAL DATA:  Delirium. EXAM: CT HEAD WITHOUT CONTRAST TECHNIQUE: Contiguous axial images were obtained from the base of the skull through the vertex without intravenous contrast. RADIATION DOSE REDUCTION: This exam was performed according to the departmental dose-optimization program which includes automated exposure control, adjustment of the mA and/or kV according to patient size and/or use of iterative reconstruction technique. COMPARISON:  None Available. FINDINGS: Brain: No evidence of acute large vascular territory infarction, hemorrhage, hydrocephalus, extra-axial collection or mass lesion/mass effect. Advanced confluent patchy white matter  hypoattenuation, nonspecific but compatible with chronic microvascular ischemic disease. Vascular: No hyperdense vessel identified. Calcific intracranial atherosclerosis. Skull: No acute fracture. Sinuses/Orbits: Clear sinuses.  No acute orbital findings. Other: No mastoid effusions. IMPRESSION: 1. No evidence of acute intracranial abnormality. 2. Advanced chronic microvascular ischemic disease, which could easily obscure a white matter infarct. If there is concern for acute infarct, recommend MRI for more sensitive evaluation. Electronically Signed   By: Margaretha Sheffield M.D.   On: 05/12/2022 11:08   CT ABDOMEN PELVIS W CONTRAST  Result Date: 05/12/2022 CLINICAL DATA:  Diarrhea. EXAM: CT ABDOMEN AND PELVIS WITH CONTRAST TECHNIQUE: Multidetector CT imaging of the abdomen and pelvis was performed using the standard protocol following bolus administration of intravenous contrast. RADIATION DOSE REDUCTION: This exam was performed according to the departmental dose-optimization program which includes automated exposure control, adjustment of the mA and/or kV according to patient size and/or use of iterative reconstruction technique. CONTRAST:  76m OMNIPAQUE IOHEXOL 300 MG/ML  SOLN COMPARISON:  None Available. FINDINGS: Lower chest: No acute abnormality. Hepatobiliary: Focal area of low attenuation within the lateral segment of left hepatic lobe adjacent to the falciform ligament is favored to represent focal fatty deposition. No suspicious liver lesions identified. Multiple gallstones are noted measuring up to 9 mm. No gallbladder wall inflammation or signs of bile duct dilatation. Pancreas: Unremarkable. No pancreatic ductal dilatation or surrounding inflammatory changes. Spleen: Normal in size without focal abnormality. Adrenals/Urinary Tract: Normal appearance of the right adrenal gland. Left adrenal nodule measures 4.0 by 3.2 cm with Hounsfield units on portal venous phase imaging measuring 19.0, image 25/2.  On the delayed phase images Hounsfield units equal 9.53. 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 No follow-up imaging is recommended. Small stone within the upper pole of the right kidney measures 2 mm, image 33/2. No left renal calculi. No signs of hydronephrosis. Small foci of gas noted within the dome of urinary bladder. Bladder is otherwise unremarkable. Stomach/Bowel: Moderate size hiatal hernia. The appendix is visualized and appears normal. No bowel wall thickening, inflammation, or distension. Reproductive: Uterus and bilateral adnexa are unremarkable. Other: No free fluid or fluid collections identified. 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 Diffuse osteopenia. 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 IMPRESSION: 1. No acute findings within the abdomen or pelvis. 2. Small foci of gas noted within the dome of urinary bladder. Correlate for any clinical signs or symptoms of cystitis. 3. Gallstones. 4. Moderate size hiatal hernia. Electronically Signed   By: TKerby MoorsM.D.   On: 05/12/2022 14:47   DG Chest Port 1 View  Result Date: 05/12/2022 CLINICAL DATA:  Questionable sepsis - evaluate for abnormality EXAM: PORTABLE CHEST 1 VIEW COMPARISON:  November 2019 FINDINGS: No new consolidation or edema. No significant pleural effusion. No pneumothorax. Similar cardiomediastinal contours. Hiatal hernia. IMPRESSION: No acute process in the chest. Electronically Signed   By: PMacy MisM.D.   On: 05/12/2022 09:49  Microbiology: Results for orders placed or performed during the hospital encounter of 05/12/22  Blood Culture (routine x 2)     Status: None   Collection Time: 05/12/22  9:07 AM   Specimen: BLOOD  Result Value Ref Range Status   Specimen Description BLOOD BLOOD RIGHT WRIST  Final   Special Requests   Final    BOTTLES DRAWN AEROBIC AND ANAEROBIC Blood Culture adequate volume   Culture   Final    NO GROWTH 5 DAYS Performed at [facility], [address] [place], [place] [address]    Report Status 05/17/2022 FINAL  Final  Urine Culture     Status: None   Collection Time: 05/12/22  9:07 AM   Specimen: Urine, Random  Result Value Ref Range Status   Specimen Description   Final    URINE, RANDOM Performed at [facility], [address], [place], [place] [address]    Special Requests   Final    NONE Performed at [facility], [address] [place], [place] [address]    Culture   Final    NO GROWTH Performed at [facility], [place] [address] [place], [place] [address]    Report Status 05/13/2022 FINAL  Final  SARS Coronavirus 2 by RT PCR (hospital order, performed in [facility] hospital lab) *cepheid single result test* Anterior Nasal Swab     Status: None   Collection Time: 05/12/22  9:07 AM   Specimen: Anterior Nasal Swab  Result Value Ref Range Status   SARS Coronavirus 2 by RT PCR NEGATIVE NEGATIVE Final    Comment: (NOTE) SARS-CoV-2 target nucleic acids are NOT DETECTED.  The SARS-CoV-2 RNA is generally detectable in upper and lower respiratory specimens during the acute phase of infection. The lowest concentration of SARS-CoV-2 viral copies this assay can detect is 250 copies / mL. A negative result does not preclude SARS-CoV-2 infection and should not be used as the sole basis for treatment or other patient management decisions.  A negative result may occur with improper specimen collection / handling, submission of specimen other than nasopharyngeal swab, presence of viral mutation(s) within the areas targeted by this assay, and inadequate number of viral copies (<250 copies / mL). A negative result must be combined with clinical observations, patient history, and epidemiological information.  Fact Sheet for Patients:   https://www.patel.info/  Fact Sheet for Healthcare Providers: https://hall.com/  This test is not yet  approved or  cleared by the [place] FDA and has been authorized for detection and/or diagnosis of SARS-CoV-2 by FDA under an Emergency Use Authorization (EUA).  This EUA will remain in effect (meaning this test can be used) for the duration of the COVID-19 declaration under Section 564(b)(1) of the Act, 21 U.S.C. section 360bbb-3(b)(1), unless the authorization is terminated or revoked sooner.  Performed at [facility], [address] [place], [place] [address]   Blood Culture (routine x 2)     Status: None   Collection Time: 05/12/22  9:46 AM   Specimen: BLOOD  Result Value Ref Range Status   Specimen Description BLOOD RIGHT ANTECUBITAL  Final   Special Requests   Final    BOTTLES DRAWN AEROBIC AND ANAEROBIC Blood Culture adequate volume   Culture   Final    NO GROWTH 5 DAYS Performed at [facility], [address] [place],  [address]    Report Status 05/17/2022 FINAL  Final  Gastrointestinal Panel by PCR ,  Stool     Status: None   Collection Time: 05/12/22  9:35 PM   Specimen: Rectum; Stool  Result Value Ref Range Status   Campylobacter species NOT DETECTED NOT DETECTED Final   Plesimonas shigelloides NOT DETECTED NOT DETECTED Final   Salmonella species NOT DETECTED NOT DETECTED Final   Yersinia enterocolitica NOT DETECTED NOT DETECTED Final   Vibrio species NOT DETECTED NOT DETECTED Final   Vibrio cholerae NOT DETECTED NOT DETECTED Final   Enteroaggregative E coli (EAEC) NOT DETECTED NOT DETECTED Final   Enteropathogenic E coli (EPEC) NOT DETECTED NOT DETECTED Final   Enterotoxigenic E coli (ETEC) NOT DETECTED NOT DETECTED Final   Shiga like toxin producing E coli (STEC) NOT DETECTED NOT DETECTED Final   Shigella/Enteroinvasive E coli (EIEC) NOT DETECTED NOT DETECTED Final   Cryptosporidium NOT DETECTED NOT DETECTED Final   Cyclospora cayetanensis NOT DETECTED NOT DETECTED Final   Entamoeba histolytica NOT DETECTED NOT DETECTED Final    Giardia lamblia NOT DETECTED NOT DETECTED Final   Adenovirus F40/41 NOT DETECTED NOT DETECTED Final   Astrovirus NOT DETECTED NOT DETECTED Final   Norovirus GI/GII NOT DETECTED NOT DETECTED Final   Rotavirus A NOT DETECTED NOT DETECTED Final   Sapovirus (I, II, IV, and V) NOT DETECTED NOT DETECTED Final    Comment: Performed at [facility], [address] [place], [place] [address]    Labs: CBC: Recent Labs  Lab 05/12/22 [phone] 05/12/22 2153 05/13/22 0750 05/14/22 0343 05/15/22 0354 05/16/22 0430 05/17/22 0555  WBC 10.0   < > 8.7 9.8 9.0 9.1 7.4  NEUTROABS 7.8*  --   --   --   --   --   --   HGB 3.6*   < > 8.4* 8.4* 8.2* 8.6* 8.2*  HCT 15.8*   < > 28.8* 28.6* 29.4* 30.9* 29.2*  MCV 66.9*   < > 74.0* 74.9* 77.2* 78.0* 78.3*  PLT 589*   < > 448* 375 360 357 311   < > = values in this interval not displayed.   Basic Metabolic Panel: Recent Labs  Lab 05/12/22 0907 05/12/22 2153 05/13/22 0750 05/14/22 0343 05/16/22 0430  NA 139 142 139 140 139  K 4.1 4.2 3.8 3.7 3.5  CL 109 113* 110 109 106  CO2 19* '22 22 23 25  '$ GLUCOSE 235* 129* 137* 167* 135*  BUN 35* 24* '15 13 20  '$ CREATININE 1.23* 0.88 0.88 0.82 0.85  CALCIUM 8.2* 8.0* 8.2* 8.5* 8.2*   Liver Function Tests: Recent Labs  Lab 05/12/22 0907 05/16/22 0430  AST 26 14*  ALT 13 12  ALKPHOS 57 60  BILITOT 0.6 0.6  PROT 6.1* 6.1*  ALBUMIN 3.1* 2.9*   CBG: Recent Labs  Lab 05/17/22 0743 05/17/22 1219 05/17/22 1558 05/17/22 2135 05/18/22 0909  GLUCAP 145* 241* 91 209* 139*    Discharge time spent: greater than 30 minutes.  Signed: Ezekiel Slocumb, DO [facility] 05/18/2022

## 2022-05-18 NOTE — TOC Progression Note (Signed)
Transition of Care [facility]) - Progression Note    Patient Details  Name: Melanie Bird MRN: [id number] Date of Birth: 1941/05/04  Transition of Care [facility]) CM/SW [place], RN Phone Number: 05/18/2022, 10:33 AM  Clinical Narrative:   Patient transferring to [facility] care today daughter aware.  Room 4B  as per Kenney Houseman at facility         Expected Discharge Plan and Services                                                 Social Determinants of Health (SDOH) Interventions    Readmission Risk Interventions     View : No data to display.

## 2022-05-18 NOTE — Progress Notes (Signed)
Pt A/Ox4 conversational. Lanelle Bal, daughter, in room. Patient changed into paper gown. Report called to [employer] [facility], RN Crystal. PIV removed. No further needs.

## 2022-05-25 ENCOUNTER — Ambulatory Visit: Payer: Medicare Other | Admitting: Family

## 2022-06-03 ENCOUNTER — Telehealth: Payer: Self-pay | Admitting: Family

## 2022-06-03 NOTE — Telephone Encounter (Signed)
  I have reviewed the above information and agree with above.   Rillie Riffel, MD 

## 2022-06-03 NOTE — Telephone Encounter (Signed)
Merleen Nicely From [facility] called in stated will NP Arnett sign off on  order for follow up care for home health care for pt.Marland KitchenMarland KitchenMarland Kitchen

## 2022-06-06 NOTE — Telephone Encounter (Signed)
Noted and will wait for orders fax to give to Dr. Derrel Nip.

## 2022-06-06 NOTE — Telephone Encounter (Signed)
Merleen Nicely called from [facility] to state patient was discharged from [facility] this past weekend with orders for physical therapy and occupational therapy.  Merleen Nicely would like to know if Mable Paris, NP, or Deborra Medina, MD, will follow for patient.  I let Merleen Nicely know, per my supervisor Bud Face), that Dr. Derrel Nip will follow for patient until Mable Paris, NP, returns next week.  I asked Merleen Nicely to please fax the orders to [place].

## 2022-06-06 NOTE — Telephone Encounter (Signed)
Kelsey from [employer] called wanting to know if Derrel Nip has signed off on order

## 2022-06-08 NOTE — Telephone Encounter (Signed)
Still have not received orders. Have you gotten them?

## 2022-06-09 ENCOUNTER — Telehealth: Payer: Self-pay | Admitting: Internal Medicine

## 2022-06-13 ENCOUNTER — Other Ambulatory Visit: Payer: Self-pay | Admitting: Family

## 2022-06-13 ENCOUNTER — Telehealth: Payer: Self-pay | Admitting: Family

## 2022-06-13 ENCOUNTER — Inpatient Hospital Stay: Payer: Medicare Other | Admitting: Internal Medicine

## 2022-06-13 ENCOUNTER — Telehealth: Payer: Self-pay | Admitting: Internal Medicine

## 2022-06-13 MED ORDER — TIZANIDINE HCL 2 MG PO TABS
2.0000 mg | ORAL_TABLET | Freq: Three times a day (TID) | ORAL | 0 refills | Status: DC | PRN
Start: 2022-06-13 — End: 2022-07-25

## 2022-06-13 NOTE — Telephone Encounter (Signed)
Patient's daughter called, patient has started PT last week and her back is starting to hurt. Physical therapist recommended to her to call her doctor to get some muscle relaxers so she could continue PT. Daughter is requesting medication asap , so the patient can have PT tomorrow. Please call daughter at [phone].

## 2022-06-16 DIAGNOSIS — Z87440 Personal history of urinary (tract) infections: Secondary | ICD-10-CM

## 2022-06-16 DIAGNOSIS — F0394 Unspecified dementia, unspecified severity, with anxiety: Secondary | ICD-10-CM | POA: Diagnosis not present

## 2022-06-16 DIAGNOSIS — M810 Age-related osteoporosis without current pathological fracture: Secondary | ICD-10-CM

## 2022-06-16 DIAGNOSIS — I10 Essential (primary) hypertension: Secondary | ICD-10-CM | POA: Diagnosis not present

## 2022-06-16 DIAGNOSIS — Z9181 History of falling: Secondary | ICD-10-CM

## 2022-06-16 DIAGNOSIS — H409 Unspecified glaucoma: Secondary | ICD-10-CM

## 2022-06-16 DIAGNOSIS — E669 Obesity, unspecified: Secondary | ICD-10-CM

## 2022-06-16 DIAGNOSIS — E039 Hypothyroidism, unspecified: Secondary | ICD-10-CM

## 2022-06-16 DIAGNOSIS — K5900 Constipation, unspecified: Secondary | ICD-10-CM

## 2022-06-16 DIAGNOSIS — D509 Iron deficiency anemia, unspecified: Secondary | ICD-10-CM | POA: Diagnosis not present

## 2022-06-16 DIAGNOSIS — G4733 Obstructive sleep apnea (adult) (pediatric): Secondary | ICD-10-CM

## 2022-06-16 DIAGNOSIS — F32A Depression, unspecified: Secondary | ICD-10-CM

## 2022-06-16 DIAGNOSIS — K219 Gastro-esophageal reflux disease without esophagitis: Secondary | ICD-10-CM

## 2022-06-16 DIAGNOSIS — E119 Type 2 diabetes mellitus without complications: Secondary | ICD-10-CM | POA: Diagnosis not present

## 2022-06-16 DIAGNOSIS — E890 Postprocedural hypothyroidism: Secondary | ICD-10-CM

## 2022-06-16 NOTE — Telephone Encounter (Signed)
Called patient but VM was full so I was unable to leave a message

## 2022-06-24 ENCOUNTER — Encounter: Payer: Self-pay | Admitting: Family

## 2022-06-24 ENCOUNTER — Ambulatory Visit (INDEPENDENT_AMBULATORY_CARE_PROVIDER_SITE_OTHER): Payer: Medicare Other | Admitting: Family

## 2022-06-24 VITALS — BP 128/82 | HR 72 | Temp 98.1°F | Ht 60.0 in | Wt 166.4 lb

## 2022-06-24 DIAGNOSIS — Z66 Do not resuscitate: Secondary | ICD-10-CM | POA: Diagnosis not present

## 2022-06-24 DIAGNOSIS — I10 Essential (primary) hypertension: Secondary | ICD-10-CM

## 2022-06-24 DIAGNOSIS — E119 Type 2 diabetes mellitus without complications: Secondary | ICD-10-CM | POA: Diagnosis not present

## 2022-06-24 DIAGNOSIS — D500 Iron deficiency anemia secondary to blood loss (chronic): Secondary | ICD-10-CM

## 2022-06-24 DIAGNOSIS — F324 Major depressive disorder, single episode, in partial remission: Secondary | ICD-10-CM

## 2022-06-24 DIAGNOSIS — M545 Low back pain, unspecified: Secondary | ICD-10-CM

## 2022-06-24 LAB — COMPREHENSIVE METABOLIC PANEL
ALT: 11 U/L (ref 0–35)
AST: 19 U/L (ref 0–37)
Albumin: 4 g/dL (ref 3.5–5.2)
Alkaline Phosphatase: 79 U/L (ref 39–117)
BUN: 20 mg/dL (ref 6–23)
CO2: 22 mEq/L (ref 19–32)
Calcium: 9.6 mg/dL (ref 8.4–10.5)
Chloride: 103 mEq/L (ref 96–112)
Creatinine, Ser: 1.13 mg/dL (ref 0.40–1.20)
GFR: 45.01 mL/min — ABNORMAL LOW (ref >60.00)
Glucose, Bld: 148 mg/dL — ABNORMAL HIGH (ref 70–99)
Potassium: 4.7 mEq/L (ref 3.5–5.1)
Sodium: 137 mEq/L (ref 135–145)
Total Bilirubin: 0.3 mg/dL (ref 0.2–1.2)
Total Protein: 7.2 g/dL (ref 6.0–8.3)

## 2022-06-24 LAB — CBC WITH DIFFERENTIAL/PLATELET
Basophils Absolute: 0.1 10*3/uL (ref 0.0–0.1)
Basophils Relative: 0.9 % (ref 0.0–3.0)
Eosinophils Absolute: 0.1 10*3/uL (ref 0.0–0.7)
Eosinophils Relative: 1 % (ref 0.0–5.0)
HCT: 38.3 % (ref 36.0–46.0)
Hemoglobin: 11.8 g/dL — ABNORMAL LOW (ref 12.0–15.0)
Lymphocytes Relative: 12.5 % (ref 12.0–46.0)
Lymphs Abs: 1.4 10*3/uL (ref 0.7–4.0)
MCHC: 30.8 g/dL (ref 30.0–36.0)
MCV: 82.8 fl (ref 78.0–100.0)
Monocytes Absolute: 0.7 10*3/uL (ref 0.1–1.0)
Monocytes Relative: 6.2 % (ref 3.0–12.0)
Neutro Abs: 9 10*3/uL — ABNORMAL HIGH (ref 1.4–7.7)
Neutrophils Relative %: 79.4 % — ABNORMAL HIGH (ref 43.0–77.0)
Platelets: 367 10*3/uL (ref 150.0–400.0)
RBC: 4.63 Mil/uL (ref 3.87–5.11)
RDW: 26.3 % — ABNORMAL HIGH (ref 11.5–15.5)
WBC: 11.3 10*3/uL — ABNORMAL HIGH (ref 4.0–10.5)

## 2022-06-24 LAB — LIPID PANEL
Cholesterol: 202 mg/dL — ABNORMAL HIGH (ref 0–200)
HDL: 54.1 mg/dL (ref >39.00)
NonHDL: 147.71
Total CHOL/HDL Ratio: 4
Triglycerides: 259 mg/dL — ABNORMAL HIGH (ref 0.0–149.0)
VLDL: 51.8 mg/dL — ABNORMAL HIGH (ref 0.0–40.0)

## 2022-06-24 LAB — IBC + FERRITIN
Ferritin: 42.9 ng/mL (ref 10.0–291.0)
Iron: 104 ug/dL (ref 42–145)
Saturation Ratios: 26.7 % (ref 20.0–50.0)
TIBC: 389.2 ug/dL (ref 250.0–450.0)
Transferrin: 278 mg/dL (ref 212.0–360.0)

## 2022-06-24 LAB — VITAMIN D 25 HYDROXY (VIT D DEFICIENCY, FRACTURES): VITD: 35.28 ng/mL (ref 30.00–100.00)

## 2022-06-24 LAB — LDL CHOLESTEROL, DIRECT: Direct LDL: 123 mg/dL

## 2022-06-24 LAB — B12 AND FOLATE PANEL
Folate: >24.2 ng/mL (ref >5.9)
Vitamin B-12: 392 pg/mL (ref 211–911)

## 2022-06-24 LAB — TSH: TSH: 4.31 u[IU]/mL (ref 0.35–5.50)

## 2022-06-24 MED ORDER — OMEPRAZOLE 40 MG PO CPDR: 40.0000 mg | DELAYED_RELEASE_CAPSULE | Freq: Every day | ORAL | 1 refills | Status: DC

## 2022-06-24 MED ORDER — ACETAMINOPHEN ER 650 MG PO TBCR: 650.0000 mg | EXTENDED_RELEASE_TABLET | Freq: Two times a day (BID) | ORAL | 2 refills | Status: AC

## 2022-06-24 NOTE — Progress Notes (Unsigned)
Subjective:    Patient ID: Melanie Bird, female    DOB: 1941/11/22, 81 y.o.   MRN: [id number]  CC: Melanie Bird is a 81 y.o. female who presents today for follow up and to re establish care .   HPI: She was last seen 09/01/2020 Accompanied by son and daughter  She is living with son who is primary caregiver. She will also use a wheelchair. She is walking with walker.   Home health is coming to house, PT, OT.   She complains of low midline back pain for years. Describes as Dull ache. Back pain limits mobility.   No constipation, dysuria, numbness, groin pain.  Taking tyelnol, occassional aleve with relief. Tizanidine was helpful and used for a couple of days.        She presented to emergency room 05/12/2022 for weakness and a fall.  She was discharged 05/18/2022.  for symptomatic anemia severe microcytic anemia with hemoglobin of 3.6--> 8.2 at discharge.  She been taken meloxicam, naproxen. Ct head - no evidence of acute intracranial abnormality, advanced chronic microvascular ischemic disease.  CT abdomen without acute findings.  No endoscopic evaluation, declined due to comorbidities.  Hospital course involved delirium superimposed on suspected baseline dementia.  Started on omeprazole 40 mg daily.  Status post blood transfusion recommended skilled nursing facility  Diabetes-compliant with 500 mg metformin daily  IDA-compliant with ferrous sulfate 325 mg twice daily. She is also taking omeprazole 72m qd  HTN-with amlodipine 5 mg, metoprolol tartrate 278mbid  Depression-compliant with trazodone 100 mg nightly HISTORY:  Past Medical History:  Diagnosis Date   Anemia    GERD (gastroesophageal reflux disease)    Hypertension    Past Surgical History:  Procedure Laterality Date   PARATHYROIDECTOMY     09/2018   Family History  Problem Relation Age of Onset   Diabetes Father    Hypertension Sister    Breast cancer Neg Hx     Allergies: Patient has no known  allergies. Current Outpatient Medications on File Prior to Visit  Medication Sig Dispense Refill   amLODipine (NORVASC) 5 MG tablet TAKE 1 TABLET BY MOUTH EVERY DAY 90 tablet 1   ferrous sulfate 325 (65 FE) MG EC tablet Take 1 tablet (325 mg total) by mouth 2 (two) times daily. 60 tablet 1   latanoprost (XALATAN) 0.005 % ophthalmic solution Place 1 drop into both eyes at bedtime.     ondansetron (ZOFRAN) 4 MG tablet Take 1 tablet (4 mg total) by mouth every 6 (six) hours as needed for nausea. 20 tablet 0   SIMBRINZA 1-0.2 % SUSP      tiZANidine (ZANAFLEX) 2 MG tablet Take 1 tablet (2 mg total) by mouth every 8 (eight) hours as needed for muscle spasms. 21 tablet 0   No current facility-administered medications on file prior to visit.    Social History   Tobacco Use   Smoking status: Never   Smokeless tobacco: Never  Substance Use Topics   Alcohol use: No   Drug use: No    Review of Systems  Constitutional:  Negative for chills and fever.  Respiratory:  Negative for cough.   Cardiovascular:  Negative for chest pain and palpitations.  Gastrointestinal:  Negative for nausea and vomiting.  Musculoskeletal:  Positive for back pain.  Neurological:  Negative for numbness.      Objective:    BP 128/82 (BP Location: Left Arm, Patient Position: Sitting, Cuff Size: Normal)   Pulse 72  Temp 98.1 F (36.7 C) (Oral)   Ht 5' (1.524 m)   Wt 166 lb 6.4 oz (75.5 kg)   SpO2 95%   BMI 32.50 kg/m  BP Readings from Last 3 Encounters:  06/24/22 128/82  05/18/22 (!) 147/68  10/16/20 (!) 146/65   Wt Readings from Last 3 Encounters:  06/24/22 166 lb 6.4 oz (75.5 kg)  05/12/22 167 lb 12.3 oz (76.1 kg)  10/16/20 171 lb (77.6 kg)    Physical Exam Vitals reviewed.  Constitutional:      Appearance: She is well-developed.  Eyes:     Conjunctiva/sclera: Conjunctivae normal.  Cardiovascular:     Rate and Rhythm: Normal rate and regular rhythm.     Pulses: Normal pulses.     Heart  sounds: Normal heart sounds.  Pulmonary:     Effort: Pulmonary effort is normal.     Breath sounds: Normal breath sounds. No wheezing, rhonchi or rales.  Musculoskeletal:     Lumbar back: Tenderness (lumbar spine and soft tissue) present. No swelling, edema, spasms or bony tenderness. Normal range of motion.     Comments: Full range of motion with flexion, tension, lateral side bends. No bony tenderness. No pain, numbness, tingling elicited with single leg raise bilaterally.  4/5 strength right LE. 5/5 strength left LE  Skin:    General: Skin is warm and dry.  Neurological:     Mental Status: She is alert.     Sensory: No sensory deficit.     Deep Tendon Reflexes:     Reflex Scores:      Patellar reflexes are 2+ on the right side and 2+ on the left side.    Comments: A x O x 4   Psychiatric:        Speech: Speech normal.        Behavior: Behavior normal.        Thought Content: Thought content normal.        Assessment & Plan:   Problem List Items Addressed This Visit       Cardiovascular and Mediastinum   HTN (hypertension)    Chronic, stable. Continue amlodipine 5 mg, metoprolol tartrate 43m bid        Endocrine   Diabetes mellitus without complication ([facility]    Lab Results  Component Value Date   HGBA1C 5.6 05/12/2022  Excellent control. Continue 500 mg metformin daily for now however we may discontinue at follow up as unnecessary.         Other   Depression, major, single episode, in partial remission (HCC)    Chronic, stable. Continue trazodone 1070m       Do not resuscitate    We had a long discussion in regards to what DO NOT RESUSCITATE order would mean.  Patient does not want  the use of artificial or heroic means to be kept alive. Patient would like me to write an order in the chart that if patient was to die, no attempt to resuscitate will be made.  Patient doesn't want intubation, CPR, ACLS or defibrillation. She would like comfort care and has  completed this in her Living will.  All questions answered and patient verbalized understanding of all. I have signed DO NOT RESUSCITATE order and provided patient with original copy.  Counseled patient on the importance of keeping DNR paperwork at home and visible so that people around patient are aware.         Relevant Orders   DNR (Do Not Resuscitate)  Iron deficiency anemia due to chronic blood loss    Reviewed hospitalization course with patient and family. Medications reconciled. She will continue ferrous sulfate 325 mg twice daily, omeprazole 53m qd.  Hemoglobin 11.8 after hospitalization.  Likely we will discontinue omeprazole at follow up. Family and patient understand to remain off of NSAIDs.       Low back pain - Primary    Chronic, limiting mobility. Patient and family decline dedicated imaging at this time including Xr and DEXA.  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 Diffuse osteopenia. 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 Avoid NSAIDs. She will start to schedule tylenol arthritis. May consider cymbalta if fails to improve.        Relevant Medications   omeprazole (PRILOSEC) 40 MG capsule   acetaminophen (TYLENOL 8 HOUR ARTHRITIS PAIN) 650 MG CR tablet   Other Relevant Orders   IBC + Ferritin (Completed)   CBC with Differential/Platelet (Completed)   Comprehensive metabolic panel (Completed)   PTH, Intact (ICMA) and Ionized Calcium (Completed)   Multiple Myeloma Panel (SPEP&IFE w/QIG) (Completed)   Protein electrophoresis, serum   PE and FLC, Serum (Completed)   Lipid panel (Completed)   VITAMIN D 25 Hydroxy (Vit-D Deficiency, Fractures) (Completed)   B12 and Folate Panel (Completed)   RPR   TSH (Completed)   Urine Culture (Completed)   Urinalysis, Routine w reflex microscopic (Completed)   IFE AND PE, RANDOM URINE (Completed)   Microalbumin / creatinine urine ratio (Completed)     I have discontinued  SParalee Cancel Arens's diclofenac Sodium, traZODone, meloxicam, metFORMIN, and omeprazole. I am also having her start on omeprazole and acetaminophen. Additionally, I am having her maintain her latanoprost, Simbrinza, amLODipine, ondansetron, ferrous sulfate, and tiZANidine.   Meds ordered this encounter  Medications   omeprazole (PRILOSEC) 40 MG capsule    Sig: Take 1 capsule (40 mg total) by mouth daily.    Dispense:  30 capsule    Refill:  1    Order Specific Question:   Supervising Provider    Answer:   TCrecencio Mc[[id number]]   acetaminophen (TYLENOL 8 HOUR ARTHRITIS PAIN) 650 MG CR tablet    Sig: Take 1 tablet (650 mg total) by mouth 2 (two) times daily.    Dispense:  180 tablet    Refill:  2    Order Specific Question:   Supervising Provider    Answer:   TCrecencio Mc[[id number]]    Return precautions given.   Risks, benefits, and alternatives of the medications and treatment plan prescribed today were discussed, and patient expressed understanding.   Education regarding symptom management and diagnosis given to patient on AVS.  Continue to follow with ABurnard Hawthorne FNP for routine health maintenance.   SMarjory Liesand I agreed with plan.   MMable Paris FNP

## 2022-06-24 NOTE — Patient Instructions (Addendum)
As discussed, let's start by scheduling Tylenol Arthritis which is a '650mg'$  tablet if needed for pain. I have prescribed medication.  Let me know if back pain improves.   Nice to see you

## 2022-06-24 NOTE — Progress Notes (Unsigned)
Needs handicapp plackard renewd

## 2022-06-24 NOTE — Telephone Encounter (Signed)
Saw patient in office today and was able to get everything taken care of

## 2022-06-25 LAB — URINE CULTURE
MICRO NUMBER:: 13617943
SPECIMEN QUALITY:: ADEQUATE

## 2022-06-26 ENCOUNTER — Other Ambulatory Visit: Payer: Self-pay | Admitting: Family

## 2022-06-27 LAB — URINALYSIS, ROUTINE W REFLEX MICROSCOPIC
Bilirubin Urine: NEGATIVE
Hgb urine dipstick: NEGATIVE
Ketones, ur: NEGATIVE
Leukocytes,Ua: NEGATIVE
Nitrite: NEGATIVE
Specific Gravity, Urine: 1.025 (ref 1.000–1.030)
Total Protein, Urine: NEGATIVE
Urine Glucose: NEGATIVE
Urobilinogen, UA: 0.2 (ref 0.0–1.0)
pH: 5.5 (ref 5.0–8.0)

## 2022-06-27 LAB — MICROALBUMIN / CREATININE URINE RATIO
Creatinine,U: 133 mg/dL
Microalb Creat Ratio: 0.8 mg/g (ref 0.0–30.0)
Microalb, Ur: 1.1 mg/dL (ref 0.0–1.9)

## 2022-06-28 LAB — IFE AND PE, RANDOM URINE

## 2022-06-28 NOTE — Assessment & Plan Note (Signed)
Chronic, stable. Continue trazodone '100mg'$ .

## 2022-06-28 NOTE — Assessment & Plan Note (Signed)
Chronic, stable. Continue amlodipine 5 mg, metoprolol tartrate '25mg'$  bid

## 2022-06-28 NOTE — Assessment & Plan Note (Signed)
We had a long discussion in regards to what DO NOT RESUSCITATE order would mean.  Patient does not want  the use of artificial or heroic means to be kept alive. Patient would like me to write an order in the chart that if patient was to die, no attempt to resuscitate will be made.  Patient doesn't want intubation, CPR, ACLS or defibrillation. She would like comfort care and has completed this in her Living will.  All questions answered and patient verbalized understanding of all. I have signed DO NOT RESUSCITATE order and provided patient with original copy.  Counseled patient on the importance of keeping DNR paperwork at home and visible so that people around patient are aware.

## 2022-06-28 NOTE — Assessment & Plan Note (Signed)
Lab Results  Component Value Date   HGBA1C 5.6 05/12/2022   Excellent control. Continue 500 mg metformin daily for now however we may discontinue at follow up as unnecessary.

## 2022-06-28 NOTE — Assessment & Plan Note (Signed)
Chronic, limiting mobility. Patient and family decline dedicated imaging at this time including Xr and DEXA.  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 Diffuse osteopenia. 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 Avoid NSAIDs. She will start to schedule tylenol arthritis. May consider cymbalta if fails to improve.

## 2022-06-28 NOTE — Assessment & Plan Note (Addendum)
Reviewed hospitalization course with patient and family. Medications reconciled. She will continue ferrous sulfate 325 mg twice daily, omeprazole '40mg'$  qd.  Hemoglobin 11.8 after hospitalization.  Likely we will discontinue omeprazole at follow up. Family and patient understand to remain off of NSAIDs.

## 2022-06-29 LAB — PROTEIN ELECTROPHORESIS, SERUM
Albumin ELP: 3.8 g/dL (ref 3.8–4.8)
Alpha 1: 0.4 g/dL — ABNORMAL HIGH (ref 0.2–0.3)
Alpha 2: 1.1 g/dL — ABNORMAL HIGH (ref 0.5–0.9)
Beta 2: 0.4 g/dL (ref 0.2–0.5)
Beta Globulin: 0.5 g/dL (ref 0.4–0.6)
Gamma Globulin: 0.8 g/dL (ref 0.8–1.7)
Total Protein: 7 g/dL (ref 6.1–8.1)

## 2022-06-29 LAB — PTH, INTACT (ICMA) AND IONIZED CALCIUM
Calcium, Ion: 5.1 mg/dL (ref 4.7–5.5)
Calcium: 9.4 mg/dL (ref 8.6–10.4)
PTH: 32 pg/mL (ref 16–77)

## 2022-06-29 LAB — RPR: RPR Ser Ql: NONREACTIVE

## 2022-06-30 LAB — MULTIPLE MYELOMA PANEL, SERUM
Albumin SerPl Elph-Mcnc: 3.3 g/dL (ref 2.9–4.4)
Albumin/Glob SerPl: 1.1 (ref 0.7–1.7)
Alpha 1: 0.2 g/dL (ref 0.0–0.4)
Alpha2 Glob SerPl Elph-Mcnc: 1.1 g/dL — ABNORMAL HIGH (ref 0.4–1.0)
B-Globulin SerPl Elph-Mcnc: 1.2 g/dL (ref 0.7–1.3)
Gamma Glob SerPl Elph-Mcnc: 0.8 g/dL (ref 0.4–1.8)
Globulin, Total: 3.3 g/dL (ref 2.2–3.9)
IgA/Immunoglobulin A, Serum: 334 mg/dL (ref 64–422)
IgG (Immunoglobin G), Serum: 737 mg/dL (ref 586–1602)
IgM (Immunoglobulin M), Srm: 165 mg/dL (ref 26–217)

## 2022-06-30 LAB — PE AND FLC, SERUM
Ig Kappa Free Light Chain: 40.8 mg/L — ABNORMAL HIGH (ref 3.3–19.4)
Ig Lambda Free Light Chain: 23.9 mg/L (ref 5.7–26.3)
KAPPA/LAMBDA RATIO: 1.71 — ABNORMAL HIGH (ref 0.26–1.65)
Total Protein: 6.6 g/dL (ref 6.0–8.5)

## 2022-07-08 ENCOUNTER — Other Ambulatory Visit: Payer: Self-pay | Admitting: Family

## 2022-07-08 DIAGNOSIS — D500 Iron deficiency anemia secondary to blood loss (chronic): Secondary | ICD-10-CM

## 2022-07-16 ENCOUNTER — Other Ambulatory Visit: Payer: Self-pay | Admitting: Family

## 2022-07-16 DIAGNOSIS — M545 Low back pain, unspecified: Secondary | ICD-10-CM

## 2022-07-20 ENCOUNTER — Telehealth: Payer: Self-pay | Admitting: Internal Medicine

## 2022-07-20 NOTE — Telephone Encounter (Signed)
Pt son called in [place]) to have appt cancelled due to appt with Primary Dr, offered to r/s he stated that he would call back.

## 2022-07-25 ENCOUNTER — Encounter: Payer: Self-pay | Admitting: Family

## 2022-07-25 ENCOUNTER — Ambulatory Visit (INDEPENDENT_AMBULATORY_CARE_PROVIDER_SITE_OTHER): Payer: Medicare Other | Admitting: Family

## 2022-07-25 ENCOUNTER — Other Ambulatory Visit: Payer: Medicare Other

## 2022-07-25 ENCOUNTER — Ambulatory Visit: Payer: Medicare Other | Admitting: Internal Medicine

## 2022-07-25 DIAGNOSIS — I10 Essential (primary) hypertension: Secondary | ICD-10-CM | POA: Diagnosis not present

## 2022-07-25 DIAGNOSIS — D500 Iron deficiency anemia secondary to blood loss (chronic): Secondary | ICD-10-CM | POA: Diagnosis not present

## 2022-07-25 DIAGNOSIS — F419 Anxiety disorder, unspecified: Secondary | ICD-10-CM

## 2022-07-25 DIAGNOSIS — M545 Low back pain, unspecified: Secondary | ICD-10-CM

## 2022-07-25 DIAGNOSIS — R440 Auditory hallucinations: Secondary | ICD-10-CM

## 2022-07-25 DIAGNOSIS — E119 Type 2 diabetes mellitus without complications: Secondary | ICD-10-CM

## 2022-07-25 DIAGNOSIS — F32A Depression, unspecified: Secondary | ICD-10-CM

## 2022-07-25 LAB — CBC WITH DIFFERENTIAL/PLATELET
Basophils Absolute: 0.1 10*3/uL (ref 0.0–0.1)
Basophils Relative: 0.6 % (ref 0.0–3.0)
Eosinophils Absolute: 0 10*3/uL (ref 0.0–0.7)
Eosinophils Relative: 0.2 % (ref 0.0–5.0)
HCT: 38.4 % (ref 36.0–46.0)
Hemoglobin: 12.6 g/dL (ref 12.0–15.0)
Lymphocytes Relative: 7.6 % — ABNORMAL LOW (ref 12.0–46.0)
Lymphs Abs: 1 10*3/uL (ref 0.7–4.0)
MCHC: 32.7 g/dL (ref 30.0–36.0)
MCV: 85.4 fl (ref 78.0–100.0)
Monocytes Absolute: 0.9 10*3/uL (ref 0.1–1.0)
Monocytes Relative: 6.6 % (ref 3.0–12.0)
Neutro Abs: 11.3 10*3/uL — ABNORMAL HIGH (ref 1.4–7.7)
Neutrophils Relative %: 85 % — ABNORMAL HIGH (ref 43.0–77.0)
Platelets: 313 10*3/uL (ref 150.0–400.0)
RBC: 4.5 Mil/uL (ref 3.87–5.11)
RDW: 19.7 % — ABNORMAL HIGH (ref 11.5–15.5)
WBC: 13.3 10*3/uL — ABNORMAL HIGH (ref 4.0–10.5)

## 2022-07-25 MED ORDER — METOPROLOL TARTRATE 25 MG PO TABS: 25.0000 mg | ORAL_TABLET | Freq: Two times a day (BID) | ORAL | 1 refills | Status: DC

## 2022-07-25 MED ORDER — FERROUS SULFATE 325 (65 FE) MG PO TBEC: 325.0000 mg | DELAYED_RELEASE_TABLET | Freq: Every day | ORAL | 0 refills | Status: DC

## 2022-07-25 MED ORDER — DULOXETINE HCL 30 MG PO CPEP: 30.0000 mg | ORAL_CAPSULE | Freq: Every day | ORAL | 1 refills | Status: AC

## 2022-07-25 MED ORDER — AMLODIPINE BESYLATE 5 MG PO TABS: 5.0000 mg | ORAL_TABLET | Freq: Every day | ORAL | 1 refills | Status: DC

## 2022-07-25 NOTE — Assessment & Plan Note (Signed)
Patient canceled appoint with hematology today.  Wife declines any further follow-up with hematology.  Pending CBC, ferritin.  Counseled on how to wean off omeprazole 40 mg.  Patient will stay vigilant for any recurrence of epigastric discomfort or other symptoms suggestive of GERD.  If iron stores are normal, will discontinue ferrous sulfate.

## 2022-07-25 NOTE — Progress Notes (Signed)
NEEDS REFERRAL FOR PODIATRY

## 2022-07-25 NOTE — Assessment & Plan Note (Addendum)
Continues to experience auditory hallucination. Concern for dementia however  bipolar and schizophrenia would need to considered with hallucination and she would need evaluation by psychiatry. Suspect too that symptom aggravated by poor sleep.  Patient declines further evaluation includiing MRI of the brain.  Patient placed declines at this time.  We are starting Cymbalta 30 mg for depression anxiety.  Discussed start Seroquel at follow-up.

## 2022-07-25 NOTE — Assessment & Plan Note (Signed)
Severely uncontrolled. Long discussion as it relates to her being 'willling to go with the good Lord.' She denies any thoughts of harming herself or suicide plan. No h/o suicide attempt.  Start cymbalta. Close follow up.

## 2022-07-25 NOTE — Progress Notes (Signed)
Subjective:    Patient ID: Melanie Bird, female    DOB: [birth year], 81 y.o.   MRN: [id number]  CC: Melanie Bird is a 81 y.o. female who presents today for follow up.   HPI: Accompanied by son No new complaints today.  No fever, dysuria, foul odor to urine.   She has gained weight.   She showers once per week.  Son describes her sitting all day.   Low back pain- unchanged. No falls. No groin pain, numbness.  "Pain is not bad all the time'. She sits most of day. Using tylenol arthritis twice daily BID. Right leg feels weak with tingling.      HTN- compliant with amlodipine 5 mg, metoprolol tartrate '25mg'$  bid  DM- compliant with metformin '500mg'$  qd  Depression- She is not taking trazodone '100mg'$ . Son is interested in cymbalta. She is worried about her sister and tearful speaking of her as she is in rehab facility. She hears sounds through the TV, she is hears a tape that comes through. She has trouble falling asleep.  No suicide plan or attempt at suicide. No hi. She is ready when 'the good lord wants to take her.' She has a strong faith.   IDA-compliant with ferrous sulfate 325 mg twice daily, omeprazole 40 mg daily.  Appointment has been scheduled with hematology for today but it appears patient has canceled   HISTORY:  Past Medical History:  Diagnosis Date   Anemia    GERD (gastroesophageal reflux disease)    Hypertension    Past Surgical History:  Procedure Laterality Date   PARATHYROIDECTOMY     09/2018   Family History  Problem Relation Age of Onset   Diabetes Father    Hypertension Sister    Breast cancer Neg Hx     Allergies: Patient has no known allergies. Current Outpatient Medications on File Prior to Visit  Medication Sig Dispense Refill   acetaminophen (TYLENOL 8 HOUR ARTHRITIS PAIN) 650 MG CR tablet Take 1 tablet (650 mg total) by mouth 2 (two) times daily. 180 tablet 2   B Complex Vitamins (B COMPLEX 1 PO) Take 1 tablet by mouth daily.      latanoprost (XALATAN) 0.005 % ophthalmic solution Place 1 drop into both eyes at bedtime.     ondansetron (ZOFRAN) 4 MG tablet Take 1 tablet (4 mg total) by mouth every 6 (six) hours as needed for nausea. 20 tablet 0   SIMBRINZA 1-0.2 % SUSP      No current facility-administered medications on file prior to visit.    Social History   Tobacco Use   Smoking status: Never   Smokeless tobacco: Never  Substance Use Topics   Alcohol use: No   Drug use: No    Review of Systems  Constitutional:  Negative for chills and fever.  Respiratory:  Negative for cough.   Cardiovascular:  Negative for chest pain and palpitations.  Gastrointestinal:  Negative for nausea and vomiting.  Psychiatric/Behavioral:  Positive for hallucinations and sleep disturbance. Negative for suicidal ideas. The patient is nervous/anxious.       Objective:    BP 130/82 (BP Location: Left Arm, Patient Position: Sitting, Cuff Size: Normal)   Pulse 71   Temp 98.4 F (36.9 C) (Oral)   Ht '4\' 11"'$  (1.499 m)   Wt 179 lb (81.2 kg)   SpO2 96%   BMI 36.15 kg/m  BP Readings from Last 3 Encounters:  07/25/22 130/82  06/24/22 128/82  05/18/22 Marland Kitchen)  147/68   Wt Readings from Last 3 Encounters:  07/25/22 179 lb (81.2 kg)  06/24/22 166 lb 6.4 oz (75.5 kg)  05/12/22 167 lb 12.3 oz (76.1 kg)    Physical Exam Vitals reviewed.  Constitutional:      Appearance: She is well-developed.  Eyes:     Conjunctiva/sclera: Conjunctivae normal.  Cardiovascular:     Rate and Rhythm: Normal rate and regular rhythm.     Pulses: Normal pulses.     Heart sounds: Normal heart sounds.  Pulmonary:     Effort: Pulmonary effort is normal.     Breath sounds: Normal breath sounds. No wheezing, rhonchi or rales.  Musculoskeletal:     Comments: Right lower extremity decreased sensation to microfilament up to mid calf.strength 4/5 RLE.   Skin:    General: Skin is warm and dry.  Neurological:     Mental Status: She is alert.     Comments:  Alert and oriented x 3. Disoriented to time  Psychiatric:        Speech: Speech normal.        Behavior: Behavior normal.        Thought Content: Thought content normal.        Assessment & Plan:   Problem List Items Addressed This Visit       Cardiovascular and Mediastinum   HTN (hypertension)    Chronic, stable. Continue amlodipine 5 mg, metoprolol tartrate '25mg'$  bid      Relevant Medications   amLODipine (NORVASC) 5 MG tablet   metoprolol tartrate (LOPRESSOR) 25 MG tablet     Endocrine   Diabetes mellitus without complication (HCC)    Lab Results  Component Value Date   HGBA1C 5.6 05/12/2022  Excellent control. Continue metformin '500mg'$  qd.         Other   Anxiety and depression    Severely uncontrolled. Long discussion as it relates to her being 'willling to go with the good Lord.' She denies any thoughts of harming herself or suicide plan. No h/o suicide attempt.  Start cymbalta. Close follow up.       Relevant Medications   DULoxetine (CYMBALTA) 30 MG capsule   Auditory hallucination    Continues to experience auditory hallucination. Concern for dementia however  bipolar and schizophrenia would need to considered with hallucination and she would need evaluation by psychiatry. Suspect too that symptom aggravated by poor sleep.  Patient declines further evaluation includiing MRI of the brain.  Patient placed declines at this time.  We are starting Cymbalta 30 mg for depression anxiety.  Discussed start Seroquel at follow-up.      Iron deficiency anemia due to chronic blood loss    Patient canceled appoint with hematology today.  Wife declines any further follow-up with hematology.  Pending CBC, ferritin.  Counseled on how to wean off omeprazole 40 mg.  Patient will stay vigilant for any recurrence of epigastric discomfort or other symptoms suggestive of GERD.  If iron stores are normal, will discontinue ferrous sulfate.      Relevant Medications   ferrous sulfate  325 (65 FE) MG EC tablet   Other Relevant Orders   CBC with Differential/Platelet   IBC + Ferritin   Low back pain    Strongly advised MRI lumbar spine to further investigate right leg numbness and weakness.  Patient declines MRI as well as x-ray which we could obtain in the office today. Patient would likely benefit from home health with PT, OT, she declines as  well. Follow closely.       Other Visit Diagnoses     Essential hypertension       Relevant Medications   amLODipine (NORVASC) 5 MG tablet   metoprolol tartrate (LOPRESSOR) 25 MG tablet        I have discontinued Melanie Bird. Melanie Bird's tiZANidine and omeprazole. I have also changed her ferrous sulfate, amLODipine, and metoprolol tartrate. Additionally, I am having her start on DULoxetine. Lastly, I am having her maintain her latanoprost, Simbrinza, ondansetron, acetaminophen, and B Complex Vitamins (B COMPLEX 1 PO).   Meds ordered this encounter  Medications   ferrous sulfate 325 (65 FE) MG EC tablet    Sig: Take 1 tablet (325 mg total) by mouth daily with breakfast.    Dispense:  60 tablet    Refill:  0    Order Specific Question:   Supervising Provider    Answer:   Deborra Medina L [[id number]]   amLODipine (NORVASC) 5 MG tablet    Sig: Take 1 tablet (5 mg total) by mouth daily.    Dispense:  90 tablet    Refill:  1    Order Specific Question:   Supervising Provider    Answer:   Deborra Medina L [[id number]]   metoprolol tartrate (LOPRESSOR) 25 MG tablet    Sig: Take 1 tablet (25 mg total) by mouth 2 (two) times daily.    Dispense:  60 tablet    Refill:  1    Order Specific Question:   Supervising Provider    Answer:   Deborra Medina L [[id number]]   DULoxetine (CYMBALTA) 30 MG capsule    Sig: Take 1 capsule (30 mg total) by mouth daily.    Dispense:  90 capsule    Refill:  1    Order Specific Question:   Supervising Provider    Answer:   Crecencio Mc [[id number]]    Return precautions given.   Risks, benefits, and alternatives of  the medications and treatment plan prescribed today were discussed, and patient expressed understanding.   Education regarding symptom management and diagnosis given to patient on AVS.  Continue to follow with Burnard Hawthorne, FNP for routine health maintenance.   Marjory Lies and I agreed with plan.   Mable Paris, FNP

## 2022-07-25 NOTE — Assessment & Plan Note (Signed)
Lab Results  Component Value Date   HGBA1C 5.6 05/12/2022   Excellent control. Continue metformin '500mg'$  qd.

## 2022-07-25 NOTE — Patient Instructions (Addendum)
Take omeprazole '20mg'$  ( you had been on '40mg'$ ) once daily for 5 days, then stop.  Please monitor for any symptoms such as epigastric discomfort and burning, belching which may indicate acid reflux   You may take ferrous sulfate 1 capsule/day until iron results.  His iron and blood work is normal, we will discontinue ferrous sulfate. Start Cymbalta.  Please let me know how you are doing.  Please let me know if you would consider imaging of low back and brain imaging.  Please return stool cards

## 2022-07-25 NOTE — Assessment & Plan Note (Signed)
Chronic, stable. Continue amlodipine 5 mg, metoprolol tartrate '25mg'$  bid

## 2022-07-25 NOTE — Assessment & Plan Note (Addendum)
Strongly advised MRI lumbar spine to further investigate right leg numbness and weakness.  Patient declines MRI as well as x-ray which we could obtain in the office today. Patient would likely benefit from home health with PT, OT, she declines as well. Follow closely.

## 2022-07-26 ENCOUNTER — Other Ambulatory Visit: Payer: Self-pay | Admitting: Family

## 2022-07-26 LAB — IBC + FERRITIN
Ferritin: 25.6 ng/mL (ref 10.0–291.0)
Iron: 81 ug/dL (ref 42–145)
Saturation Ratios: 20.4 % (ref 20.0–50.0)
TIBC: 396.2 ug/dL (ref 250.0–450.0)
Transferrin: 283 mg/dL (ref 212.0–360.0)

## 2022-07-27 LAB — ERYTHROPOIETIN: Erythropoietin: 25.7 m[IU]/mL — ABNORMAL HIGH (ref 2.6–18.5)

## 2022-07-27 LAB — RETICULOCYTES
ABS Retic: 46700 cells/uL (ref 20000–80000)
Retic Ct Pct: 1 %

## 2022-07-31 ENCOUNTER — Other Ambulatory Visit: Payer: Self-pay | Admitting: Family

## 2022-08-04 ENCOUNTER — Encounter: Payer: Self-pay | Admitting: Family

## 2022-08-05 ENCOUNTER — Other Ambulatory Visit: Payer: Self-pay | Admitting: Family

## 2022-08-05 DIAGNOSIS — R440 Auditory hallucinations: Secondary | ICD-10-CM

## 2022-08-05 MED ORDER — QUETIAPINE FUMARATE 25 MG PO TABS: 25.0000 mg | ORAL_TABLET | Freq: Every day | ORAL | 1 refills | Status: DC

## 2022-08-09 ENCOUNTER — Telehealth: Payer: Medicare Other | Admitting: Family

## 2022-08-12 ENCOUNTER — Telehealth: Payer: Self-pay | Admitting: Family

## 2022-08-12 NOTE — Telephone Encounter (Signed)
Copied from [place] [phone]. Topic: Medicare AWV >> Aug 12, 2022 10:08 AM Devoria Glassing wrote: Reason for HTM:BPJPET patient to schedule Annual Wellness Visit.  Please schedule with Nurse Health Advisor Denisa O'Brien-Blaney, LPN at [facility]. This appt can be telephone or office visit.  Please call [phone] ask for [facility]

## 2022-08-23 ENCOUNTER — Other Ambulatory Visit: Payer: Self-pay | Admitting: Family

## 2022-08-30 ENCOUNTER — Telehealth: Payer: Self-pay | Admitting: Family

## 2022-08-30 NOTE — Telephone Encounter (Signed)
Copied from [place] [phone]. Topic: Medicare AWV >> Aug 30, 2022  2:11 PM Devoria Glassing wrote: Reason for CRM: Left message for patient to schedule Annual Wellness Visit.  Please schedule with Nurse Health Advisor Denisa O'Brien-Blaney, LPN at [facility]. This appt can be telephone or office visit.  Please call [phone] ask for [facility]

## 2022-08-30 NOTE — Telephone Encounter (Signed)
Copied from [place] [phone]. Topic: Medicare AWV >> Aug 30, 2022 10:23 AM Devoria Glassing wrote: Reason for CRM: Left message for patient to schedule Annual Wellness Visit.  Please schedule with Nurse Health Advisor Denisa O'Brien-Blaney, LPN at [facility]. This appt can be telephone or office visit.  Please call [phone] ask for [facility]

## 2022-09-15 ENCOUNTER — Other Ambulatory Visit: Payer: Self-pay | Admitting: Family

## 2022-09-30 ENCOUNTER — Other Ambulatory Visit: Payer: Self-pay | Admitting: Family

## 2022-10-11 ENCOUNTER — Telehealth: Payer: Self-pay | Admitting: Family

## 2022-10-11 NOTE — Telephone Encounter (Signed)
Req CB 09/2023 per pt son

## 2023-01-27 ENCOUNTER — Other Ambulatory Visit: Payer: Self-pay | Admitting: Family

## 2023-01-27 DIAGNOSIS — R440 Auditory hallucinations: Secondary | ICD-10-CM

## 2023-02-23 ENCOUNTER — Telehealth: Payer: Self-pay | Admitting: Family

## 2023-02-23 NOTE — Telephone Encounter (Signed)
Copied from [place] [phone]. Topic: Medicare AWV >> Feb 23, 2023 10:35 AM Lollie Marrow wrote: Reason for CRM: Called patient to schedule Medicare Annual Wellness Visit (AWV). Left message for patient to call back and schedule Medicare Annual Wellness Visit (AWV).  Last date of AWV: 07/16/2020  Please schedule an appointment at any time with Denisa, [place].  If any questions, please contact me at [phone].    Thank you,  [place] Direct dial  [phone]

## 2023-04-14 ENCOUNTER — Other Ambulatory Visit: Payer: Self-pay | Admitting: Family

## 2023-04-14 DIAGNOSIS — I10 Essential (primary) hypertension: Secondary | ICD-10-CM

## 2023-07-13 ENCOUNTER — Other Ambulatory Visit: Payer: Self-pay | Admitting: Family

## 2023-07-13 DIAGNOSIS — I10 Essential (primary) hypertension: Secondary | ICD-10-CM

## 2023-07-24 ENCOUNTER — Telehealth: Payer: Self-pay | Admitting: Family

## 2023-07-24 NOTE — Telephone Encounter (Addendum)
Pt daughter came into the office to drop off FMLA paperwork from her job for the provider to fill out. Places in provider folder up front Pt daughter# [phone] ext [phone]

## 2023-07-25 NOTE — Telephone Encounter (Signed)
Paperwork has been placed in provider folder for review and sinature

## 2023-08-02 NOTE — Telephone Encounter (Signed)
Spoke to pt daughter and she needs clarification of FMLA paperwork in regards to is the FMLA for 1 yr or 6 mnths? Does not understand the back page where it says 2x's per week for 1 hr over the next 6 mnths. Pt states that Mom is almost wheelchair bound and has dementia. Please explain

## 2023-08-02 NOTE — Telephone Encounter (Signed)
Spoke to Melanie Bird and explained  FMLA paperwork told her if she needs anything  else specified just give [place] a call

## 2023-08-02 NOTE — Telephone Encounter (Signed)
Pt daughter Dorene Grebe called in stating that she does not understant the FMLA paperwork and would like for a nurse to explain them to her. Her best contact is [phone] ext [phone].

## 2023-08-17 ENCOUNTER — Telehealth: Payer: Self-pay | Admitting: Family

## 2023-08-17 NOTE — Telephone Encounter (Signed)
Copied from CRM [phone]. Topic: Medicare AWV >> Aug 17, 2023 11:40 AM Payton Doughty wrote: Reason for CRM: Called 08/17/2023 to sched AWV   Verlee Rossetti; Care Guide Ambulatory Clinical Support  l [facility] Direct Dial: [phone]

## 2023-08-17 NOTE — Telephone Encounter (Signed)
ERROR

## 2023-10-19 ENCOUNTER — Other Ambulatory Visit: Payer: Self-pay | Admitting: Family

## 2023-10-19 DIAGNOSIS — I10 Essential (primary) hypertension: Secondary | ICD-10-CM

## 2023-10-20 NOTE — Telephone Encounter (Signed)
LMTCB. Need to schedule an appt for refills.

## 2023-11-29 ENCOUNTER — Telehealth: Payer: Self-pay | Admitting: *Deleted

## 2023-11-29 NOTE — Telephone Encounter (Signed)
LVM to call back and schedule BP and Diabetes before 12/20/23 if we can.

## 2023-11-29 NOTE — Telephone Encounter (Signed)
Patient needs follow up please call an schedule BP and Diabetes before 12/20/23 if we  can.

## 2023-12-01 NOTE — Telephone Encounter (Signed)
Spoke to pt Son and he stated that she is DONE with everything because she has Demetia and they can not get her to go to any appts and she is in a wheelchair

## 2023-12-04 NOTE — Telephone Encounter (Signed)
noted 

## 2023-12-21 ENCOUNTER — Encounter: Payer: Self-pay | Admitting: Internal Medicine

## 2023-12-21 NOTE — Telephone Encounter (Signed)
 Created in error

## 2025-01-15 ENCOUNTER — Ambulatory Visit: Payer: Self-pay

## 2025-01-15 DIAGNOSIS — F419 Anxiety disorder, unspecified: Secondary | ICD-10-CM

## 2025-01-15 DIAGNOSIS — I10 Essential (primary) hypertension: Secondary | ICD-10-CM

## 2025-01-15 DIAGNOSIS — R440 Auditory hallucinations: Secondary | ICD-10-CM

## 2025-01-15 NOTE — Telephone Encounter (Signed)
 FYI Only or Action Required?: FYI only for provider: ED advised.  Patient was last seen in primary care on 07/25/2022 by Dineen Rollene MATSU, FNP.  Called Nurse Triage reporting Fatigue and Extremity Weakness.  Symptoms began several days ago.  Interventions attempted: Rest, hydration, or home remedies.  Symptoms are: gradually worsening.  Triage Disposition: Go to ED Now (Notify PCP)  Patient/caregiver understands and will follow disposition?: Yes         Reason for Disposition  Difficulty breathing  Answer Assessment - Initial Assessment Questions Patient son Melanie Bird )  reports does not currently have PCP last saw [facility] 2023. Wants to re-establish with them.  back in 2023  got dementia diagnosis quit going to PCP and taking medicine . Lives with sons sister later 2 years wheelchair , has been doing bathroom habits and make meals but getting to point now weaker. Not eating much at times,  not drinking. Not able to get up down by herself in last few days . Hallucinations. Shortness of breath has gotten worse as well. Wants to hold off on appointment scheduling with PCP to reestablish care agees with ER being next step and at ER will discuss if patient qualifies for hospice care and see what ER provider think its best.  When reviewing next opening with [name] it was next month FYI back to office patient  son taking patient to ER          6. OTHER SYMPTOMS: Do you have any other symptoms? (e.g., chest pain, fever, cough, SOB, vomiting, diarrhea, bleeding, other areas of pain)  Per son shortness of breath that has gotten worse in past few days , hallucinations, increased weakness not able to get out of wheelchair on own anymore not wanting to eat or drink  Protocols used: Weakness (Generalized) and Fatigue-A-AH  Message from Melanie Bird sent at 01/15/2025 12:47 PM EST  Reason for Triage: weakness, loss of appetite, dizziness, shortness of breath and loss of  balance. in late stages of dementia. Wants to reestablish to [place] [facility] (Dr. Dineen)

## 2025-01-16 NOTE — Telephone Encounter (Signed)
 Spoke with pt's son and he stated that an appt will not work. He stated that his mother is not eating or drinking anymore and sleeping all the time. He stated that it seems like his mother is done. He would like to have a hospice referral placed.

## 2025-01-16 NOTE — Telephone Encounter (Signed)
 Called pt son he states she is declining and he wants to get her some type of help such as hospice or a hospital bed at home. He also states she is at the end of her dementia stage & dr says she has about less then 6 month or so to live. She isn't able to come into the office because she is so week so I offered him a video visit with his mother he said that my be a possibility. Couldn't schedule a date because him and his sister watches over their mother plus work and didn't know when they were going to be available. I stated to him our office will keep in touch

## 2025-01-17 NOTE — Telephone Encounter (Signed)
 Urgent ref for hospice consult How soon can she be seen? First available agency
